# Patient Record
Sex: Male | Born: 1955 | Race: White | Hispanic: No | Marital: Married | Smoking: Never smoker
Health system: Southern US, Community
[De-identification: ages and names within clinical notes are randomized; demographics above are authoritative.]

## PROBLEM LIST (undated history)

## (undated) DIAGNOSIS — F419 Anxiety disorder, unspecified: Secondary | ICD-10-CM

## (undated) DIAGNOSIS — Z9289 Personal history of other medical treatment: Secondary | ICD-10-CM

## (undated) DIAGNOSIS — Q211 Atrial septal defect: Secondary | ICD-10-CM

## (undated) DIAGNOSIS — M1990 Unspecified osteoarthritis, unspecified site: Secondary | ICD-10-CM

## (undated) DIAGNOSIS — E785 Hyperlipidemia, unspecified: Secondary | ICD-10-CM

## (undated) DIAGNOSIS — I2510 Atherosclerotic heart disease of native coronary artery without angina pectoris: Secondary | ICD-10-CM

## (undated) DIAGNOSIS — E039 Hypothyroidism, unspecified: Secondary | ICD-10-CM

## (undated) DIAGNOSIS — I82409 Acute embolism and thrombosis of unspecified deep veins of unspecified lower extremity: Secondary | ICD-10-CM

## (undated) DIAGNOSIS — I209 Angina pectoris, unspecified: Secondary | ICD-10-CM

## (undated) DIAGNOSIS — F909 Attention-deficit hyperactivity disorder, unspecified type: Secondary | ICD-10-CM

## (undated) DIAGNOSIS — I10 Essential (primary) hypertension: Secondary | ICD-10-CM

## (undated) DIAGNOSIS — N183 Chronic kidney disease, stage 3 unspecified: Secondary | ICD-10-CM

## (undated) DIAGNOSIS — K219 Gastro-esophageal reflux disease without esophagitis: Secondary | ICD-10-CM

## (undated) DIAGNOSIS — R569 Unspecified convulsions: Secondary | ICD-10-CM

## (undated) DIAGNOSIS — G629 Polyneuropathy, unspecified: Secondary | ICD-10-CM

## (undated) DIAGNOSIS — Z86711 Personal history of pulmonary embolism: Secondary | ICD-10-CM

## (undated) DIAGNOSIS — G060 Intracranial abscess and granuloma: Secondary | ICD-10-CM

## (undated) DIAGNOSIS — Z8739 Personal history of other diseases of the musculoskeletal system and connective tissue: Secondary | ICD-10-CM

## (undated) DIAGNOSIS — G4730 Sleep apnea, unspecified: Secondary | ICD-10-CM

## (undated) DIAGNOSIS — Q2112 Patent foramen ovale: Secondary | ICD-10-CM

## (undated) HISTORY — DX: Anxiety disorder, unspecified: F41.9

## (undated) HISTORY — PX: CRANIOTOMY: SHX93

## (undated) HISTORY — DX: Attention-deficit hyperactivity disorder, unspecified type: F90.9

## (undated) HISTORY — PX: PATENT FORAMEN OVALE CLOSURE: SHX2181

## (undated) HISTORY — PX: ULNAR TUNNEL RELEASE: SHX820

## (undated) HISTORY — DX: Acute embolism and thrombosis of unspecified deep veins of unspecified lower extremity: I82.409

## (undated) HISTORY — PX: ANTERIOR CRUCIATE LIGAMENT REPAIR: SHX115

## (undated) HISTORY — DX: Essential (primary) hypertension: I10

## (undated) HISTORY — PX: BRAIN SURGERY: SHX531

## (undated) HISTORY — PX: OTHER SURGICAL HISTORY: SHX169

## (undated) HISTORY — PX: CARPAL TUNNEL RELEASE: SHX101

## (undated) HISTORY — DX: Hyperlipidemia, unspecified: E78.5

## (undated) HISTORY — DX: Gastro-esophageal reflux disease without esophagitis: K21.9

## (undated) HISTORY — PX: FRACTURE SURGERY: SHX138

## (undated) HISTORY — DX: Personal history of pulmonary embolism: Z86.711

---

## 1998-09-06 ENCOUNTER — Emergency Department (HOSPITAL_COMMUNITY): Admission: EM | Admit: 1998-09-06 | Discharge: 1998-09-06 | Payer: Self-pay | Admitting: Emergency Medicine

## 1998-09-06 ENCOUNTER — Encounter: Payer: Self-pay | Admitting: Emergency Medicine

## 1998-10-17 ENCOUNTER — Encounter: Payer: Self-pay | Admitting: Internal Medicine

## 1998-10-17 ENCOUNTER — Emergency Department (HOSPITAL_COMMUNITY): Admission: EM | Admit: 1998-10-17 | Discharge: 1998-10-17 | Payer: Self-pay | Admitting: Internal Medicine

## 1999-12-12 ENCOUNTER — Encounter: Payer: Self-pay | Admitting: Family Medicine

## 1999-12-12 ENCOUNTER — Encounter: Admission: RE | Admit: 1999-12-12 | Discharge: 1999-12-12 | Payer: Self-pay | Admitting: Family Medicine

## 2003-12-02 ENCOUNTER — Emergency Department (HOSPITAL_COMMUNITY): Admission: EM | Admit: 2003-12-02 | Discharge: 2003-12-03 | Payer: Self-pay | Admitting: Emergency Medicine

## 2003-12-26 ENCOUNTER — Observation Stay (HOSPITAL_COMMUNITY): Admission: EM | Admit: 2003-12-26 | Discharge: 2003-12-27 | Payer: Self-pay | Admitting: Emergency Medicine

## 2004-06-16 ENCOUNTER — Ambulatory Visit (HOSPITAL_COMMUNITY): Admission: RE | Admit: 2004-06-16 | Discharge: 2004-06-16 | Payer: Self-pay | Admitting: Allergy

## 2005-02-27 ENCOUNTER — Ambulatory Visit: Payer: Self-pay | Admitting: Internal Medicine

## 2005-03-09 ENCOUNTER — Ambulatory Visit (HOSPITAL_COMMUNITY): Admission: RE | Admit: 2005-03-09 | Discharge: 2005-03-09 | Payer: Self-pay | Admitting: Internal Medicine

## 2005-03-19 ENCOUNTER — Ambulatory Visit (HOSPITAL_BASED_OUTPATIENT_CLINIC_OR_DEPARTMENT_OTHER): Admission: RE | Admit: 2005-03-19 | Discharge: 2005-03-19 | Payer: Self-pay | Admitting: Internal Medicine

## 2005-03-22 ENCOUNTER — Ambulatory Visit: Payer: Self-pay | Admitting: Internal Medicine

## 2005-04-02 ENCOUNTER — Ambulatory Visit: Payer: Self-pay | Admitting: Internal Medicine

## 2005-06-22 ENCOUNTER — Ambulatory Visit: Admission: RE | Admit: 2005-06-22 | Discharge: 2005-06-22 | Payer: Self-pay | Admitting: Orthopedic Surgery

## 2005-08-20 ENCOUNTER — Ambulatory Visit: Payer: Self-pay | Admitting: Internal Medicine

## 2005-09-03 ENCOUNTER — Ambulatory Visit (HOSPITAL_COMMUNITY): Admission: RE | Admit: 2005-09-03 | Discharge: 2005-09-04 | Payer: Self-pay | Admitting: Orthopedic Surgery

## 2005-12-19 ENCOUNTER — Encounter: Payer: Self-pay | Admitting: Emergency Medicine

## 2005-12-20 ENCOUNTER — Inpatient Hospital Stay (HOSPITAL_COMMUNITY): Admission: EM | Admit: 2005-12-20 | Discharge: 2005-12-30 | Payer: Self-pay | Admitting: Emergency Medicine

## 2005-12-22 ENCOUNTER — Ambulatory Visit: Payer: Self-pay | Admitting: Infectious Diseases

## 2005-12-22 ENCOUNTER — Encounter (INDEPENDENT_AMBULATORY_CARE_PROVIDER_SITE_OTHER): Payer: Self-pay | Admitting: Cardiology

## 2005-12-31 ENCOUNTER — Ambulatory Visit: Payer: Self-pay | Admitting: Pulmonary Disease

## 2005-12-31 ENCOUNTER — Inpatient Hospital Stay (HOSPITAL_COMMUNITY): Admission: EM | Admit: 2005-12-31 | Discharge: 2006-01-16 | Payer: Self-pay | Admitting: Emergency Medicine

## 2006-01-01 ENCOUNTER — Encounter (INDEPENDENT_AMBULATORY_CARE_PROVIDER_SITE_OTHER): Payer: Self-pay | Admitting: Cardiology

## 2006-01-07 ENCOUNTER — Encounter: Payer: Self-pay | Admitting: Vascular Surgery

## 2006-01-11 ENCOUNTER — Ambulatory Visit: Payer: Self-pay | Admitting: Hematology & Oncology

## 2006-01-12 ENCOUNTER — Encounter (INDEPENDENT_AMBULATORY_CARE_PROVIDER_SITE_OTHER): Payer: Self-pay | Admitting: Cardiology

## 2006-01-20 ENCOUNTER — Emergency Department (HOSPITAL_COMMUNITY): Admission: EM | Admit: 2006-01-20 | Discharge: 2006-01-20 | Payer: Self-pay | Admitting: Emergency Medicine

## 2006-01-29 ENCOUNTER — Ambulatory Visit (HOSPITAL_COMMUNITY): Admission: RE | Admit: 2006-01-29 | Discharge: 2006-01-29 | Payer: Self-pay | Admitting: Neurosurgery

## 2006-02-11 ENCOUNTER — Encounter: Admission: RE | Admit: 2006-02-11 | Discharge: 2006-05-12 | Payer: Self-pay | Admitting: Family Medicine

## 2006-04-22 ENCOUNTER — Ambulatory Visit (HOSPITAL_COMMUNITY): Admission: RE | Admit: 2006-04-22 | Discharge: 2006-04-22 | Payer: Self-pay | Admitting: Neurosurgery

## 2006-06-16 ENCOUNTER — Ambulatory Visit (HOSPITAL_COMMUNITY): Admission: RE | Admit: 2006-06-16 | Discharge: 2006-06-16 | Payer: Self-pay | Admitting: Family Medicine

## 2006-06-16 ENCOUNTER — Encounter: Payer: Self-pay | Admitting: Vascular Surgery

## 2006-07-19 ENCOUNTER — Ambulatory Visit (HOSPITAL_COMMUNITY): Admission: RE | Admit: 2006-07-19 | Discharge: 2006-07-19 | Payer: Self-pay | Admitting: Neurosurgery

## 2006-07-28 ENCOUNTER — Ambulatory Visit (HOSPITAL_COMMUNITY): Admission: RE | Admit: 2006-07-28 | Discharge: 2006-07-28 | Payer: Self-pay | Admitting: Neurosurgery

## 2006-10-20 ENCOUNTER — Ambulatory Visit (HOSPITAL_COMMUNITY): Admission: RE | Admit: 2006-10-20 | Discharge: 2006-10-20 | Payer: Self-pay | Admitting: Neurosurgery

## 2007-01-27 ENCOUNTER — Ambulatory Visit: Payer: Self-pay | Admitting: Family Medicine

## 2007-01-28 DIAGNOSIS — N183 Chronic kidney disease, stage 3 unspecified: Secondary | ICD-10-CM | POA: Insufficient documentation

## 2007-01-28 DIAGNOSIS — I10 Essential (primary) hypertension: Secondary | ICD-10-CM | POA: Insufficient documentation

## 2007-01-28 DIAGNOSIS — G4733 Obstructive sleep apnea (adult) (pediatric): Secondary | ICD-10-CM | POA: Insufficient documentation

## 2007-02-02 ENCOUNTER — Encounter (INDEPENDENT_AMBULATORY_CARE_PROVIDER_SITE_OTHER): Payer: Self-pay | Admitting: *Deleted

## 2007-03-28 ENCOUNTER — Ambulatory Visit (HOSPITAL_COMMUNITY): Admission: RE | Admit: 2007-03-28 | Discharge: 2007-03-28 | Payer: Self-pay | Admitting: Neurosurgery

## 2007-05-15 IMAGING — CT CT HEAD W/O CM
3 of 5 series · 16 of 47 positions shown, 19 images · IV contrast (APPLIED)
Comparison: 12/24/05.

CLINICAL DATA: Shortness of breath.  Weakness. 
 HEAD CT WITHOUT CONTRAST:
TECHNIQUE: Contiguous axial images were obtained from the base of the skull through the vertex according to standard protocol without contrast.

[Series 9: pulm embolism 2.0 st · axial · 0.62mm/px · z∈[-652,-366]mm · 10 of 170 slices shown, 13 images (1 of 3)]
[im 14/170  brain]
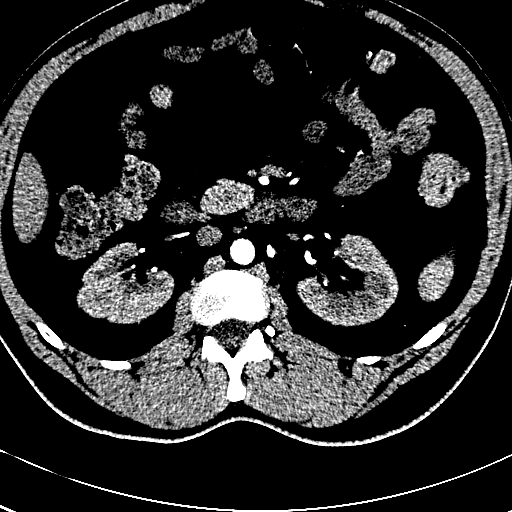
[im 14/170  bone]
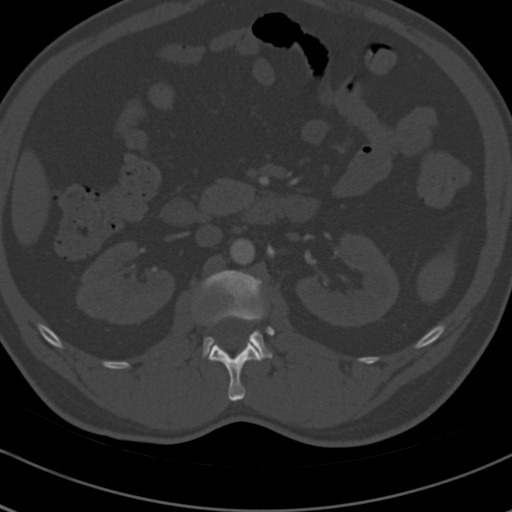
[im 27/170  brain]
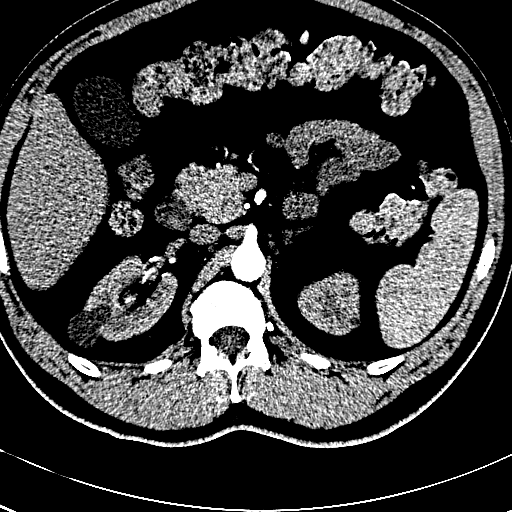
[im 53/170  brain]
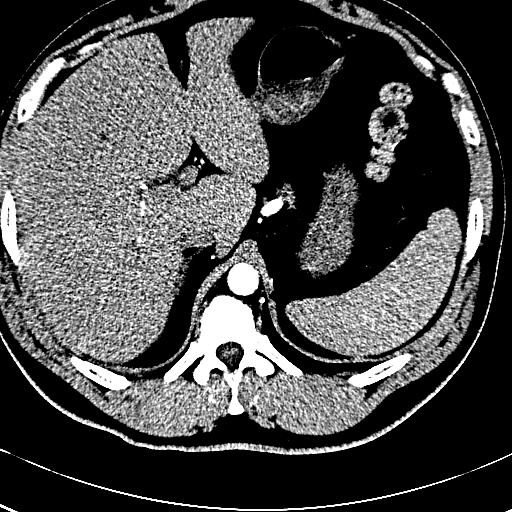
[im 66/170  brain]
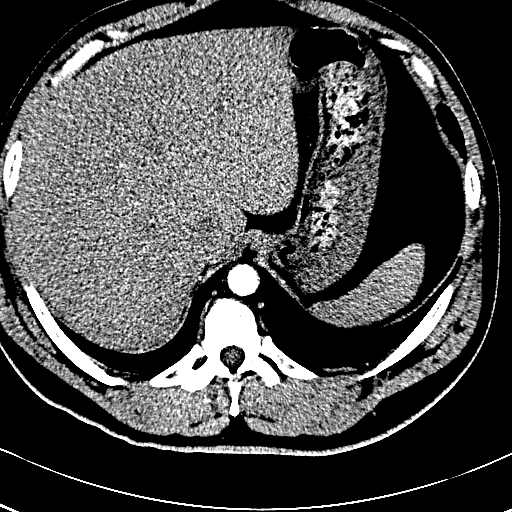
[im 79/170  brain]
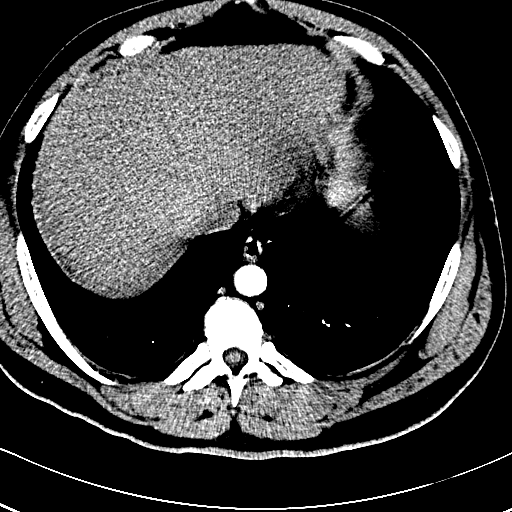
[im 79/170  bone]
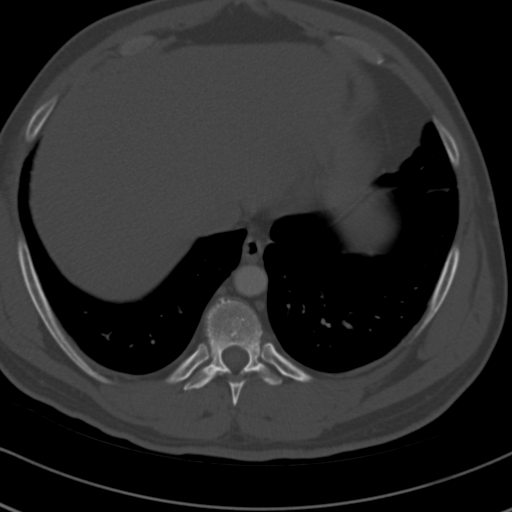
[im 92/170  brain]
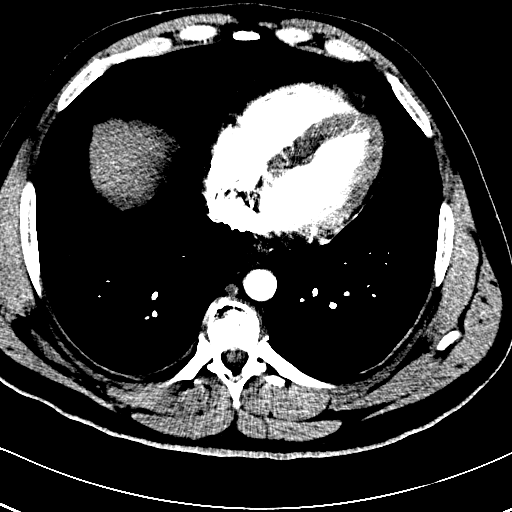
[im 105/170  brain]
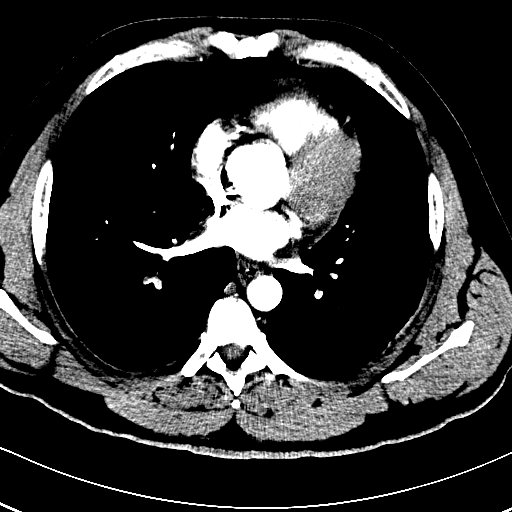
[im 131/170  brain]
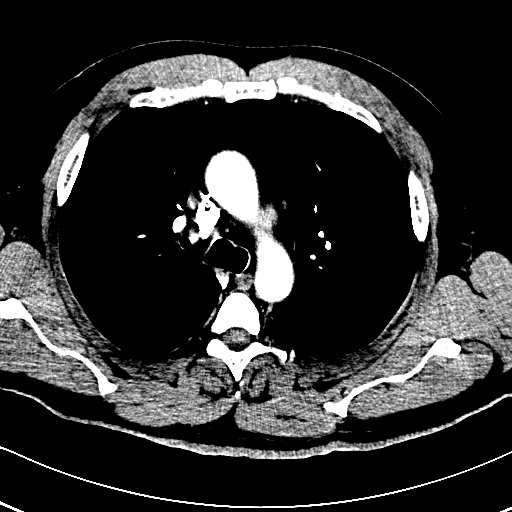
[im 144/170  brain]
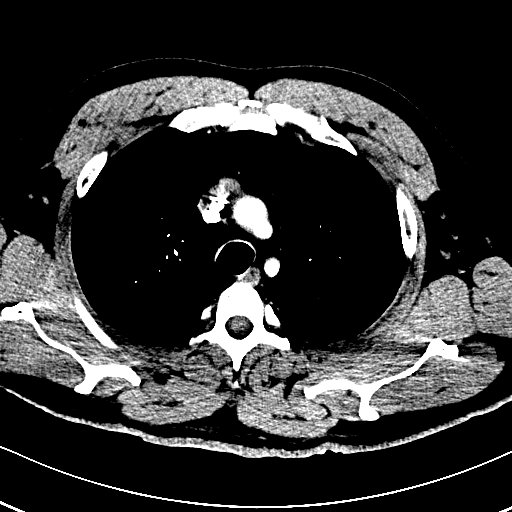
[im 144/170  bone]
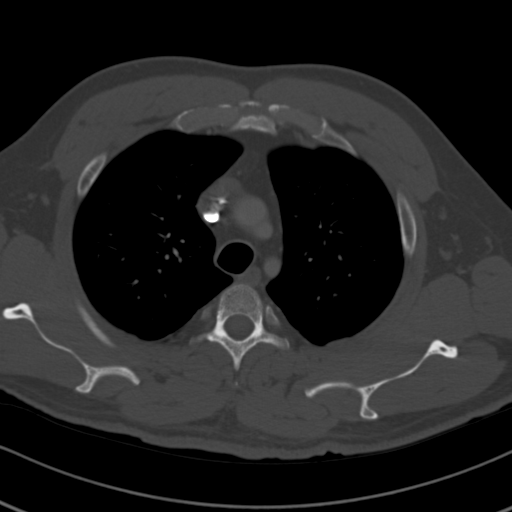
[im 157/170  brain]
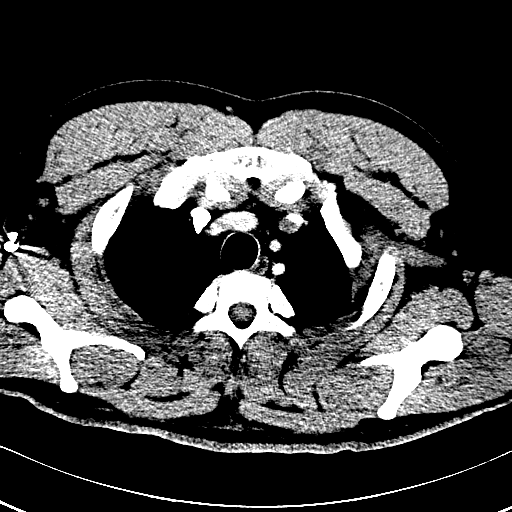

[Series 15: pulm embolism 2.0 st · coronal · 0.66mm/px · 3 of 129 slices shown (2 of 3)]
[im 43/129  brain]
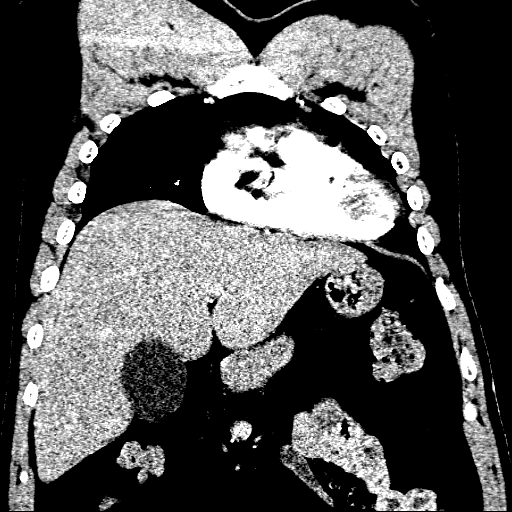
[im 57/129  brain]
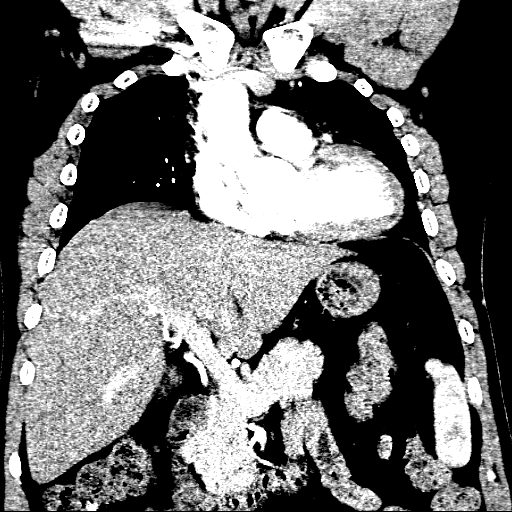
[im 72/129  brain]
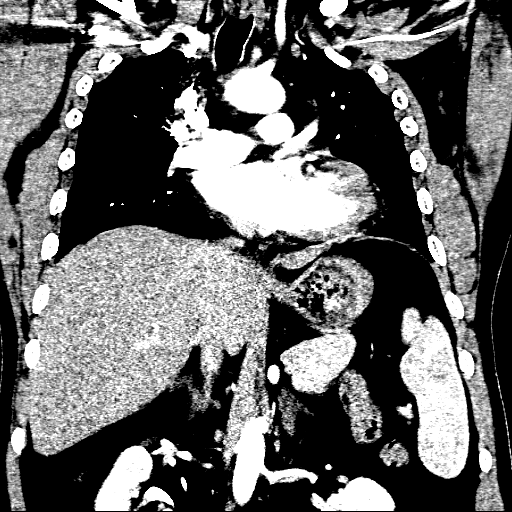

[Series 16: pulm embolism 2.0 st · sagittal · 0.66mm/px · 3 of 142 slices shown (3 of 3)]
[im 48/142  brain]
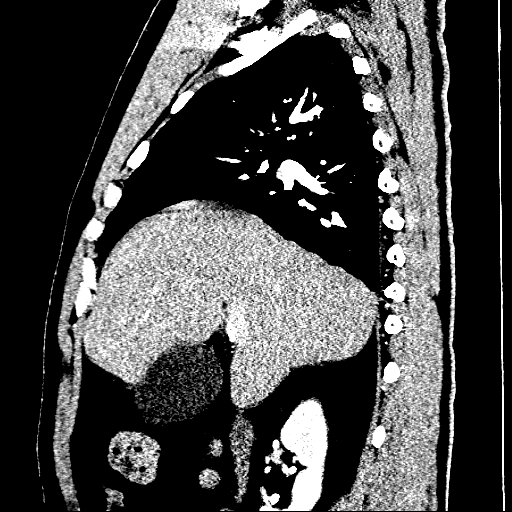
[im 71/142  brain]
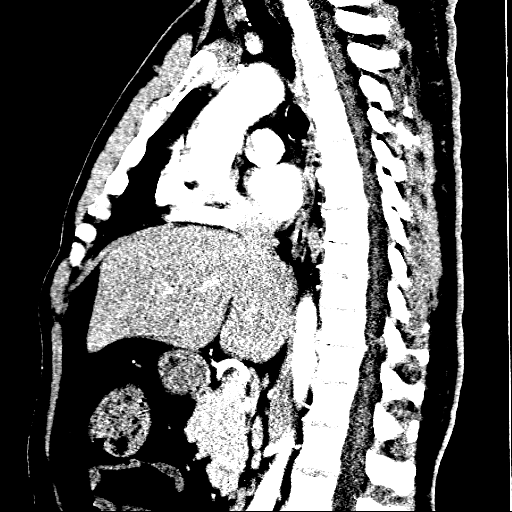
[im 95/142  brain]
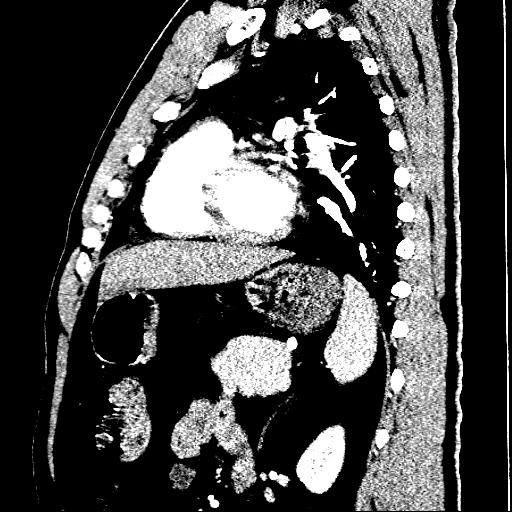

[16 of 47 positions shown; findings below may reference images not displayed]

FINDINGS: There is a mass within the right parietal lobe with surrounding low-density edema similar to the prior exam. There is mass effect upon the adjacent frontal and parietal lobe.  There is mild midline shift, also similar to prior exam.  The ventricular volumes are normal.  There is no evidence for intracranial hemorrhage.  No abnormal extraaxial fluid collections are noted.  The patient is status post right frontal craniotomy.  There is a small amount of pneumocephalus. 
 The mastoid air cells and the paranasal sinuses are normally aerated.  No new findings are identified.
IMPRESSION: 1.  Mass within the right frontal lobe with surrounding low attenuation consistent with edema is unchanged. 
 2.  No new findings.

## 2007-11-11 ENCOUNTER — Encounter: Admission: RE | Admit: 2007-11-11 | Discharge: 2007-11-11 | Payer: Self-pay | Admitting: Neurosurgery

## 2008-05-02 ENCOUNTER — Encounter: Admission: RE | Admit: 2008-05-02 | Discharge: 2008-05-02 | Payer: Self-pay | Admitting: Neurosurgery

## 2008-11-13 ENCOUNTER — Encounter: Admission: RE | Admit: 2008-11-13 | Discharge: 2008-11-13 | Payer: Self-pay | Admitting: Neurosurgery

## 2009-07-23 ENCOUNTER — Ambulatory Visit (HOSPITAL_COMMUNITY): Admission: RE | Admit: 2009-07-23 | Discharge: 2009-07-23 | Payer: Self-pay | Admitting: Neurosurgery

## 2009-09-20 ENCOUNTER — Ambulatory Visit: Payer: Self-pay | Admitting: Psychology

## 2009-10-02 ENCOUNTER — Ambulatory Visit: Payer: Self-pay | Admitting: Psychology

## 2009-10-04 ENCOUNTER — Ambulatory Visit: Payer: Self-pay | Admitting: Psychology

## 2009-10-18 ENCOUNTER — Ambulatory Visit: Payer: Self-pay | Admitting: Psychology

## 2009-12-24 ENCOUNTER — Ambulatory Visit: Payer: Self-pay | Admitting: Psychology

## 2010-02-05 ENCOUNTER — Ambulatory Visit (HOSPITAL_BASED_OUTPATIENT_CLINIC_OR_DEPARTMENT_OTHER): Admission: RE | Admit: 2010-02-05 | Discharge: 2010-02-05 | Payer: Self-pay | Admitting: Orthopedic Surgery

## 2010-03-10 ENCOUNTER — Ambulatory Visit: Payer: Self-pay | Admitting: Psychology

## 2010-03-24 ENCOUNTER — Ambulatory Visit: Payer: Self-pay | Admitting: Psychology

## 2010-03-28 ENCOUNTER — Ambulatory Visit (HOSPITAL_COMMUNITY): Admission: RE | Admit: 2010-03-28 | Discharge: 2010-03-28 | Payer: Self-pay | Admitting: Neurosurgery

## 2010-03-31 ENCOUNTER — Ambulatory Visit: Payer: Self-pay | Admitting: Psychology

## 2010-04-18 ENCOUNTER — Ambulatory Visit: Payer: Self-pay | Admitting: Psychology

## 2010-06-10 ENCOUNTER — Ambulatory Visit: Payer: Self-pay | Admitting: Psychology

## 2010-07-08 ENCOUNTER — Ambulatory Visit: Payer: Self-pay | Admitting: Psychology

## 2010-08-03 ENCOUNTER — Encounter: Payer: Self-pay | Admitting: Neurosurgery

## 2010-09-27 LAB — BASIC METABOLIC PANEL
Creatinine, Ser: 1.33 mg/dL (ref 0.4–1.5)
GFR calc Af Amer: >60 mL/min (ref >60)
GFR calc non Af Amer: 56 mL/min — ABNORMAL LOW (ref >60)
Potassium: 4.6 mEq/L (ref 3.5–5.1)
Sodium: 142 mEq/L (ref 135–145)

## 2010-09-28 LAB — CREATININE, SERUM
Creatinine, Ser: 1.2 mg/dL (ref 0.4–1.5)
GFR calc Af Amer: >60 mL/min (ref >60)
GFR calc non Af Amer: >60 mL/min (ref >60)

## 2010-10-10 ENCOUNTER — Ambulatory Visit (INDEPENDENT_AMBULATORY_CARE_PROVIDER_SITE_OTHER): Payer: BC Managed Care – PPO | Admitting: Psychology

## 2010-10-10 DIAGNOSIS — F411 Generalized anxiety disorder: Secondary | ICD-10-CM

## 2010-10-31 ENCOUNTER — Ambulatory Visit (INDEPENDENT_AMBULATORY_CARE_PROVIDER_SITE_OTHER): Payer: BC Managed Care – PPO | Admitting: Psychology

## 2010-10-31 DIAGNOSIS — F411 Generalized anxiety disorder: Secondary | ICD-10-CM

## 2010-11-14 ENCOUNTER — Ambulatory Visit (INDEPENDENT_AMBULATORY_CARE_PROVIDER_SITE_OTHER): Payer: BC Managed Care – PPO | Admitting: Psychology

## 2010-11-14 DIAGNOSIS — F411 Generalized anxiety disorder: Secondary | ICD-10-CM

## 2010-11-28 NOTE — H&P (Signed)
[name], [name]              ACCOUNT NO.:  [id number]   MEDICAL RECORD NO.:  [id number]          PATIENT TYPE:  INP   LOCATION:  1843                         FACILITY:  [facility]   PHYSICIAN:  Danae Orleans. Venetia Maxon, M.D.  DATE OF BIRTH:  02-Mar-1956   DATE OF ADMISSION:  12/31/2005  DATE OF DISCHARGE:                                HISTORY & PHYSICAL   REASON FOR ADMISSION:  Shortness of breath, tachycardia and home room air  saturation of 79%.   HISTORY OF ILLNESS:  [name] is a 55 year old man who was discharged  from the hospital yesterday after a craniotomy for brain abscess with  initial onset of seizure and left hand weakness.  The patient was sent home  today on Rocephin and Flagyl for brain abscess.  I was called tonight by his  home visiting nurse; the patient had a heart rate to 120 and a room air  saturation of 79% and that he looked quite ill.  I asked the patient to come  to the emergency room, as I felt that these vital signs were quite ominous  and concerning.  Workup in the emergency room included a chest x-ray, which  was read as negative, and a chest CT, which was negative for a pulmonary  embolus.  He currently has a saturation of 97% on non-rebreather.  The  patient feels like he has the flu.  He has a history of asthma and sleep  apnea.  HIV workup was negative.  He has improving neurological status with  improving head CT and left hand function, as he was plegic in his hand  initially after onset of seizure and subsequently has been recovering  function, but still has weakness in his hand.   PHYSICAL EXAMINATION:  GENERAL:  On examination, the patient is awake, alert  and conversant.  He feels somewhat better on oxygen.  VITAL SIGNS:  His temperature is 98.5, blood pressure of 113/89, pulse of  113, respiratory rate of 18.  LUNGS:  Clear to auscultation, no wheezing and no stridor.  HEART:  Tachycardic without murmur or other abnormality.  EXTREMITIES:  He  does not appear to have peripheral edema in his lower  extremities and no edema, clubbing or cyanosis in the lower extremities and  no significant pallor in his lower extremities.   IMPRESSION:  [name] is a 55 year old man with hypoxic at home at  rest without evidence of pulmonary embolus or pneumonia.  He is status post  craniotomy for brain abscess.  He is at home on intravenous antibiotics.  I  spoke with Dr. Sung Amabile, who recommended admission to the neurological  stepdown unit and pulmonary critical care consult to assess the patient's  respiratory status.  He also felt that he should receive some diuresis and I  ordered Lasix per his recommendation along with potassium replacement.      Danae Orleans. Venetia Maxon, M.D.  Electronically Signed     JDS/MEDQ  D:  12/31/2005  T:  12/31/2005  Job:  [id number]

## 2010-11-28 NOTE — H&P (Signed)
NAME:  [name], [name]                        ACCOUNT NO.:  [id number]   MEDICAL RECORD NO.:  [id number]                   PATIENT TYPE:  EMS   LOCATION:  ED                                   FACILITY:  [facility]   PHYSICIAN:  Hollice Espy, M.D.            DATE OF BIRTH:  June 09, 1956   DATE OF ADMISSION:  12/26/2003  DATE OF DISCHARGE:                                HISTORY & PHYSICAL   PRIMARY CARE PHYSICIAN:  Dr. Uvaldo Rising.   CHIEF COMPLAINT:  Shortness of breath.   HISTORY OF PRESENT ILLNESS:  This is a 55 year old white male with past  medical history of asthma and hypertension, who has been medically  noncompliant for the past few weeks and months with his asthma inhalers as  well as his Lisinopril and presents with decreased O2 saturations.  The  patient states over the last few weeks he has been noticing increased  dyspnea on exertion.  He became finally concerned and came to his primary  care physician's office, Dr. Uvaldo Rising, today.  At that time, he was noted to  have O2 saturation in the low to mid 80s.  He reported told Dr. Uvaldo Rising that  he had not been taking his medications for the last few months because he  did not think that he needed it.  The patient denied any wheezing, chest  pain, or palpitations.  He denied any cough or any other symptoms other than  just dyspnea on exertion.  The patient also stated he had not taken his  blood pressure medication Lisinopril for the last 10 days.  His blood  pressure today was noted to be 197/125.  The patient was transferred over  from Dr. Mickie Kay office to [facility] ER.   In the emergency room, he was noted initially to have an O2 saturation at  84% on room air with respirations of 36.  The patient was started on oxygen,  IV fluids, and labs were sent.  It was noted that he had no evidence of an  elevated white count or shift.  His ABG did show a PO2 of 64, and this was  on 2 liters of oxygen.  The rest of his lab work was  essentially  unremarkable.   The patient currently states he is feeling a little bit better.  He denies  any current wheezing, chest pain, palpitations.  He does feel slightly short  of breath with exertion.  He denies any dysphagia, denies any visual  changes, hematuria, dysuria, constipation, diarrhea, focal extremity pain,  or abdominal pain.   PAST MEDICAL HISTORY:  1. Asthma.  2. Hypertension.  3. History of patent foramen ovale.  4. Of note, the patient had a dental cleaning done four weeks ago and at     that time did not take the prophylactic antibiotics as he was supposed     to.   MEDICATIONS:  The patient is on  the following medications which he does not  take:  1. Lisinopril believed to be 5 mg daily.  2. Advair inhaler.  3. Albuterol MDI p.r.n.  4. Singulair 10 mg daily.  5. Adderall for ADHD.   ALLERGIES:  No known drug allergies   SOCIAL HISTORY:  He denies tobacco.  Alcohol, which he previously had a  problem with, but has been sober for many years.  He denies any drug use.   FAMILY HISTORY:  Hypertension.   PHYSICAL EXAMINATION:  VITAL SIGNS:  On admission, Temperature 98.6, blood  pressure 101, respirations 36, O2 saturation 84% on room, now up to 88% on  room air and 100% on 2 liters nasal cannula.  GENERAL: Alert and oriented x 3.  No apparent distress.  HEENT:  Normocephalic and atraumatic.  NECK:  No carotid bruits.  HEART:  Regular rate and rhythm, S1, S2.  LUNGS:  Clear to auscultation bilaterally.  ABDOMEN:  Soft, nontender, nondistended.  Positive bowel sounds.  EXTREMITIES:  No clubbing, cyanosis, or edema.   LABORATORY AND X-RAY DATA:  Chest x-ray unremarkable and shows no evidence  of any infiltrate.   White count 8.6, hemoglobin 17.2, hematocrit 48.6, MCV 88.1, platelet count  302.  He has 56% shift.  ABG 7.46, PCO2 36. PO2 64, bicarb 25.  O2  saturation 92%.  This is on 2 liters nasal cannula.  Sodium 135, potassium  3.3, chloride 106,  bicarb 26, BUN 13, creatinine 1.2, glucose 96, calcium  9.7.  AST 48, ALT 70.  The rest of LFTs were within normal limits.  His UA  is completely negative.   IMPRESSION AND PLAN:  This is a 55 year old white male with history of  asthma, hypertension, and medical noncompliance who presents with:  1. Asthma exacerbation.  His ABG is noted to be a PO2 in the 60s with O2     saturation of 88% now on room air.  Will given him nebulizers, IV Solu-     Medrol, and oxygen.  I hope he can be discharged in the morning, and     likely this is a mild asthma exacerbation.  He needs to continue,     however, his home medications of Singulair, albuterol, and Advair at     home.  2. Slightly elevated hemoglobin and hematocrit.  I do not think he has any     evidence of polycythemia vara nor does he smoke, so likely this is     secondary to concentration secondary to dehydration.  Will give him IV     fluids.  3. Hypertension.  Will put him on Lisinopril which is likely due to medical     noncompliance.  4. History of patent foramen ovale.  The patient had a dental cleaning four     weeks ago and did not take prophylactic antibiotics.  He has not fever or     elevated white count, so I see no reason to check an echocardiogram.  He     has no evidence of any murmur either way.  5. The patient has a history of alcohol abuse many years ago, and now he is     sober.  6. History of attention deficit hyperactivity disorder.  He is on a medicine     called Adderall.  At this time, I will not continue it, and the patient     does see psychiatry, Dr. Senaida Ores, who may continue with.  Hollice Espy, M.D.    SKK/MEDQ  D:  12/26/2003  T:  12/26/2003  Job:  [id number]   cc:   D.r.  Uvaldo Rising   Dr. Senaida Ores, psychiatry

## 2010-11-28 NOTE — Consult Note (Signed)
[name], STEEVES              ACCOUNT NO.:  [id number]   MEDICAL RECORD NO.:  [id number]          PATIENT TYPE:  INP   LOCATION:  3012                         FACILITY:  [facility]   PHYSICIAN:  Cristy Hilts. Jacinto Halim, MD       DATE OF BIRTH:  02/11/1956   DATE OF CONSULTATION:  01/05/2006  DATE OF DISCHARGE:                                   CONSULTATION   REASON FOR CONSULTATION:  PFO.  Please evaluate for closure.   HISTORY:  Mr. Neema Fluegge is a 55 year old gentleman with a history of  bronchial asthma, obstructive sleep apnea, and hypertension who was  admitted to the hospital on December 20, 2005 with weakness in his left hand.  He was found to have a right frontal abscess for which he underwent  craniotomy and abscess drainage.  He was discharged home on December 30, 2005  but was readmitted to the hospital with not feeling well and also oxygen  saturations around 79% at room air.   He has had a prior history of PFO.  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   Because of his persistent desaturations in even minimal ambulation, but his  oxygenation is very well preserved during rest and lying down position, he  was referred to me for evaluation.   The patient at this time denies any chest pain, denies shortness of breath,  denies any paroxysmal nocturnal dyspnea or orthopnea.  He states that he is  unable to walk, as he gets extremely tired and short of breath even with  minimal exertional activities.   REVIEW OF SYSTEMS:  He has felt generally weak since his surgery.  He has  not had any bowel or bladder deficits.  He has not had any new neurological  deficits.  There is no recent weight gain, weight loss, other systen was  negative.   PRESENT MEDICATIONS:  1.  Protonix 40 mg once a day.  2.  Advair b.i.d.  3.  Oxycodone  b.i.d.  4.  Keppra 500 mg p.o. b.i.d.  5.  Rocephin 2 g q.12 h.  6.  Flagyl 500 mg IV q.6 h.  7.  He was on Dilantin, but it was stopped recently because of      thrombocytopenia.   ALLERGIES:  No known drug allergies.   PAST MEDICAL HISTORY:  1.  Bronchial asthma.  2.  Obstructive sleep apnea.  3.  Hypertension.   PAST SURGICAL HISTORY:  1.  He has had ACL left knee surgery about four months ago.  2.  He has had craniotomy on December 21, 2005.   SOCIAL HISTORY:  He is married.  Lives with his wife.  He does not smoke.  He does not drink alcohol.  He used to drink excessively previously.   FAMILY HISTORY:  There is no history of premature coronary artery disease in  the family or diabetes in the family.  PHYSICAL EXAMINATION:  GENERAL:  He is moderately built and nourished.  He  appears to be in no acute distress.  VITAL SIGNS:  Heart rate 104 beats per minute, regular; respirations 14;  blood pressure 122/81 mmHg; temperature 99 degrees Fahrenheit.  HEART:  S1 and S2 were normal.  There was no gallop or murmur.  CHEST:  Bilaterally full breath sounds.  No crackles.  ABDOMEN:  Benign.  EXTREMITIES:  No edema.  PERIPHERAL VASCULAR:  Normal.  There is no JVD.   EKG demonstrated sinus tachycardia.   PERTINENT FINDINGS:  His BUN was 12, creatinine 1, potassium 4.1.  His  hemoglobin was 14.1, hematocrit 40.9, white count 12.2, with a platelet  count of 88,000.  EKG demonstrates sinus tachycardia.   IMPRESSION:  1.  Platypnea orthodeoxia syndrome (PODS).  2.  Patent foramen ovale, with large atrial septal aneurysm and overriding      of the aorta, but there is also spontaneous right-to-left shunting.      Strongly positive transcranial bubble study for right-to-left shunting.  3.  Thrombocytopenia probably related to Dilantin +/- Lovenox.  4.  Low-grade temperature.  5.  Resting tachycardia.   RECOMMENDATIONS:  The patient has classic PODS syndrome.  He will benefit  from  PFO closure.  However, I am concerned about the low platelets, low-  grade temperature, elevated white count.  Also the fact that he has received  antibiotics for less than 2 weeks since his brain abscess.  Hence, I am  skeptical of introducing foreign body into his heart.   Looking back, he probably had symptoms of PODS for years.  He was also  admitted in 2006 with shortness of breath.  At that time, his saturations  were in the 80s.  Hence, with this being a chronic issue, I would not  recommend closure on an urgent basis until his temperature and white counts  are more stable and his platelet counts are also stable.  I would also  expect him to receive at least 2-3 weeks of antibiotic therapy before I  proceed with the introduction of a foreign body into his heart.   I could time this electively next week earliest, or in two weeks latest.  This can also be done in an elective fashion.   The only indication for urgent closure would be acute respiratory distress  with inability to oxygenate.  However, these episodes are extremely rare in  PODS.   I had a lengthy discussion with the patient and his wife over the telephone  and at the beside.  I explained regarding PFO closure.  I explained  regarding less than 1% risk of death, stroke, heart attack, need for urgent  open heart surgery with PFO closure but not limited to these.  The patient  and wife understand, and they are willing to proceed.   The patient also has sinus tachycardia which is probably nonspecific.  However, because of patient and family concern, it is okay to transfer to  telemetry for closer monitoring.  I will also recommend checking a TSH.   I have given my contact information to the wife.  I will be available to  talk about any questions or concerns.  I left my contact numbers on the  chart.   Again, please note the procedure can be done in an elective fashion, and I do not see an urgency in doing the  procedure at this time given above  findings.   Thank you for  having involved me in this very interesting presentation.  If  you have any questions, please do not hesitate to contact me.      Cristy Hilts. Jacinto Halim, MD  Electronically Signed     JRG/MEDQ  D:  01/05/2006  T:  01/05/2006  Job:  [id number]   cc:   Danae Orleans. Venetia Maxon, M.D.  Fax: [phone]   Danice Goltz, M.D. [facility]  [address] [place], [place] [address]

## 2010-11-28 NOTE — H&P (Signed)
[name], [name]              ACCOUNT NO.:  [id number]   MEDICAL RECORD NO.:  [id number]          PATIENT TYPE:  EMS   LOCATION:  MAJO                         FACILITY:  [facility]   PHYSICIAN:  Coletta Memos, M.D.     DATE OF BIRTH:  1955/07/22   DATE OF ADMISSION:  12/20/2005  DATE OF DISCHARGE:                                HISTORY & PHYSICAL   ADMISSION DIAGNOSES:  Right frontal ring enhancing lesion with surrounding  edema and mass effect.   INDICATIONS FOR ADMISSION:  [name] is a gentleman who says for the last  few weeks, he has had increasing weakness in his left hand. 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 Anne Hahn. He says he improved and did much better and  that was about 6 months ago. Then, he started noticing some weakness again  in his left upper extremity. It is painless. It is frankly only in his hand.  Everything else appears quite good. He was brought into the hospital today  because 30 minutes prior to arrival, his wife noted that his left arm was  numb. He had left facial numbness and twitching. He was unable to remember  recent events. Had some slurred speech. It was felt that he had a seizure  and as a result of that, he was taken to the CT scanner, which the CT  scanner showed was a ring enhancing mass in the right frontal region with  significant surrounding edema. He continued to have the twitching in his  face that he states he was unable to control he says, but he never lost  consciousness. He has had the weakness in his hand over the last 3 weeks and  is has progressively gotten worse.   PAST MEDICAL HISTORY:  [name] otherwise has a history of attention  deficit disorder, hypertension, and some gastroesophageal reflux. He also  has a history of asthma.   MEDICATIONS:  He takes Nexium, Adderall, and Atacand/hydrochlorothiazide  pill.   SOCIAL HISTORY:  He lives with his wife.  He does not use illicit drugs. He  does not smoke. He does not use alcohol. 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 He is engaged in martial arts and is active physically.   FAMILY HISTORY:  Mother and father are both still alive and they are in  relatively good health.   PHYSICAL EXAMINATION:  GENERAL:  Currently, he is alert and oriented x4 and  answering all questions appropriately. Memory length, attention span, and  fund of knowledge are normal.  VITAL SIGNS:  He has some spikes in his blood pressure up to 200, which was  earlier but he is essentially in the 150's and high 90's. Pulse in the 70's.  Respiratory rate 20. Oxygen saturation is 98%.  NEUROLOGIC:  Speech is clear and fluent. Memory length, attention span, and  fund of knowledge are normal. He is well kempt and mildly anxious but in no  real distress.  HEENT:  Pupils are equal, round,  and reactive to light. Funduscopic  examination was normal. Full extraocular movements. Symmetric facial  sensation and movements. Hearing intact to voice bilaterally. Uvula midline.  Shoulder shrug is normal.  LUNGS:  Fields are clear.  HEART:  Regular rate and rhythm. No murmurs or rubs were appreciated.  ABDOMEN:  Soft, nontender. Bowel sounds present.  EXTREMITIES:  5 over 5 strength in the deltoids, biceps, triceps, and in his  right hand, both lower extremities normal strength. His left hand, he has  zero over 5 strength in the intrinsic's and grip. He has no proprioception  in his left hand, in the left fingers. He does have intact proprioception at  the wrist and elbow. On the right side, it is normal. Lower extremities are  normal. Reflexes 2+ at the biceps, triceps, knees, and ankles. No clonus, no  Hoffman's sign. Also noted he did have a symmetric smile. No cervical masses  or bruits. Pulses are very good at the wrists and feet bilaterally.   LABORATORY DATA:  CT was reviewed. It shows large edematous field around a   ring enhancing mass in the right frontal region. Ventricle is mildly effaced  on the right side. Basal cisterns are widely patent. Only one mass is  identified.   DIAGNOSIS:  Possible cerebral abscess.   PLAN:  What I will do is start [name] on Decadron. He is already loaded  with Dilantin at [facility]. He will clearly be monitored  for seizures but I would also like to obtain the MRI for better anatomic  characterization of the mass. He will be admitted to the Neurosurgical  Intensive Care Unit but as of now, no beds are available and he will  therefore remain here in the emergency room.           ______________________________  Coletta Memos, M.D.     KC/MEDQ  D:  12/20/2005  T:  12/20/2005  Job:  [id number]

## 2010-11-28 NOTE — Cardiovascular Report (Signed)
[name], [name]              ACCOUNT NO.:  [id number]   MEDICAL RECORD NO.:  [id number]          PATIENT TYPE:  INP   LOCATION:  3708                         FACILITY:  [facility]   PHYSICIAN:  Vonna Kotyk R. Jacinto Halim, MD       DATE OF BIRTH:  November 08, 1955   DATE OF PROCEDURE:  01/11/2006  DATE OF DISCHARGE:                              CARDIAC CATHETERIZATION   REFERRING PHYSICIAN:  1.  Coletta Memos, M.D.  2.  Pam Drown, M.D.  3.  Danice Goltz, M.D. [facility]   PROCEDURE PERFORMED:  1.  Intracardiac echocardiogram.  2.  Closure of the PFO with 28 mm CardioSeal septal occluder.   INDICATION:  [name] is a 55 year old Caucasian male who was  admitted to the hospital with significant orthostatic desaturations.  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  He also had  a large PFO with right-to-left shunting.  He has also developed acute DVT in  his right lower extremity.  He is at risk for recurrent cardioembolic  phenomenon.  Also, because of his significant desaturation he was felt to be  a candidate for PFO closure.   Having stabilized him without any clinical source of infection, patient was  brought to the catheterization suite on an elective basis.   INTRACARDIAC ECHOCARDIOGRAM DATA:  Left atrium.  The left atrium is normal.   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  There was a large PFO with  spontaneous right-to-left shunting.  The right-to-left shunting was both by  color Doppler and double contrast injection.   Right atrium.  The right atrium was small.   Left ventricle.  The left ventricle was normal.   Right ventricle.  The right ventricle was normal.   Mitral valve.  Mitral valve is normal.  There is minimal mitral  regurgitation.   Tricuspid valve.  Tricuspid valve is normal.  There is no significant  tricuspid regurgitation.   Aortic valve.  Aortic valve was normal.  The aortic root and the ascending  aorta was very  prominent.   INTERVENTIONAL DATA:  Successful closure of the PFO which measured out to be  1.2 to 1.3 cm.  This was closed with a 28 mm septal occluder, CardioSeal  septal occluder.  Post procedure complete closure was obtained with no color  flow or double contrast evidence of right-to-left shunting.   RECOMMENDATIONS:  Patient will be started on aspirin and Plavix and  continued on IV heparin.  He will be started on Coumadin and he will be  eventually discharged home on Plavix plus Coumadin.  He needs Plavix at  least for a period of 6 months.  He does need endocarditis prophylaxis for a  period of 6 months.   I discussed these findings with Dr. Coletta Memos.  He feels there is no  contraindication for anticoagulation.   TECHNIQUE OF THE PROCEDURE:  Under usual sterile precaution using an 8-  [place] right femoral venous access and a 9-French left femoral venous  access, an intracardiac echo probe was advanced to the venous left femoral  vein into the right  atrium.  Intracardiac echocardiogram was carefully  performed.  Then a 6-French multipurpose A2 catheter was utilized to perform  two cross wires from the right atrium into the left atrium through the PFO.  This was a difficult procedure as the patient had significant aneurysmal  formation of the interatrial septum and also because of changed anatomy  because of the unfolding of the aorta.  To find the right channel for the  PFO, to confirm it through intracardiac echo, was difficult; however, it was  successfully done without any complications.  Using heparin for  anticoagulation and maintaining ACT greater than 250, a 290 cmx0.035 Rosen  wire with a soft tipp was advanced into the left upper pulmonary vein and  positioned in the left upper pulmonary vein.  The multipurpose A2 catheter  was withdrawn out of the body.  Using a 25 mm PFO measuring balloon, the PFO  was measured and measured out to be about 1.2 to 1.3 cm both by  intracardiac  echo and by balloon measurement.  Then I decided to proceed with the  implantation of a 28 mm septal occluder.   The septal occluder was prepped and mounted in the usual fashion.  The  septal occluder was then advanced through the [facility] sheath, which is an 41-  J[place] sheath, that was exchanged from the right femoral venous 8-French  sheath.  The left atrial side of the device was released and under  ultrasound guidance and fluoroscopy guidance the device was gently pulled  back and abutting the intraatrial septum the right atrial side was then  released.  Excellent wall position was noted.  The intracardiac  echocardiogram with color flow was performed along with double contrast  bubble injection.  Then the septal occluder device was released.  Patient  tolerated the procedure well.  There was no immediate complication.  The  Mullins sheath was withdrawn and a short 11-French sheath was introduced  into the right femoral venous access and sutured in place and patient was  transferred to recovery in a stable condition.  Patient tolerated the  procedure well.  No immediate complications noted.      Cristy Hilts. Jacinto Halim, MD  Electronically Signed     JRG/MEDQ  D:  01/11/2006  T:  01/11/2006  Job:  [id number]   cc:   Coletta Memos, M.D.  Fax: [phone]   Pam Drown, M.D.  Fax: [phone]   Danice Goltz, M.D. [facility]  [address] [place], [place] [address]

## 2010-11-28 NOTE — Consult Note (Signed)
NAMELEWIS, KEATS              ACCOUNT NO.:  [id number]   MEDICAL RECORD NO.:  [id number]          PATIENT TYPE:  INP   LOCATION:  3012                         FACILITY:  [facility]   PHYSICIAN:  Rose Phi. Myna Hidalgo, M.D. DATE OF BIRTH:  Nov 20, 1955   DATE OF CONSULTATION:  01/04/2006  DATE OF DISCHARGE:                                   CONSULTATION   REFERRING PHYSICIAN:  Coletta Memos, M.D.   REASON FOR CONSULTATION:  1.  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  2.  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   HISTORY OF PRESENT ILLNESS:  [name] is a 55 year old white gentleman who  has been in good health.  He apparently was found to have a cerebral abscess  after undergoing a craniotomy a couple of weeks ago.  When he was admitted  at the time of initial surgery, he was found to have a platelet count of  302,000.  He initially was placed on Dilantin.   He underwent craniotomy.  Biopsies were taken.  The biopsies grew out  anaerobic bacteria.  He was subsequently discharged on IV Rocephin and  Flagyl.  He also was discharged on Dilantin.  He came back to the hospital  on the 21st.  He came in with shortness of breath and tachycardia.  He was  felt to have POD.   Of note, he was found to have a patent foramen ovale that apparently was  enlarging.   He was admitted with a platelet count of 120,000.  His platelet count  subsequently decreased down to 54,000.  He has normal hemoglobin.  White  cell count is okay.  We were asked to see him because of the progressive  thrombocytopenia.  He has had DIC panel which was unremarkable.  His sed  rate is normal.  He does have an elevated d-dimer which is of questionable  significance.  His cardiac enzymes have been minimally elevated.   He has had temperatures and cultures have been taken.  He does have a rash  on his back according to his wife.   We were again asked to see him because of the thrombocytopenia.   PAST MEDICAL HISTORY:  1.  Remarkable for obstructive sleep apnea.  2.  Hypertension.  3.  Gastroesophageal reflux disease.   ALLERGIES:  None.   ADMISSION MEDICATIONS:  1.  Hydrochlorothiazide 25 mg p.o. daily.  2.  Nexium 20 mg p.o. daily.  3.  Flagyl.  4.  Rocephin.  5.  Dilantin.   SOCIAL HISTORY:  Negative for tobacco use.  No alcohol.   REVIEW OF SYSTEMS:  Noncontributory.  There has been no bleeding or  bruising.  He has not noted any rash on his legs.   PHYSICAL EXAMINATION:  GENERAL:  This is a well-developed, well-nourished  white gentleman in no obvious distress.  VITAL SIGNS:  His vital signs show a temperature of 100, pulse is 113,  respiratory rate 20, blood pressure 123/85.  HEENT:  Normocephalic and atraumatic skull.  His craniotomy site is intact.  No bruising is noted around the craniotomy site.  There  is no adenopathy of  the neck.  He has no petechiae in his pharynx.  LUNGS:  Clear bilaterally.  CARDIOVASCULAR:  Regular rate and rhythm with normal S1 and S2.  There are  no murmurs, rubs or bruits.  ABDOMEN:  Soft with good bowel sounds.  There are no palpable abdominal  mass.  No palpable hepatosplenomegaly.  BACK:  No tenderness.  It was fine.  EXTREMITIES:  No clubbing, cyanosis, or edema.  He has decent strength  bilaterally.  SKIN:  Does not reveal an ecchymosis or petechiae.  Does have a faint  macular-type rash on his back.   LABORATORY DATA:  His peripheral smear shows normochromic, normocytic  population of red blood cells.  There is no nucleated red blood cells.  I  see no schistocytes.  There is no rouleaux formation.  His white cells  appear normal in morphology and maturation.  I do not see any immature  myeloid or lymphoid forms.  Platelets are decreased in number.  Platelets  appear to be adequate in size.   IMPRESSION:  [name] is a 55 year old gentleman with cerebral abscess.  He has a patent foramen ovale.  This needs to be fixed.   This apparently is  going to be fixed by catheter by Dr. Jacinto Halim.  Apparently, thrombocytopenia  clearly is time related to his medications.  I do not think that this was  related to any underlying hematologic malignancy or hematologic disorder.  It does not appear to be disseminated intravascular coagulation or  thrombotic thrombocytopenic purpura.  This certainly is not secondary to  sepsis.   Dilantin clearly is the primary culprit in my opinion.  I guess Rocephin and  Flagyl both could cause thrombocytopenia but the whole clinical scenario was  more consistent with Dilantin.  Apparently Dilantin has been stopped today.  It may take several days before we see his platelet count go back up.   If he needs to have his foramen ovale fixed, I would just transfuse him  platelets prior to the procedure or during the procedure.  He apparently is  getting platelets today for a platelet count of 54,000.  We will see what  kind of a rise he gets with his transfusion.   I certainly would avoid aspirin, nonsteroidals for right now.   I just think that patience is a virtue and that we need to be patient and  allow his Dilantin to get out of his system.   He obviously will need to be placed on another anticonvulsant.  Certainly,  there is no one better choice over another.  We will leave this to  neurosurgery to decide.   We will certainly follow along.  I will check his platelet count daily.      Rose Phi. Myna Hidalgo, M.D.  Electronically Signed     PRE/MEDQ  D:  01/04/2006  T:  01/05/2006  Job:  [id number]   cc:   Coletta Memos, M.D.  Fax: [phone]   Cristy Hilts. Jacinto Halim, MD  Fax: [phone]   Marcos Eke, M.D.

## 2010-11-28 NOTE — Discharge Summary (Signed)
[name], [name]              ACCOUNT NO.:  [id number]   MEDICAL RECORD NO.:  [id number]          PATIENT TYPE:  INP   LOCATION:  3020                         FACILITY:  [facility]   PHYSICIAN:  Coletta Memos, M.D.     DATE OF BIRTH:  07-Oct-1955   DATE OF ADMISSION:  12/20/2005  DATE OF DISCHARGE:  12/30/2005                                 DISCHARGE SUMMARY   ADMITTING DIAGNOSES:  1.  Seizures.  2.  Right frontal brain mass.   DISCHARGE DIAGNOSES:  1.  Right frontal cerebral abscess.  2.  Organisms are eubacterium limosum and Peptostreptococcus micros.   DISCHARGE STATUS:  Alive and well.   PROCEDURES:  Right frontal craniotomy for partial resection of abscess on  December 21, 2005.   COMPLICATIONS:  None.   INDICATIONS:  [name] presented after having motor seizures on the left  side.  He was admitted to hospital. Head CT showed a ring-enhancing mass in  the right frontal area.  He was taken to the operating room on hospital day  #1 where I encountered purulent material at the site of the mass.  It was  sent for Gram stain and culture, and on anaerobic cultures, it grew out  Eubacterium limosum  and Peptostreptococcus. The patient symptomatically had  a very weak left hand on presentation.  That has improved significantly  during his hospitalization.  He will continue to receive physical therapy  and occupational therapy.  His wound is clean and dry without signs of  infection.  The mental status is normal and has been normal throughout his  entire hospitalization.  His wound is clean and dry.  There are no signs of  infection.  He will be discharged home on Rocephin 2 grams IV q. 12 h and  Flagyl 500 mg IV q. 6 h for the next 6 weeks for a 6-week total therapy, and  it was started on December 23, 2005.  I will see him back in the office in  approximately 1 month.  He will also be sent home with Dilantin 100 mg p.o.  3 times a day, 100 tablets.     ______________________________  Coletta Memos, M.D.     KC/MEDQ  D:  12/30/2005  T:  12/30/2005  Job:  [id number]

## 2010-11-28 NOTE — Procedures (Signed)
NAME:  [name], [name]              ACCOUNT NO.:  [id number]   MEDICAL RECORD NO.:  [id number]          PATIENT TYPE:  OUT   LOCATION:  SLEEP CENTER                 FACILITY:  [facility]   PHYSICIAN:  Clinton D. Maple Hudson, M.D. DATE OF BIRTH:  [birth year]   DATE OF STUDY:  03/19/2005                              NOCTURNAL POLYSOMNOGRAM   REFERRING PHYSICIAN:  Dr. Jetty Duhamel.   DATE OF STUDY:  March 19, 2005.   INDICATION FOR STUDY:  Hypersomnia with sleep apnea. Epworth sleepiness  score 11/24, BMI 29. Weight 185 pounds.   SLEEP ARCHITECTURE:  Short total sleep time 257 minutes with sleep  efficiency 70%. Stage I was 20%, stage II 32%, stages III and IV 38%, REM  10% of total sleep time. Sleep latency 10 minutes, REM latency 212 minutes,  awake after sleep onset 97 minutes, arousal index 37. No bedtime medication  taken. He did not take his usual Adderall on the day of the study. He had  difficulty maintaining sleep.   RESPIRATORY DATA:  Apnea/hypopnea index (AHI, RDI) 40.6 obstructive events  per hour indicating moderately severe obstructive sleep apnea/hypopnea  syndrome. This included 31 central apneas and 78 obstructive apneas 27  hypopneas. Events were not positional. REM AHI 11.8. He was unable to  maintain sleep sufficiently to permit use C-PAP titration by split protocol  on the study night.   OXYGEN DATA:  Moderate snoring with oxygen desaturation to a nadir of 73%.  Mean oxygen saturation through the study was 91% on room air.   CARDIAC DATA:  Normal sinus rhythm with PVCs.   MOVEMENT/PARASOMNIA:  Insignificant leg jerks or sleep disturbance with no  unusual behavior.   IMPRESSION/RECOMMENDATIONS:  1.  Moderately severe obstructive sleep apnea/hypopnea syndrome,      apnea/hypopnea index  40.6 per hour with moderate snoring and oxygen desaturation to 73%.  1.  Consider return for C-PAP titration bringing a sleep medication.      Otherwise consider alternative  therapies.     Clinton D. Maple Hudson, M.D.  Diplomate, Biomedical engineer of Sleep Medicine  Electronically Signed    CDY/MEDQ  D:  03/22/2005 13:41:59  T:  03/23/2005 01:06:35  Job:  [id number]

## 2010-11-28 NOTE — Op Note (Signed)
NAMEWARD, BOISSONNEAULT              ACCOUNT NO.:  [id number]   MEDICAL RECORD NO.:  [id number]          PATIENT TYPE:  INP   LOCATION:  3108                         FACILITY:  [facility]   PHYSICIAN:  Coletta Memos, M.D.     DATE OF BIRTH:  07/08/1956   DATE OF PROCEDURE:  12/21/2005  DATE OF DISCHARGE:                                 OPERATIVE REPORT   PREOPERATIVE DIAGNOSIS:  Right frontal brain mass, ring enhancing   POSTOPERATIVE DIAGNOSIS:  Right frontal cerebral abscess.   SPECIMENS:  Purulent material sent for Gram's stain and culture.   COMPLICATIONS:  None.   SURGEON:  Coletta Memos, M.D.   ANESTHESIA:  General endotracheal.   PROCEDURES:  1.  Right frontal craniotomy for abscess evacuation.  2.  Frameless stereotactic-guided craniotomy for abscess drainage.   INDICATIONS:  Mr. Apollos Tenbrink is a 55 year old gentleman who presented to  the hospital with seizure activity on the left side of his body.  CTs and  subsequent MRI revealed a ring-enhancing lesion in the right frontal area.  Neurologically, he had profound weakness in his right hand and profound  sensory deficits.  I recommended and he agreed to undergo operative  resection of the mass.   PROCEDURE:  Mr. Shaff was brought to the operating room, intubated, and  placed under general anesthetic without difficulty.  He was rolled. He had a  Foley catheter placed without difficulty.  Three-pin Mayfield head holder  was applied, and he was secured to the bed with his head turned towards the  left.  I then prepped his head with a 6-minute shampoo using Hibiclens.  He  was then draped in sterile fashion.  I infiltrated 15 cc 0.5% lidocaine  1:200,000 strength epinephrine into the proposed incision site.  I made an  incision which was coronal starting above the ear, not extending to the  midline of the scalp.  The patient had had his preoperative findings placed  into the computer, and we localize to the frame prior to  making the incision  and draping the patient.  Once that was done, I again was able to show that  I had excellent visualization with the frameless stereotactic system.  I  then created a bone flap, using two bur holes and then turning a round flap.  Raney clips were used to secure the skin edges.   After turning the bone flap, I again used the Stealth and showed that again  I was where I wanted to be.  I then opened the dura using a cruciate  incision and exposing the brain at that area.  Four flaps were based  laterally from the middle of the incision.  I then again with the Stealth  made a small corticotomy and proceeded to dissect using bipolar dissectors  to where I thought the lesion was.  When I got down to that level, I  experience a quick egress of white creamy milky fluid.  I was not able to  discern at the base of this a hard mass.  I therefore assumed at that point  that I did  have an aneurysm.  I took multiple cultures.  I confirm my  location with the Stealth, and I was indeed within the center of the mass.  I then irrigated copiously.  I then closed the wound after achieving  hemostasis by using a piece of Duragen, as the brain was protruding outward  and I could not reapproximate the dural flaps.  I then  reapproximated the skull flap using plates and screws.  I then  reapproximated the scalp flap using 2-0 Vicryl sutures for the subgaleal  layer and staples for the skin edges.  Sterile dressing was applied.  Mr.  Stopa tolerated the procedure well after I removed the Mayfield head  holder.           ______________________________  Coletta Memos, M.D.     KC/MEDQ  D:  12/21/2005  T:  12/21/2005  Job:  [id number]

## 2010-11-28 NOTE — Discharge Summary (Signed)
[name], [name]              ACCOUNT NO.:  [id number]   MEDICAL RECORD NO.:  [id number]          PATIENT TYPE:  INP   LOCATION:  3708                         FACILITY:  [facility]   PHYSICIAN:  Danae Orleans. Venetia Maxon, M.D.  DATE OF BIRTH:  06-19-1956   DATE OF ADMISSION:  12/30/2005  DATE OF DISCHARGE:  01/16/2006                               DISCHARGE SUMMARY   REASON FOR ADMISSION:  1. Secundum atrial septal defect.  2. Respiratory failure.  3. 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  4. Venous embolism.  5. 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  6. Other convulsions.  7. Obstructive sleep apnea.  8. Adult attention deficit non-hyperactivity disorder.  9. Esophageal reflux.  10.Asthma without status asthmaticus.  11.Chronic pulmonary heart disease.  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  13.Adverse effect of anticoagulants.  14.Cardiac dysrhythmias.  15.Hypothyroidism.   FINAL DIAGNOSES:  1. Secundum atrial septal defect.  2. Respiratory failure.  3. 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  4. Venous embolism.  5. 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  6. Other convulsions.  7. Obstructive sleep apnea.  8. Adult attention deficit non-hyperactivity disorder.  9. Esophageal reflux.  10.Asthma without status asthmaticus.  11.Chronic pulmonary heart disease.  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  13.Adverse effect of anticoagulants.  14.Cardiac dysrhythmias.  15.Hypothyroidism.   HISTORY OF ILLNESS AND HOSPITAL COURSE:  [name] is a 55 year old  man who was recently discharged from hospital for craniotomy for what  turned out to be a brain abscess.  He came to the hospital on December 31, 2005 through the emergency room with a saturation of 79% and short of  breath.  He was admitted, seen in consultation by the critical care  medicine service, did not have evidence of pneumonia or evidence of  pulmonary embolism, and was  found to have an atrial septal defect which  was felt to be the basis for his hypoxemia and shortness of breath.  Desaturation was managed by the pulmonary service.  He was seen in  cardiology consultation and had repair of the atrial septal defect.  The  patient gradually improved.  His defect was planned to be closed on January 11, 2006.  The patient tolerated that procedure well.  He was continued  on the neurosurgery service.  He was heparinized, and he was also  evaluated by the infectious disease service.  The patient had developed  a pruritic rash which was felt to be secondary to Dilantin.  The patient  had repair of atrioseptal defect which was done endovascularly.  The  patient was started on Coumadin.  The patient's heparin was discontinued  on January 16, 2006, with instructions to follow up with Dr. Franky Macho in the  office.      Danae Orleans. Venetia Maxon, M.D.  Electronically Signed     JDS/MEDQ  D:  07/01/2006  T:  07/02/2006  Job:  [id number]

## 2010-11-28 NOTE — Consult Note (Signed)
[name], [name]              ACCOUNT NO.:  [id number]   MEDICAL RECORD NO.:  [id number]          PATIENT TYPE:  INP   LOCATION:  2906                         FACILITY:  [facility]   PHYSICIAN:  Pramod P. Pearlean Brownie, MD    DATE OF BIRTH:  11/26/1955   DATE OF CONSULTATION:  12/31/2005  DATE OF DISCHARGE:                                   CONSULTATION   REASON FOR REFERRAL:  Evaluate for right and left intracardiac pulmonary  shunt, in a patient with refractory hypoxemia, shortness of breath and  recent brain abscess and seizures.   HISTORY:  [name] is a 55 year old pleasant Caucasian male who was  recently diagnosed with a brain abscess following 5 seizures 2 weeks ago.  He underwent craniotomy for the same.  He had some severe left hand and  facial weakness, which seems to be improving.  The seizures are well  controlled on Dilantin.  He has been on Rocephin and Flagyl.  The patient  was admitted early this morning with shortness of breath, tachycardia and  saturations of 79% at home.  He was found to be tachycardic.  His lungs were  clear on auscultation, and primary critical care saw him.  The patient was  found to have hypoxemia (which was unexplained), and the question of a large  right-to-left shunt has been raised.  Hence a DC bubble study has been  requested.   PAST MEDICAL HISTORY:  1.  __________ , not on any medication for this.  2.  Hypertension.  3.  Obstructive sleep apnea.  4.  Gastroesophageal reflux disease.  5.  Recently diagnosed brain abscess, status post craniotomy.  6.  Seizures.   CURRENT MEDICATIONS:  1.  Rocephine.  2.  Flagyl.  3.  Dilantin.  4.  Inderal.  5.  Atacand.  6.  Hydrochlorothiazide.   PHYSICAL EXAMINATION:  GENERAL:  Reveals a pleasant, elderly looking  Caucasian male who is not in distress.  VITAL SIGNS:  Afebrile.  Pulse 87.  Respirations regular.  .  Distal pulses  are well felt.  HEENT:  Head is atraumatic. ENT examination  unremarkable.  NECK:  Supple without bruit.  CARDIAC:  Regular heart sounds.  NEUROLOGIC:  Patient is awake, alert, oriented.  Normal speech and language  function.  There is no aphasia or __________.  Pupils are equal and react to  movement at full range without nystagmus.  There is middle and left lower  facial asymmetry.  There is no apraxia or __________.  He has minimum  weakness of left grip and into the  muscles.  __________ right lower and  left upper extremity.  He has good symmetric strength in the lower  extremities.  Distal pulses are 2+; plantar is downgoing.  He has mild  subjective sensory loss in the left hand on the lower aspect.   DATA:  Reviewed today on admission labs revealed normal electrolytes.  The  bilirubin count is slightly elevated at 14.2.   CHEST X-RAY:  No active infiltrate.   CT SCAN:  CT scan of the chest no evidence of pulmonary  embolism.   IMPRESSION:  A 55 year old gentleman with refractory hypoxemia, possible  pulmonary shunt and with know history of possible PA fluid; who had  __________  transient upper and lower strength.  To look for __________ .   PROCEDURE:  After obtaining informed consent from the patient, who was  explained the risk and benefits -- a transesophageal echocardiogram bubble  study was performed at the bedside.  Due to technical difficulty, the  __________  artery was __________ in the right forearm.  I did a saline  injection and __________  immediately within a few seconds resulted in  multiple high intensity transient signals, which were too numerous to be  counted. __________  a Curtin sign was noted during the __________  phase.   IMPRESSION:  Strongly positive __________  bubble study, indicative of right-  to-left intracardiac or pulmonary shunt.  The patient does give me a known  history of PFO, which he has known for a long time.  At the present time I  am unsure how to clinically correlate the 55 patient's symptoms at  his known  shunt.  If the history of patent foramen ovale is not confirmed, then  transesophageal echocardiogram may be useful to further localize the shunt.  However, I would recommend checking lower extremity venous Doppler for deep  vein thrombosis; and if this is found, the patient may need to be on  anticoagulation.  Continue the Dilantin for his seizures at 15-20 mg  presently.           ______________________________  Sunny Schlein. Pearlean Brownie, MD     PPS/MEDQ  D:  12/31/2005  T:  12/31/2005  Job:  [id number]

## 2010-11-28 NOTE — Op Note (Signed)
NAMEJACOBY, [name]              ACCOUNT NO.:  [id number]   MEDICAL RECORD NO.:  [id number]          PATIENT TYPE:  OIB   LOCATION:  5035                         FACILITY:  [facility]   PHYSICIAN:  [name], M.D.  DATE OF BIRTH:  09/15/1955   DATE OF PROCEDURE:  09/03/2005  DATE OF DISCHARGE:  09/04/2005                                 OPERATIVE REPORT   PREOP DIAGNOSIS:  1.  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   POSTOPERATIVE DIAGNOSES:  1.  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  2.  Left knee medial meniscus tear.  3.  Chondromalacia medial femoral condyle.  4.  Chondromalacia of the patellofemoral joint.   PROCEDURE:  1.  Left knee examination under anesthesia.  2.  Left knee diagnostic arthroscopy.  3.  Allograft ACL reconstruction utilizing a bone-patella tendon-bone      allograft.  4.  Partial medial meniscectomy.  5.  Chondroplasty of the medial femoral condyle.  6.  Chondroplasty of the patella and trochlear groove.   SURGEON OF RECORD:  [name].   ASSISTANT:  Leroy Bathe PA-C.   ANESTHESIA:  LMA general as well as a femoral nerve block.   TOURNIQUET TIME:  Approximately 1 hour and 30 minutes.   ESTIMATED BLOOD LOSS:  Minimal.   DRAINS:  None.   HISTORY:  [name] is a 55 year old gentleman who injured his left knee  participating in martial arts and subsequently has developed pain and  sensations of instability with ongoing functional limitations. An  examination shows a positive Lachman with an MRI scan confirming a complete  rupture of the ACL. Due to his ongoing pain and functional limitations and  symptomatic instability. He is brought to the operative room at this time  for planned left knee reconstruction as described below.   Preoperatively counseled [name] on treatment options as well as risks  versus benefits thereof. Possible surgical complications of bleeding,  infection, neurovascular  injury, DVT, PE, arthrofibrosis, persistent pain,  loss of motion, recurrent instability and possible need for additional  surgery were reviewed. He understands and accepts and agrees for planned  procedure.   PROCEDURE IN DETAIL:  After undergoing routine preop evaluation, the patient  received prophylactic antibiotics. The patient brought the operating room  and placed supine operating table.  Underwent smooth induction of an LMA  general anesthesia. Femoral nerve block was then established left lower  extremity by the anesthesia department. Had received prophylactic  antibiotics. Tourniquet applied left thigh, left leg was sterilely prepped  and draped in standard fashion.  On the back table a patellar tendon  allograft was thawed and trimmed to fit through 10 mm tunnels. This point  the extremity was then exsanguinated with tourniquet inflated to 350 mmHg.  Standard arthroscopy portal established and diagnostic arthroscopy was  performed. The suprapatellar pouch and gutters were clear. The  patellofemoral joint showed broad grade II and III chondromalacia of the  superior half of the patella as well as the distal trochlear groove. These  areas were debrided with a shaver to stable cartilaginous base. There  was no  obvious full-thickness cartilage loss but there were significant chondral  flaps and chondral damage.  Intercondylar notch showed complete rupture of  the ACL and remnant was removed with a shaver. PCL was intact. Medially  there was broad grade 3 chondromalacia with large chondral flaps in the  medial femoral condyle. This was all debrided down to stable cartilage,  debrided down to stable cartilaginous base with shaver. There was a  degenerative tear of the mid third of the medial meniscus was also turned  back to stable margin with shaver. Laterally the articular surfaces were  good condition. The meniscus was probed and found be stable. This point a  notchplasty was  performed with an osteotome then completed with a bur back  to the over top position. All residual bony debris was meticulously removed.  A 3 cm incision was made over the proximal medial tibia down to the cortex  of the tibia and then a tibial guide was introduced and brought a guide pin  up to the footprint of the ACL at 60 degrees angle. This then overdrilled 10  mm reamer. A femoral guide was then passed and guide pin directed up into  the distal femur at the 1 o'clock position and then drilled with a reamer  confirming the proper thickness of the posterior tunnel wall. All residual  bony 3 debris was removed. The 2-pin passer was then directed to the tibial  and femoral tunnels and brought out through a stab wound on the  anterolateral thigh. Flexible guidewire was then passed and the 2-pin passer  was then used pass the graft into position. Graft was pulled in position  obtaining excellent interference fit both proximally, distally.  An 8 x 25  mm Bio interference screw was then passed over the guidewire up in the  femoral tunnel obtaining excellent interference fit. The knee was then taken  through a range of motion showed __________ positioning of the graft and no  evidence for impingement in either full flexion or full extension. The graft  then placed into 30 degrees of flexion. We initially passed a single 8 x 25  mm Bio interference screw but on intraoperative Lachman and pivot shift the  graft became lax. On inspection the fixation in the tibia had failed as we  removed this interference screw and then tried to perform a double stack  with two interference screws but unfortunately this construct also showed  failure on Lachman testing. This point we then progressed to the use of a  4.5 cortical screw post and this was then placed with the suture limbs from  the tibial bone plug wrapped around it multiple times and the screw was then terminally tensioned and this then allowed  excellent fixation and tension of  the graft. We then went ahead and replaced an additional Bio interference  screw into the tibial tunnel for supplemental fixation. This point  intraoperative Lachman was let negative. The graft was probed and shown to  have good tension. Final irrigation was completed.  Fluid and instruments  removed. Anterior incision was closed 2-0 Vicryl and 3-0 Monocryl as were  the portals. A combination of 0.25% Marcaine with epinephrine and 4  milligrams morphine instilled into the joint. Knee with wrap was wrapped in  Ace bandage with bulky dry dressing, Ace bandage, thigh high support  stocking and knee immobilizer. The tourniquet was then let down. The patient  was extubated and taken to the recovery room in stable  condition.      [name], M.D.  Electronically Signed     KMS/MEDQ  D:  09/03/2005  T:  09/04/2005  Job:  [id number]

## 2010-11-28 NOTE — Procedures (Signed)
EEG NUMBER:  [id number].   HISTORY:  This is a 55 year old patient who is being evaluated for possible  focal seizure, status post surgery in the right frontal area.  This is a  routine EEG.  A right frontotemporal skull defect was noted.   MEDICATIONS:  Nexium, Adderall, Atacand.   EEG CLASSIFICATION:  Normal weight.   DESCRIPTION:  This recording consists of a fairly well modulated, medium  amplitude Alpha rhythm of 9 Hz that reacts to eye opening and closure.  As  the record progresses, patient appears to remain in the waking state  throughout the recording.  Photic stimulation is performed resulting in a  minimal but bilateral photic driving response.  Hyperventilation is also  performed resulting in the minimal buildup of the background rhythm  activities without significant slowing seen.  At no time during the  recording does there appear to be evidence of spikes or spike-wave  discharges or evidence of focal slowing.  EKG monitor shows no evidence of  cardiac rhythm abnormalities with a heart rate of 66.   IMPRESSION:  This is a normal EEG recording in the waking state.  No  evidence of ictal or interictal discharges were seen.      Marlan Palau, M.D.  Electronically Signed     [id number]  D:  12/28/2005 19:22:35  T:  12/29/2005 13:04:25  Job #:  [id number]

## 2010-12-18 ENCOUNTER — Ambulatory Visit: Payer: BC Managed Care – PPO | Admitting: Psychology

## 2011-04-02 ENCOUNTER — Other Ambulatory Visit (HOSPITAL_COMMUNITY): Payer: Self-pay | Admitting: Neurosurgery

## 2011-04-02 DIAGNOSIS — D332 Benign neoplasm of brain, unspecified: Secondary | ICD-10-CM

## 2011-04-16 ENCOUNTER — Ambulatory Visit (HOSPITAL_COMMUNITY)
Admission: RE | Admit: 2011-04-16 | Discharge: 2011-04-16 | Disposition: A | Discharge status: Home / Self Care | Payer: BC Managed Care – PPO | Source: Ambulatory Visit | Attending: Neurosurgery | Admitting: Neurosurgery

## 2011-04-16 DIAGNOSIS — D332 Benign neoplasm of brain, unspecified: Secondary | ICD-10-CM | POA: Insufficient documentation

## 2011-04-16 DIAGNOSIS — G9389 Other specified disorders of brain: Secondary | ICD-10-CM | POA: Insufficient documentation

## 2011-04-16 LAB — CREATININE, SERUM
Creatinine, Ser: 1.26 mg/dL (ref 0.50–1.35)
GFR calc non Af Amer: 63 mL/min — ABNORMAL LOW (ref >90)

## 2011-04-16 MED ORDER — GADOBENATE DIMEGLUMINE 529 MG/ML IV SOLN
20.0000 mL | Freq: Once | INTRAVENOUS | Status: AC | PRN
  Administered 2011-04-16: 18 mL via INTRAVENOUS

## 2011-11-19 ENCOUNTER — Emergency Department (HOSPITAL_COMMUNITY): Payer: 59

## 2011-11-19 ENCOUNTER — Observation Stay (HOSPITAL_COMMUNITY)
Admission: EM | Admit: 2011-11-19 | Discharge: 2011-11-21 | Disposition: A | Discharge status: Home / Self Care | Payer: 59 | Attending: General Surgery | Admitting: General Surgery

## 2011-11-19 ENCOUNTER — Encounter (HOSPITAL_COMMUNITY): Payer: Self-pay | Admitting: Family Medicine

## 2011-11-19 DIAGNOSIS — Z86718 Personal history of other venous thrombosis and embolism: Secondary | ICD-10-CM | POA: Insufficient documentation

## 2011-11-19 DIAGNOSIS — Q619 Cystic kidney disease, unspecified: Secondary | ICD-10-CM | POA: Insufficient documentation

## 2011-11-19 DIAGNOSIS — K3580 Unspecified acute appendicitis: Secondary | ICD-10-CM

## 2011-11-19 DIAGNOSIS — K37 Unspecified appendicitis: Secondary | ICD-10-CM

## 2011-11-19 DIAGNOSIS — D72829 Elevated white blood cell count, unspecified: Secondary | ICD-10-CM | POA: Insufficient documentation

## 2011-11-19 HISTORY — DX: Unspecified osteoarthritis, unspecified site: M19.90

## 2011-11-19 LAB — DIFFERENTIAL
Monocytes Relative: 6 % (ref 3–12)
Neutro Abs: 9.9 10*3/uL — ABNORMAL HIGH (ref 1.7–7.7)

## 2011-11-19 LAB — COMPREHENSIVE METABOLIC PANEL
ALT: 15 U/L (ref 0–53)
Chloride: 96 mEq/L (ref 96–112)
Creatinine, Ser: 1.41 mg/dL — ABNORMAL HIGH (ref 0.50–1.35)
GFR calc Af Amer: 63 mL/min — ABNORMAL LOW (ref >90)
Glucose, Bld: 117 mg/dL — ABNORMAL HIGH (ref 70–99)
Potassium: 3.2 mEq/L — ABNORMAL LOW (ref 3.5–5.1)
Total Protein: 7.2 g/dL (ref 6.0–8.3)

## 2011-11-19 LAB — URINALYSIS, ROUTINE W REFLEX MICROSCOPIC
Bilirubin Urine: NEGATIVE
Glucose, UA: NEGATIVE mg/dL
Hgb urine dipstick: NEGATIVE
Ketones, ur: 40 mg/dL — AB
Protein, ur: NEGATIVE mg/dL
Urobilinogen, UA: 0.2 mg/dL (ref 0.0–1.0)

## 2011-11-19 LAB — CBC
HCT: 41.1 % (ref 39.0–52.0)
Hemoglobin: 14.8 g/dL (ref 13.0–17.0)
MCV: 86.5 fL (ref 78.0–100.0)
Platelets: 228 10*3/uL (ref 150–400)
RBC: 4.75 MIL/uL (ref 4.22–5.81)
RDW: 12.9 % (ref 11.5–15.5)
WBC: 11.7 10*3/uL — ABNORMAL HIGH (ref 4.0–10.5)

## 2011-11-19 MED ORDER — MORPHINE SULFATE 4 MG/ML IJ SOLN
4.0000 mg | Freq: Once | INTRAMUSCULAR | Status: AC
  Administered 2011-11-19: 4 mg via INTRAVENOUS
  Filled 2011-11-19: qty 1

## 2011-11-19 MED ORDER — SODIUM CHLORIDE 0.9 % IV SOLN
INTRAVENOUS | Status: DC
  Administered 2011-11-19: 22:00:00 via INTRAVENOUS

## 2011-11-19 MED ORDER — ONDANSETRON HCL 4 MG/2ML IJ SOLN
4.0000 mg | Freq: Once | INTRAMUSCULAR | Status: AC
  Administered 2011-11-19: 4 mg via INTRAVENOUS
  Filled 2011-11-19: qty 2

## 2011-11-19 NOTE — ED Notes (Signed)
Patient from [facility] for evaluation of possible appendicitis. Patient c/o RLQ abdominal pain. Started as cramping Monday and has gotten worse.

## 2011-11-19 NOTE — ED Notes (Signed)
MD at bedside. Dr. Caporossi at bedside.  

## 2011-11-19 NOTE — ED Notes (Signed)
Last PO intake 1900.

## 2011-11-19 NOTE — ED Notes (Signed)
Patient transported to [facility] 

## 2011-11-19 NOTE — ED Provider Notes (Signed)
History     CSN: [id number]  Arrival date & time 11/19/11  2043   First MD Initiated Contact with Patient 11/19/11 2123      Chief Complaint  Patient presents with  . Abdominal Pain    (Consider location/radiation/quality/duration/timing/severity/associated sxs/prior treatment) Patient is a 56 y.o. male presenting with abdominal pain. The history is provided by the patient and the spouse.  Abdominal Pain The primary symptoms of the illness include abdominal pain and nausea. The primary symptoms of the illness do not include fever, shortness of breath, vomiting, diarrhea or dysuria.  Symptoms associated with the illness do not include chills or hematuria.  The pt is a 3 y male who c/ progressive abd pain for 4 d.   He has had n but no vomiting. No diarrhea. No resp or uti sxs.  Pain more severe today. Went to [facility] walk in. Sent here for eval. No hx of abd surgery. No etoh.  No hx of pud.    Past Medical History  Diagnosis Date  . Arthritis     Past Surgical History  Procedure Date  . Patent foramen ovale closure   . Craniotomy   . Anterior cruciate ligament repair   . Deep vein   . Pulmonary embolus     No family history on file.  History  Substance Use Topics  . Smoking status: Never Smoker   . Smokeless tobacco: Not on file  . Alcohol Use: No      Review of Systems  Constitutional: Negative for fever and chills.  Respiratory: Negative for cough and shortness of breath.   Cardiovascular: Negative for chest pain.  Gastrointestinal: Positive for nausea and abdominal pain. Negative for vomiting and diarrhea.  Genitourinary: Negative for dysuria and hematuria.  Neurological: Negative for headaches.  Psychiatric/Behavioral: Negative for confusion.  All other systems reviewed and are negative.    Allergies  Dilantin; Heparin; and Iohexol  Home Medications   Current Outpatient Rx  Name Route Sig Dispense Refill  . ASPIRIN 81 MG PO TABS Oral Take 81 mg by  mouth daily.    Marland Kitchen FLUTICASONE-SALMETEROL 100-50 MCG/DOSE IN AEPB Inhalation Inhale 1 puff into the lungs every 12 (twelve) hours.    Marland Kitchen LEVETIRACETAM 500 MG PO TABS Oral Take 500 mg by mouth 2 (two) times daily.    Marland Kitchen PANTOPRAZOLE SODIUM 40 MG PO TBEC Oral Take 40 mg by mouth daily.      BP 139/82  Pulse 71  Temp(Src) 99 F (37.2 C) (Oral)  Resp 15  Ht 5\' 7"  (1.702 m)  Wt 177 lb (80.287 kg)  BMI 27.72 kg/m2  SpO2 97%  Physical Exam  Vitals reviewed. Constitutional: He is oriented to person, place, and time. He appears well-developed and well-nourished. No distress.  HENT:  Head: Normocephalic and atraumatic.  Eyes: Conjunctivae are normal.  Neck: Normal range of motion. Neck supple.  Cardiovascular: Normal rate and regular rhythm.   Pulmonary/Chest: Effort normal and breath sounds normal. No respiratory distress.  Abdominal: Soft. He exhibits no distension. There is tenderness. There is no rebound and no guarding.       ruq pain  No pain or guarding in rlq Left side soft and nontender  Musculoskeletal: Normal range of motion. He exhibits no edema.  Neurological: He is alert and oriented to person, place, and time.  Skin: Skin is warm and dry.  Psychiatric: He has a normal mood and affect. Thought content normal.    ED Course  Procedures (including  critical care time) ruq pain for 4 d.  Mild nausea. No other sxs. Mild temp in crease.  No dm.  No immunocompromise.   Will check labs and [place] for possible gall stones/infx.    Labs Reviewed  CBC - Abnormal; Notable for the following:    WBC 11.7 (*)    All other components within normal limits  DIFFERENTIAL - Abnormal; Notable for the following:    Neutrophils Relative 85 (*)    Neutro Abs 9.9 (*)    Lymphocytes Relative 9 (*)    All other components within normal limits  COMPREHENSIVE METABOLIC PANEL - Abnormal; Notable for the following:    Potassium 3.2 (*)    Glucose, Bld 117 (*)    Creatinine, Ser 1.41 (*)    GFR calc  non Af Amer 55 (*)    GFR calc Af Amer 63 (*)    All other components within normal limits  URINALYSIS, ROUTINE W REFLEX MICROSCOPIC - Abnormal; Notable for the following:    Ketones, ur 40 (*)    All other components within normal limits  LIPASE, BLOOD   No results found.   No diagnosis found.    MDM  ruq abd pain         Cheri Guppy, MD 11/22/11 [phone]

## 2011-11-20 ENCOUNTER — Encounter (HOSPITAL_COMMUNITY): Payer: Self-pay | Admitting: Anesthesiology

## 2011-11-20 ENCOUNTER — Encounter (HOSPITAL_COMMUNITY)
Admission: EM | Disposition: A | Discharge status: Home / Self Care | Payer: Self-pay | Source: Home / Self Care | Attending: Emergency Medicine

## 2011-11-20 ENCOUNTER — Emergency Department (HOSPITAL_COMMUNITY): Payer: 59

## 2011-11-20 ENCOUNTER — Encounter (HOSPITAL_COMMUNITY): Payer: Self-pay | Admitting: General Surgery

## 2011-11-20 ENCOUNTER — Observation Stay (HOSPITAL_COMMUNITY): Payer: 59 | Admitting: Anesthesiology

## 2011-11-20 ENCOUNTER — Encounter (HOSPITAL_COMMUNITY): Payer: Self-pay

## 2011-11-20 HISTORY — PX: LAPAROSCOPIC APPENDECTOMY: SHX408

## 2011-11-20 HISTORY — PX: LAPAROSCOPIC APPENDECTOMY: SUR753

## 2011-11-20 SURGERY — APPENDECTOMY, LAPAROSCOPIC
Anesthesia: General | Site: Abdomen | Wound class: Contaminated

## 2011-11-20 MED ORDER — LACTATED RINGERS IV SOLN: INTRAVENOUS | Status: DC

## 2011-11-20 MED ORDER — LACTATED RINGERS IV SOLN
INTRAVENOUS | Status: DC | PRN
  Administered 2011-11-20 (×2): via INTRAVENOUS

## 2011-11-20 MED ORDER — HYDROMORPHONE HCL PF 1 MG/ML IJ SOLN
INTRAMUSCULAR | Status: AC
  Filled 2011-11-20: qty 1

## 2011-11-20 MED ORDER — MIDAZOLAM HCL 5 MG/5ML IJ SOLN
INTRAMUSCULAR | Status: DC | PRN
  Administered 2011-11-20: 2 mg via INTRAVENOUS

## 2011-11-20 MED ORDER — DEXAMETHASONE SODIUM PHOSPHATE 4 MG/ML IJ SOLN
INTRAMUSCULAR | Status: DC | PRN
  Administered 2011-11-20: 10 mg via INTRAVENOUS

## 2011-11-20 MED ORDER — PIPERACILLIN-TAZOBACTAM 3.375 G IVPB
3.3750 g | Freq: Three times a day (TID) | INTRAVENOUS | Status: DC
  Administered 2011-11-20 – 2011-11-21 (×4): 3.375 g via INTRAVENOUS
  Filled 2011-11-20 (×6): qty 50

## 2011-11-20 MED ORDER — ONDANSETRON HCL 4 MG PO TABS
4.0000 mg | ORAL_TABLET | Freq: Four times a day (QID) | ORAL | Status: DC | PRN
  Filled 2011-11-20: qty 1

## 2011-11-20 MED ORDER — KCL IN DEXTROSE-NACL 20-5-0.9 MEQ/L-%-% IV SOLN
INTRAVENOUS | Status: DC
  Filled 2011-11-20 (×4): qty 1000

## 2011-11-20 MED ORDER — FLUTICASONE-SALMETEROL 100-50 MCG/DOSE IN AEPB
1.0000 | INHALATION_SPRAY | Freq: Two times a day (BID) | RESPIRATORY_TRACT | Status: DC
  Filled 2011-11-20: qty 14

## 2011-11-20 MED ORDER — PROPOFOL 10 MG/ML IV EMUL
INTRAVENOUS | Status: DC | PRN
  Administered 2011-11-20: 170 mg via INTRAVENOUS

## 2011-11-20 MED ORDER — MORPHINE SULFATE 10 MG/ML IJ SOLN: 4.0000 mg | INTRAMUSCULAR | Status: DC | PRN

## 2011-11-20 MED ORDER — 0.9 % SODIUM CHLORIDE (POUR BTL) OPTIME
TOPICAL | Status: DC | PRN
  Administered 2011-11-20: 1000 mL

## 2011-11-20 MED ORDER — BUPIVACAINE-EPINEPHRINE PF 0.25-1:200000 % IJ SOLN
INTRAMUSCULAR | Status: DC | PRN
  Administered 2011-11-20: 15 mL

## 2011-11-20 MED ORDER — LIDOCAINE HCL (CARDIAC) 20 MG/ML IV SOLN
INTRAVENOUS | Status: DC | PRN
  Administered 2011-11-20: 100 mg via INTRAVENOUS

## 2011-11-20 MED ORDER — ONDANSETRON HCL 4 MG/2ML IJ SOLN: 4.0000 mg | Freq: Four times a day (QID) | INTRAMUSCULAR | Status: DC | PRN

## 2011-11-20 MED ORDER — TRAMADOL HCL 50 MG PO TABS
50.0000 mg | ORAL_TABLET | ORAL | Status: DC | PRN
  Filled 2011-11-20: qty 1

## 2011-11-20 MED ORDER — MORPHINE SULFATE 4 MG/ML IJ SOLN
4.0000 mg | INTRAMUSCULAR | Status: DC | PRN
  Administered 2011-11-20 – 2011-11-21 (×6): 4 mg via INTRAVENOUS
  Filled 2011-11-20 (×6): qty 1

## 2011-11-20 MED ORDER — SUFENTANIL CITRATE 50 MCG/ML IV SOLN
INTRAVENOUS | Status: DC | PRN
  Administered 2011-11-20: 20 ug via INTRAVENOUS
  Administered 2011-11-20: 10 ug via INTRAVENOUS

## 2011-11-20 MED ORDER — MIDAZOLAM HCL 2 MG/2ML IJ SOLN
INTRAMUSCULAR | Status: AC
  Filled 2011-11-20: qty 2

## 2011-11-20 MED ORDER — MORPHINE SULFATE 4 MG/ML IJ SOLN
4.0000 mg | Freq: Once | INTRAMUSCULAR | Status: AC
  Administered 2011-11-20: 4 mg via INTRAVENOUS
  Filled 2011-11-20: qty 1

## 2011-11-20 MED ORDER — LACTATED RINGERS IR SOLN
Status: DC | PRN
  Administered 2011-11-20: 1000 mL

## 2011-11-20 MED ORDER — POTASSIUM CHLORIDE IN NACL 20-0.9 MEQ/L-% IV SOLN
INTRAVENOUS | Status: DC
  Administered 2011-11-20 – 2011-11-21 (×2): via INTRAVENOUS
  Filled 2011-11-20 (×5): qty 1000

## 2011-11-20 MED ORDER — PROMETHAZINE HCL 25 MG/ML IJ SOLN: 6.2500 mg | INTRAMUSCULAR | Status: DC | PRN

## 2011-11-20 MED ORDER — GLYCOPYRROLATE 0.2 MG/ML IJ SOLN
INTRAMUSCULAR | Status: DC | PRN
  Administered 2011-11-20: .6 mg via INTRAVENOUS

## 2011-11-20 MED ORDER — NEOSTIGMINE METHYLSULFATE 1 MG/ML IJ SOLN
INTRAMUSCULAR | Status: DC | PRN
  Administered 2011-11-20: 4 mg via INTRAVENOUS

## 2011-11-20 MED ORDER — LEVETIRACETAM 500 MG PO TABS
500.0000 mg | ORAL_TABLET | Freq: Two times a day (BID) | ORAL | Status: DC
  Administered 2011-11-20 – 2011-11-21 (×3): 500 mg via ORAL
  Filled 2011-11-20 (×5): qty 1

## 2011-11-20 MED ORDER — PANTOPRAZOLE SODIUM 40 MG IV SOLR: 40.0000 mg | Freq: Every day | INTRAVENOUS | Status: DC

## 2011-11-20 MED ORDER — BUPIVACAINE-EPINEPHRINE PF 0.25-1:200000 % IJ SOLN
INTRAMUSCULAR | Status: AC
  Filled 2011-11-20: qty 30

## 2011-11-20 MED ORDER — SODIUM CHLORIDE 0.9 % IV SOLN
1.0000 g | Freq: Once | INTRAVENOUS | Status: AC
  Administered 2011-11-20: 1 g via INTRAVENOUS
  Filled 2011-11-20: qty 1

## 2011-11-20 MED ORDER — HYDROMORPHONE HCL PF 1 MG/ML IJ SOLN
0.2500 mg | INTRAMUSCULAR | Status: DC | PRN
  Administered 2011-11-20 (×2): 0.25 mg via INTRAVENOUS

## 2011-11-20 MED ORDER — PANTOPRAZOLE SODIUM 40 MG PO TBEC
40.0000 mg | DELAYED_RELEASE_TABLET | Freq: Every day | ORAL | Status: DC
  Administered 2011-11-20 – 2011-11-21 (×2): 40 mg via ORAL
  Filled 2011-11-20 (×2): qty 1

## 2011-11-20 MED ORDER — SODIUM CHLORIDE 0.9 % IV SOLN: 1.0000 g | INTRAVENOUS | Status: DC

## 2011-11-20 MED ORDER — TRAMADOL HCL 50 MG PO TABS: 50.0000 mg | ORAL_TABLET | ORAL | Status: AC | PRN

## 2011-11-20 MED ORDER — CISATRACURIUM BESYLATE (PF) 10 MG/5ML IV SOLN
INTRAVENOUS | Status: DC | PRN
  Administered 2011-11-20: 5 mg via INTRAVENOUS

## 2011-11-20 MED ORDER — ONDANSETRON HCL 4 MG/2ML IJ SOLN
INTRAMUSCULAR | Status: DC | PRN
  Administered 2011-11-20: 4 mg via INTRAVENOUS

## 2011-11-20 MED ORDER — SUCCINYLCHOLINE CHLORIDE 20 MG/ML IJ SOLN
INTRAMUSCULAR | Status: DC | PRN
  Administered 2011-11-20: 100 mg via INTRAVENOUS

## 2011-11-20 MED ORDER — ACETAMINOPHEN 325 MG PO TABS: 650.0000 mg | ORAL_TABLET | Freq: Four times a day (QID) | ORAL | Status: DC | PRN

## 2011-11-20 MED ORDER — NAPROXEN 500 MG PO TABS
500.0000 mg | ORAL_TABLET | Freq: Three times a day (TID) | ORAL | Status: DC | PRN
  Filled 2011-11-20: qty 1

## 2011-11-20 SURGICAL SUPPLY — 31 items
APPLIER CLIP 5 13 M/L LIGAMAX5 (MISCELLANEOUS)
BANDAGE ADHESIVE 1X3 (GAUZE/BANDAGES/DRESSINGS) IMPLANT
BENZOIN TINCTURE PRP APPL 2/3 (GAUZE/BANDAGES/DRESSINGS) IMPLANT
CANISTER SUCTION 2500CC (MISCELLANEOUS) ×2 IMPLANT
CHLORAPREP W/TINT 26ML (MISCELLANEOUS) ×2 IMPLANT
CLIP APPLIE 5 13 M/L LIGAMAX5 (MISCELLANEOUS) IMPLANT
CLOTH BEACON ORANGE TIMEOUT ST (SAFETY) ×2 IMPLANT
COVER SURGICAL LIGHT HANDLE (MISCELLANEOUS) ×2 IMPLANT
CUTTER FLEX LINEAR 45M (STAPLE) ×2 IMPLANT
DECANTER SPIKE VIAL GLASS SM (MISCELLANEOUS) IMPLANT
DRAPE LAPAROSCOPIC ABDOMINAL (DRAPES) ×2 IMPLANT
ELECT REM PT RETURN 9FT ADLT (ELECTROSURGICAL) ×2
ELECTRODE REM PT RTRN 9FT ADLT (ELECTROSURGICAL) ×1 IMPLANT
GLOVE SURG SIGNA 7.5 PF LTX (GLOVE) ×4 IMPLANT
GOWN STRL NON-REIN LRG LVL3 (GOWN DISPOSABLE) ×2 IMPLANT
GOWN STRL REIN XL XLG (GOWN DISPOSABLE) ×4 IMPLANT
KIT BASIN OR (CUSTOM PROCEDURE TRAY) ×2 IMPLANT
POUCH SPECIMEN RETRIEVAL 10MM (ENDOMECHANICALS) ×2 IMPLANT
RELOAD 45 VASCULAR/THIN (ENDOMECHANICALS) IMPLANT
RELOAD STAPLE TA45 3.5 REG BLU (ENDOMECHANICALS) ×2 IMPLANT
SCALPEL HARMONIC ACE (MISCELLANEOUS) ×2 IMPLANT
SET IRRIG TUBING LAPAROSCOPIC (IRRIGATION / IRRIGATOR) ×2 IMPLANT
SOLUTION ANTI FOG 6CC (MISCELLANEOUS) ×2 IMPLANT
STRIP CLOSURE SKIN 1/2X4 (GAUZE/BANDAGES/DRESSINGS) IMPLANT
SUT MNCRL AB 4-0 PS2 18 (SUTURE) ×2 IMPLANT
TOWEL OR 17X26 10 PK STRL BLUE (TOWEL DISPOSABLE) ×2 IMPLANT
TRAY FOLEY CATH 14FRSI W/METER (CATHETERS) ×2 IMPLANT
TRAY LAP CHOLE (CUSTOM PROCEDURE TRAY) ×2 IMPLANT
TROCAR BLADELESS OPT 5 75 (ENDOMECHANICALS) ×4 IMPLANT
TROCAR XCEL BLUNT TIP 100MML (ENDOMECHANICALS) ×2 IMPLANT
TUBING INSUFFLATION 10FT LAP (TUBING) ×2 IMPLANT

## 2011-11-20 NOTE — Op Note (Signed)
APPENDECTOMY LAPAROSCOPIC  Procedure Note  Tamaj Jurgens Strain 11/19/2011 - 11/20/2011   Pre-op Diagnosis: appendicitis     Post-op Diagnosis: same  Procedure(s): APPENDECTOMY LAPAROSCOPIC  Surgeon(s): Shelly Rubenstein, MD  Anesthesia: General  Staff:  Vevelyn Pat, RN - Circulator Therese Sarah, RN - Scrub Person Michaela A Hubbard, CST - Scrub Person  Estimated Blood Loss: Minimal               Specimens: appendix         Findings: The patient was found to have a retrocecal, nonperforated, acutely inflamed appendix  Procedure: The patient was brought to the operating room and identified as the correct patient. He was placed supine on the operating table and general anesthesia was induced. His abdomen was then prepped and draped in the usual sterile fashion. Using a #15 blade a small transverse incision was made at the lower edge of the umbilicus. Incision was carried down to the fascia which was then opened with a scalpel. A hemostat was then used to pass into the peritoneal cavity under direct vision. A 0 Vicryl inverting suture was placed around the fascial opening. The [facility] port was placed in the opening and insufflation of the abdomen was begun. A 5 mm port was in place the patient's right upper quadrant and another in the lower midline under direct vision. The cecum was identified. Identified the base of the appendix. The appendix was quite enlarged and inflamed and coursed retrocecal. I dissected out the base and then transected it with the laparoscopic GIA stapler. I then took down the mesoappendix with the harmonic scalpel. Once the appendix was completely removed it was placed in an Endosac and removed through the incision at the umbilicus. I then thoroughly evaluated the right lower quadrant. The stump appeared intact. There appeared no evidence of injury. I irrigated the abdomen with several liters of normal saline. Hemostasis appeared to be achieved. All ports were  removed under direct vision and the abdomen was deflated. A 0 Vicryl the light was then tied in place.  I placed another 0 Vicryl figure-of-eight suture at the umbilicus to help close a small umbilical hernia defect. All incisions were then anesthetized with Marcaine and closed with 4-0 Monocryl subcuticular sutures. Steri-Strips and Band-Aids were then applied. The patient tolerated the procedure well. All the counts were correct at the end of the procedure. The patient was then extubated in the operating room and taken in a stable condition to the recovery room. Gill Delrossi A   Date: 11/20/2011  Time: 8:31 AM

## 2011-11-20 NOTE — Anesthesia Procedure Notes (Signed)
Procedure Name: Intubation Date/Time: 11/20/2011 7:44 AM Performed by: Leroy Libman L Patient Re-evaluated:Patient Re-evaluated prior to inductionOxygen Delivery Method: Circle system utilized Preoxygenation: Pre-oxygenation with 100% oxygen Intubation Type: IV induction, Cricoid Pressure applied and Rapid sequence Laryngoscope Size: Miller and 3 Grade View: Grade I Tube type: Oral Tube size: 8.0 mm Number of attempts: 1 Airway Equipment and Method: Stylet Placement Confirmation: ETT inserted through vocal cords under direct vision,  breath sounds checked- equal and bilateral and positive ETCO2 Secured at: 22 cm Tube secured with: Tape Dental Injury: Teeth and Oropharynx as per pre-operative assessment

## 2011-11-20 NOTE — ED Provider Notes (Signed)
  Physical Exam  BP 105/72  Pulse 74  Temp(Src) 98.8 F (37.1 C) (Oral)  Resp 16  Ht 5\' 7"  (1.702 m)  Wt 177 lb (80.287 kg)  BMI 27.72 kg/m2  SpO2 100%  Physical Exam  ED Course  Procedures  MDM Received patient in signout to check ultrasound. Ultrasound did not show gallstones. He did however show some free fluid in front of the liver. Decision was done to do a CT. CT showed an appendicitis. Patient was admitted to medicine. He is tender in the right upper to right flank. Dr Carolynne Edouard will admit the patient.      Juliet Rude. Rubin Payor, MD 11/20/11 [id number]

## 2011-11-20 NOTE — Transfer of Care (Signed)
Immediate Anesthesia Transfer of Care Note  Patient: [name]  Procedure(s) Performed: Procedure(s) (LRB): APPENDECTOMY LAPAROSCOPIC (N/A)  Patient Location: PACU  Anesthesia Type: General  Level of Consciousness: awake, alert  and oriented  Airway & Oxygen Therapy: Patient Spontanous Breathing and Patient connected to face mask oxygen  Post-op Assessment: Report given to PACU RN and Post -op Vital signs reviewed and stable  Post vital signs: Reviewed and stable  Complications: No apparent anesthesia complications

## 2011-11-20 NOTE — Progress Notes (Signed)
UR complete 

## 2011-11-20 NOTE — Preoperative (Signed)
Beta Blockers   Reason not to administer Beta Blockers:Not Applicable 

## 2011-11-20 NOTE — ED Notes (Signed)
Pt wants to talk to Dr. Magnus Ivan before signing consent. Consent form at bedside.

## 2011-11-20 NOTE — H&P (Signed)
[name] is an 56 y.o. male.   Chief Complaint: abd pain HPI: 56 yo wm presents with right sided abd pain that started on Monday. Pain has increased during the week. His doc initially thought he had gallbladder pain because his pain was kind of high but last night he was sent to ED and CT showed he had an enlarged inflamed appendix. No evidence of perforation but is appears retrocecal. He has had fever and chills  Past Medical History  Diagnosis Date  . Arthritis     Past Surgical History  Procedure Date  . Patent foramen ovale closure   . Craniotomy   . Anterior cruciate ligament repair   . Deep vein   . Pulmonary embolus     No family history on file. Social History:  reports that he has never smoked. He does not have any smokeless tobacco history on file. He reports that he does not drink alcohol or use illicit drugs.  Allergies:  Allergies  Allergen Reactions  . Dilantin (Phenytoin) Hives  . Heparin Hives  . Iohexol      Code: HIVES, Desc: hives and tachycardia last time pt received IV CM kdean 07/19/06, Onset Date: [id number]      (Not in a hospital admission)  Results for orders placed during the hospital encounter of 11/19/11 (from the past 48 hour(s))  CBC     Status: Abnormal   Collection Time   11/19/11  9:28 PM      Component Value Range Comment   WBC 11.7 (*) 4.0 - 10.5 (K/uL)    RBC 4.75  4.22 - 5.81 (MIL/uL)    Hemoglobin 14.8  13.0 - 17.0 (g/dL)    HCT 65.7  84.6 - 96.2 (%)    MCV 86.5  78.0 - 100.0 (fL)    MCH 31.2  26.0 - 34.0 (pg)    MCHC 36.0  30.0 - 36.0 (g/dL)    RDW 95.2  84.1 - 32.4 (%)    Platelets 228  150 - 400 (K/uL)   DIFFERENTIAL     Status: Abnormal   Collection Time   11/19/11  9:28 PM      Component Value Range Comment   Neutrophils Relative 85 (*) 43 - 77 (%)    Neutro Abs 9.9 (*) 1.7 - 7.7 (K/uL)    Lymphocytes Relative 9 (*) 12 - 46 (%)    Lymphs Abs 1.0  0.7 - 4.0 (K/uL)    Monocytes Relative 6  3 - 12 (%)    Monocytes  Absolute 0.7  0.1 - 1.0 (K/uL)    Eosinophils Relative 0  0 - 5 (%)    Eosinophils Absolute 0.0  0.0 - 0.7 (K/uL)    Basophils Relative 0  0 - 1 (%)    Basophils Absolute 0.0  0.0 - 0.1 (K/uL)   COMPREHENSIVE METABOLIC PANEL     Status: Abnormal   Collection Time   11/19/11  9:28 PM      Component Value Range Comment   Sodium 135  135 - 145 (mEq/L)    Potassium 3.2 (*) 3.5 - 5.1 (mEq/L)    Chloride 96  96 - 112 (mEq/L)    CO2 27  19 - 32 (mEq/L)    Glucose, Bld 117 (*) 70 - 99 (mg/dL)    BUN 16  6 - 23 (mg/dL)    Creatinine, Ser 4.01 (*) 0.50 - 1.35 (mg/dL)    Calcium 9.4  8.4 - 10.5 (mg/dL)  Total Protein 7.2  6.0 - 8.3 (g/dL)    Albumin 4.1  3.5 - 5.2 (g/dL)    AST 20  0 - 37 (U/L)    ALT 15  0 - 53 (U/L)    Alkaline Phosphatase 58  39 - 117 (U/L)    Total Bilirubin 0.7  0.3 - 1.2 (mg/dL)    GFR calc non Af Amer 55 (*) >90 (mL/min)    GFR calc Af Amer 63 (*) >90 (mL/min)   LIPASE, BLOOD     Status: Normal   Collection Time   11/19/11  9:28 PM      Component Value Range Comment   Lipase 42  11 - 59 (U/L)   URINALYSIS, ROUTINE W REFLEX MICROSCOPIC     Status: Abnormal   Collection Time   11/19/11 10:01 PM      Component Value Range Comment   Color, Urine YELLOW  YELLOW     APPearance CLEAR  CLEAR     Specific Gravity, Urine 1.018  1.005 - 1.030     pH 6.0  5.0 - 8.0     Glucose, UA NEGATIVE  NEGATIVE (mg/dL)    Hgb urine dipstick NEGATIVE  NEGATIVE     Bilirubin Urine NEGATIVE  NEGATIVE     Ketones, ur 40 (*) NEGATIVE (mg/dL)    Protein, ur NEGATIVE  NEGATIVE (mg/dL)    Urobilinogen, UA 0.2  0.0 - 1.0 (mg/dL)    Nitrite NEGATIVE  NEGATIVE     Leukocytes, UA NEGATIVE  NEGATIVE  MICROSCOPIC NOT DONE ON URINES WITH NEGATIVE PROTEIN, BLOOD, LEUKOCYTES, NITRITE, OR GLUCOSE <1000 mg/dL.   Ct Abdomen Pelvis Wo Contrast  11/20/2011  *RADIOLOGY REPORT*  Clinical Data: Right lower quadrant abdominal pain and cramping; nausea.  Leukocytosis.  CT ABDOMEN AND PELVIS WITHOUT CONTRAST   Technique:  Multidetector CT imaging of the abdomen and pelvis was performed following the standard protocol without intravenous contrast.  Comparison: Abdominal ultrasound performed 03/21/2012, and CT of the abdomen and pelvis performed 12/24/2005  Findings: The visualized lung bases are clear.  Postoperative change is noted along the right atrium.  The liver and spleen are unremarkable in appearance.  The gallbladder is within normal limits.  The pancreas and adrenal glands are unremarkable.  Scattered bilateral renal cysts are noted, measuring up to 3.0 cm in size.  Nonspecific perinephric stranding is noted bilaterally. The kidneys are otherwise unremarkable in appearance.  There is no evidence of hydronephrosis.  No renal or ureteral stones are seen.  The small bowel is unremarkable in appearance.  The stomach is within normal limits.  No acute vascular abnormalities are seen.  The appendix is markedly dilated, measuring up to 2.3 cm in diameter.  Surrounding soft tissue inflammation and a small amount of free fluid are seen tracking along the right paracolic gutter and along Gerota's fascia.  Findings are compatible with acute appendicitis.  The appendix is retrocecal in nature.  There is no evidence for perforation or abscess formation at this time.  A small amount of associated free fluid is seen within the pelvis. The colon is unremarkable in appearance.  The bladder is mildly distended and grossly unremarkable in appearance.  The prostate remains normal in size.  No inguinal lymphadenopathy is seen.  No acute osseous abnormalities are identified.  A chronic right- sided pars defect is noted at L5.  Intervertebral disc space narrowing is noted at L5-S1, with associated vacuum phenomenon. There is mild grade 1 anterolisthesis of L5 on  S1, reflecting facet disease and the pars defect.  IMPRESSION:  1.  Acute appendicitis noted, with marked dilatation of the appendix to 2.3 cm in diameter, surrounding soft  tissue inflammation, and free fluid tracking along the right paracolic gutter, Gerota's fascia and the pelvis.  No evidence of perforation or abscess formation at this time.  The appendix is retrocecal in nature. 2.  Scattered bilateral renal cysts seen. 3.  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  These results were called by telephone on 11/20/2011  at  04:38 a.m. to  Dr. Benjiman Core, who verbally acknowledged these results.  Original Report Authenticated By: Tonia Ghent, M.D.   US Abdomen Complete  11/20/2011  *RADIOLOGY REPORT*  Clinical Data: Right flank pain and vomiting.  ABDOMEN ULTRASOUND  Technique:  Complete abdominal ultrasound examination was performed including evaluation of the liver, gallbladder, bile ducts, pancreas, kidneys, spleen, IVC, and abdominal aorta.  Comparison: CT scan from 12/24/2005  Findings:  Gallbladder:  There is no evidence for gallstones.  No gallbladder wall thickening or pericholecystic fluid.  The sonographer reports no sonographic Murphy's sign.  Common Bile Duct:  Nondilated at 3 mm diameter.  Liver:  13 mm echogenic focus is identified in the central right liver.  Ultrasound imaging features suggest hemangioma.  Review of the old CT scan shows a 10 mm low density lesion in the central liver which may represent the same finding.  IVC:  Normal.  Pancreas:  Obscured by overlying bowel gas.  Spleen:  Unremarkable  Right kidney:  12.3  cm in long axis.  2.9 cm simple appearing cyst is seen in the upper pole, unchanged in the interval.  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  Left kidney:  10.8 cm in long axis.  15 mm cystic lesion is seen in the upper pole.  Upper pole cyst was also seen on the previous CT.  Abdominal Aorta:  Obscured by overlying bowel gas.  Note:  Trace amount of free fluid is seen inferior to the anterior liver.  IMPRESSION: Trace intraperitoneal free fluid of indeterminate etiology.  13  mm and echogenic focus in the central liver likely represents a hemangioma and probably corresponds to the small low density lesion seen on the previous CT scan.  Bilateral renal cyst.  Original Report Authenticated By: ERIC A. MANSELL, M.D.    Review of Systems  Constitutional: Positive for fever and chills.  HENT: Negative.   Eyes: Negative.   Respiratory: Negative.   Cardiovascular: Negative.   Gastrointestinal: Positive for abdominal pain.  Genitourinary: Negative.   Musculoskeletal: Negative.   Skin: Negative.   Neurological: Negative.   Endo/Heme/Allergies: Negative.   Psychiatric/Behavioral: Negative.     Blood pressure 105/72, pulse 74, temperature 98.8 F (37.1 C), temperature source Oral, resp. rate 16, height 5\' 7"  (1.702 m), weight 177 lb (80.287 kg), SpO2 100.00%. Physical Exam  Constitutional: He is oriented to person, place, and time. He appears well-developed and well-nourished.  HENT:  Head: Normocephalic and atraumatic.  Eyes: Conjunctivae and EOM are normal. Pupils are equal, round, and reactive to light.  Neck: Normal range of motion. Neck supple.  Cardiovascular: Normal rate, regular rhythm and normal heart sounds.   Respiratory: Effort normal and breath sounds normal.  GI: Soft. Bowel sounds are normal.       Tender over right flank/mid abd laterally  Musculoskeletal: Normal range of motion.  Neurological: He is alert and oriented to person, place, and  time.  Skin: Skin is warm and dry.  Psychiatric: He has a normal mood and affect. His behavior is normal.     Assessment/Plan Appendicitis. Because of the risk of perforation and sepsis he would benefit from having appendix removed. I have discussed with him the risks and benefits of surgery including some of the technical aspects including the risk of leak and he understands and wishes to proceed.  TOTH III,Eathen Budreau S 11/20/2011, 5:10 AM

## 2011-11-20 NOTE — ED Notes (Signed)
MD at bedside. Dr. Carolynne Edouard, surgeon at bedside.

## 2011-11-20 NOTE — Discharge Instructions (Signed)
Laparoscopic Appendectomy Appendectomy is surgery to remove the appendix. Laparoscopic surgery uses several small cuts (incisions) instead of one large incision. Laparoscopic surgery offers a shorter recovery time and less discomfort. LET YOUR CAREGIVER KNOW ABOUT:  Allergies to food or medicine.   Medicines taken, including vitamins, dietary supplements, herbs, eyedrops, over-the-counter medicines, and creams.   Use of steroids (by mouth or creams).   Previous problems with anesthetics or numbing medicines.   History of bleeding problems or blood clots.   Previous surgery.   Other health problems, including diabetes, heart problems, lung problems, and kidney problems.   Possibility of pregnancy, if this applies.  RISKS AND COMPLICATIONS  Infection. A germ starts growing in the wound. This can usually be treated with antibiotics. In some cases, the wound will need to be opened and cleaned.   Bleeding.   Damage to other organs.   Sores (abscesses).   Chronic pain at the incision sites. This is defined as pain that lasts for more than 3 months.   Blood clots in the legs that may rarely travel to the lungs.   Infection in the lungs (pneumonia).  BEFORE THE PROCEDURE Appendectomy is usually performed immediately after an inflamed appendix (appendicitis) is diagnosed. No preparation is necessary ahead of this procedure. PROCEDURE  You will be given medicine that makes you sleep (general anesthetic). After you are asleep, a flexible tube (catheter) may be inserted into your bladder to drain your urine during surgery. The tube is removed before you wake up after surgery. When you are asleep, carbondioxide gas will be used to inflate your abdomen. This will allow your surgeon to see inside your abdomen and perform your surgery. Three small incisions will be made in your abdomen. Your surgeon will insert a thin, lighted tube (laparoscope) through one of the incisions. Your surgeon will  look through the laparoscope while performing the surgery. Other tools will be inserted through the other incisions. Laparoscopic procedures may not be appropriate when:  There is major scarring from a previous surgery.   The patient has bleeding disorders.   A pregnancy is near term.   There are other conditions which make the laparoscopic procedure impossible, such as an advanced infection or a ruptured appendix.  If your surgeon feels it is not safe to continue with the laparoscopic procedure, he or she will perform an open surgery instead. This gives the surgeon a larger view and more space to work. Open surgery requires a longer recovery time. After your appendix is removed, your incisions will be closed with stitches (sutures) or skin adhesive. AFTER THE PROCEDURE You will be taken to a recovery room. When the anesthesia has worn off, you will be returned to your hospital room. You will be given pain medicines to keep you comfortable. Ask your caregiver how long your hospital stay will be. Document Released: 02/11/2004 Document Revised: 06/18/2011 Document Reviewed: 01/06/2011 Exit[employer] Patient Information 2012 [employer].CCS ______[facility] LAPAROSCOPIC SURGERY: POST OP INSTRUCTIONS Always review your discharge instruction sheet given to you by the facility where your surgery was performed. IF YOU HAVE DISABILITY OR FAMILY LEAVE FORMS, YOU MUST BRING THEM TO THE OFFICE FOR PROCESSING.   DO NOT GIVE THEM TO YOUR DOCTOR.  1. A prescription for pain medication may be given to you upon discharge.  Take your pain medication as prescribed, if needed.  If narcotic pain medicine is not needed, then you may take acetaminophen (Tylenol) or ibuprofen (Advil) as needed. 2. Take your   usually prescribed medications unless otherwise directed. 3. If you need a refill on your pain medication, please contact your pharmacy.  They will contact our office to request authorization.  Prescriptions will not be filled after 5pm or on week-ends. 4. You should follow a light diet the first few days after arrival home, such as soup and crackers, etc.  Be sure to include lots of fluids daily. 5. Most patients will experience some swelling and bruising in the area of the incisions.  Ice packs will help.  Swelling and bruising can take several days to resolve.  6. It is common to experience some constipation if taking pain medication after surgery.  Increasing fluid intake and taking a stool softener (such as Colace) will usually help or prevent this problem from occurring.  A mild laxative (Milk of Magnesia or Miralax) should be taken according to package instructions if there are no bowel movements after 48 hours. 7. Unless discharge instructions indicate otherwise, you may remove your bandages 24-48 hours after surgery, and you may shower at that time.  You may have steri-strips (small skin tapes) in place directly over the incision.  These strips should be left on the skin for 7-10 days.  If your surgeon used skin glue on the incision, you may shower in 24 hours.  The glue will flake off over the next 2-3 weeks.  Any sutures or staples will be removed at the office during your follow-up visit. 8. ACTIVITIES:  You may resume regular (light) daily activities beginning the next day--such as daily self-care, walking, climbing stairs--gradually increasing activities as tolerated.  You may have sexual intercourse when it is comfortable.  Refrain from any heavy lifting or straining until approved by your doctor. a. You may drive when you are no longer taking prescription pain medication, you can comfortably wear a seatbelt, and you can safely maneuver your car and apply brakes. b. RETURN TO WORK:  __________________________________________________________ 9. You should see your doctor in the office for a follow-up appointment approximately 2-3 weeks after your surgery.  Make sure that you call for  this appointment within a day or two after you arrive home to insure a convenient appointment time. 10. OTHER INSTRUCTIONS: __________________________________________________________________________________________________________________________ __________________________________________________________________________________________________________________________ WHEN TO CALL YOUR DOCTOR: 1. Fever over 101.0 2. Inability to urinate 3. Continued bleeding from incision. 4. Increased pain, redness, or drainage from the incision. 5. Increasing abdominal pain  The clinic staff is available to answer your questions during regular business hours.  Please don't hesitate to call and ask to speak to one of the nurses for clinical concerns.  If you have a medical emergency, go to the nearest emergency room or call 911.  A surgeon from [facility] is always on call at the hospital. [address], [address], [place], [place]  [address] ? [address] [address], , [place]   [address] [phone] ? [phone] ? FAX [phone] Web site: [email or web address]  

## 2011-11-20 NOTE — Anesthesia Preprocedure Evaluation (Addendum)
Anesthesia Evaluation  Patient identified by MRN, date of birth, ID band Patient awake    Reviewed: Allergy & Precautions, H&P , NPO status , Patient's Chart, lab work & pertinent test results  Airway Mallampati: II TM Distance: >3 FB Neck ROM: Full    Dental No notable dental hx.    Pulmonary neg pulmonary ROS,  breath sounds clear to auscultation  Pulmonary exam normal       Cardiovascular Exercise Tolerance: Good hypertension, Pt. on medications Rhythm:Regular Rate:Normal  S/P heart surgery for PFO two weeks after brain surgery. ECHO and ECG one month ago were good by report and Dr. Jacinto Halim released him for 3 years.   Neuro/Psych S/P brain surgery for abscess 6 years ago. 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 negative psych ROS   GI/Hepatic negative GI ROS, Neg liver ROS,   Endo/Other  negative endocrine ROS  Renal/GU negative Renal ROS  negative genitourinary   Musculoskeletal negative musculoskeletal ROS (+)   Abdominal   Peds negative pediatric ROS (+)  Hematology negative hematology ROS (+)   Anesthesia Other Findings   Reproductive/Obstetrics negative OB ROS                          Anesthesia Physical Anesthesia Plan  ASA: II  Anesthesia Plan: General   Post-op Pain Management:    Induction: Intravenous  Airway Management Planned: Oral ETT  Additional Equipment:   Intra-op Plan:   Post-operative Plan: Extubation in OR  Informed Consent: I have reviewed the patients History and Physical, chart, labs and discussed the procedure including the risks, benefits and alternatives for the proposed anesthesia with the patient or authorized representative who has indicated his/her understanding and acceptance.   Dental advisory given  Plan Discussed with: CRNA  Anesthesia Plan Comments:         Anesthesia Quick  Evaluation

## 2011-11-20 NOTE — Progress Notes (Signed)
Glasses and  2 bags of clothes taken to 1539 upon transfer.

## 2011-11-20 NOTE — Anesthesia Postprocedure Evaluation (Signed)
  Anesthesia Post-op Note  Patient: [name]  Procedure(s) Performed: Procedure(s) (LRB): APPENDECTOMY LAPAROSCOPIC (N/A)  Patient Location: PACU  Anesthesia Type: General  Level of Consciousness: awake and alert   Airway and Oxygen Therapy: Patient Spontanous Breathing  Post-op Pain: mild  Post-op Assessment: Post-op Vital signs reviewed, Patient's Cardiovascular Status Stable, Respiratory Function Stable, Patent Airway and No signs of Nausea or vomiting  Post-op Vital Signs: stable  Complications: No apparent anesthesia complications

## 2011-11-20 NOTE — ED Notes (Signed)
Patient returned from CT. Pt states his pain is worsening.

## 2011-11-20 NOTE — Interval H&P Note (Signed)
History and Physical Interval Note:  Agree with above  11/20/2011 7:20 AM  [name]  has presented today for surgery, with the diagnosis of appendicitis  The various methods of treatment have been discussed with the patient and family. After consideration of risks, benefits and other options for treatment, the patient has consented to  Procedure(s) (LRB): APPENDECTOMY LAPAROSCOPIC (N/A) as a surgical intervention .  The patients' history has been reviewed, patient examined, no change in status, stable for surgery.  I have reviewed the patients' chart and labs.  Questions were answered to the patient's satisfaction.   Risks discussed in detail including bleeding, infection, injury to surrounding structures, stump leak, need to convert to an open procedure, etc.  Webber Michiels A

## 2011-11-20 NOTE — ED Notes (Signed)
OR tech here to take pt to OR 

## 2011-11-20 NOTE — ED Notes (Signed)
Patient transported to CT 

## 2011-11-21 MED ORDER — HYDROCODONE-ACETAMINOPHEN 5-325 MG PO TABS: 1.0000 | ORAL_TABLET | ORAL | Status: AC | PRN

## 2011-11-21 NOTE — Progress Notes (Signed)
Patient ID: [name], male   DOB: 09/17/1955, 56 y.o.   MRN: [id number] 1 Day Post-Op  Subjective: No complaints this morning. Has been tolerating solid food without nausea.  Objective: Vital signs in last 24 hours: Temp:  [97.2 F (36.2 C)-98.4 F (36.9 C)] 98.4 F (36.9 C) (05/11 0543) Pulse Rate:  [56-75] 57  (05/11 0543) Resp:  [10-18] 18  (05/11 0543) BP: (90-145)/(60-88) 117/81 mmHg (05/11 0543) SpO2:  [93 %-100 %] 94 % (05/11 0543) Last BM Date: 11/21/11  Intake/Output from previous day: 05/10 0701 - 05/11 0700 In: 3860 [P.O.:100; I.V.:3610; IV Piggyback:150] Out: 2175 [Urine:2125; Blood:50] Intake/Output this shift:    General appearance: alert and no distress GI: normal findings: soft, non-tender Incision/Wound: clean and dry  Lab Results:   Basename 11/19/11 2128  WBC 11.7*  HGB 14.8  HCT 41.1  PLT 228   BMET  Basename 11/19/11 2128  NA 135  K 3.2*  CL 96  CO2 27  GLUCOSE 117*  BUN 16  CREATININE 1.41*  CALCIUM 9.4     Studies/Results: Ct Abdomen Pelvis Wo Contrast  11/20/2011  *RADIOLOGY REPORT*  Clinical Data: Right lower quadrant abdominal pain and cramping; nausea.  Leukocytosis.  CT ABDOMEN AND PELVIS WITHOUT CONTRAST  Technique:  Multidetector CT imaging of the abdomen and pelvis was performed following the standard protocol without intravenous contrast.  Comparison: Abdominal ultrasound performed 03/21/2012, and CT of the abdomen and pelvis performed 12/24/2005  Findings: The visualized lung bases are clear.  Postoperative change is noted along the right atrium.  The liver and spleen are unremarkable in appearance.  The gallbladder is within normal limits.  The pancreas and adrenal glands are unremarkable.  Scattered bilateral renal cysts are noted, measuring up to 3.0 cm in size.  Nonspecific perinephric stranding is noted bilaterally. The kidneys are otherwise unremarkable in appearance.  There is no evidence of hydronephrosis.  No renal  or ureteral stones are seen.  The small bowel is unremarkable in appearance.  The stomach is within normal limits.  No acute vascular abnormalities are seen.  The appendix is markedly dilated, measuring up to 2.3 cm in diameter.  Surrounding soft tissue inflammation and a small amount of free fluid are seen tracking along the right paracolic gutter and along Gerota's fascia.  Findings are compatible with acute appendicitis.  The appendix is retrocecal in nature.  There is no evidence for perforation or abscess formation at this time.  A small amount of associated free fluid is seen within the pelvis. The colon is unremarkable in appearance.  The bladder is mildly distended and grossly unremarkable in appearance.  The prostate remains normal in size.  No inguinal lymphadenopathy is seen.  No acute osseous abnormalities are identified.  A chronic right- sided pars defect is noted at L5.  Intervertebral disc space narrowing is noted at L5-S1, with associated vacuum phenomenon. There is mild grade 1 anterolisthesis of L5 on S1, reflecting facet disease and the pars defect.  IMPRESSION:  1.  Acute appendicitis noted, with marked dilatation of the appendix to 2.3 cm in diameter, surrounding soft tissue inflammation, and free fluid tracking along the right paracolic gutter, Gerota's fascia and the pelvis.  No evidence of perforation or abscess formation at this time.  The appendix is retrocecal in nature. 2.  Scattered bilateral renal cysts seen. 3.  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  These results were called by telephone on 11/20/2011  at  04:38 a.m. to  Dr. Benjiman Core, who verbally acknowledged these results.  Original Report Authenticated By: Tonia Ghent, M.D.   US Abdomen Complete  11/20/2011  *RADIOLOGY REPORT*  Clinical Data: Right flank pain and vomiting.  ABDOMEN ULTRASOUND  Technique:  Complete abdominal ultrasound examination was performed including  evaluation of the liver, gallbladder, bile ducts, pancreas, kidneys, spleen, IVC, and abdominal aorta.  Comparison: CT scan from 12/24/2005  Findings:  Gallbladder:  There is no evidence for gallstones.  No gallbladder wall thickening or pericholecystic fluid.  The sonographer reports no sonographic Murphy's sign.  Common Bile Duct:  Nondilated at 3 mm diameter.  Liver:  13 mm echogenic focus is identified in the central right liver.  Ultrasound imaging features suggest hemangioma.  Review of the old CT scan shows a 10 mm low density lesion in the central liver which may represent the same finding.  IVC:  Normal.  Pancreas:  Obscured by overlying bowel gas.  Spleen:  Unremarkable  Right kidney:  12.3  cm in long axis.  2.9 cm simple appearing cyst is seen in the upper pole, unchanged in the interval.  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  Left kidney:  10.8 cm in long axis.  15 mm cystic lesion is seen in the upper pole.  Upper pole cyst was also seen on the previous CT.  Abdominal Aorta:  Obscured by overlying bowel gas.  Note:  Trace amount of free fluid is seen inferior to the anterior liver.  IMPRESSION: Trace intraperitoneal free fluid of indeterminate etiology.  13 mm and echogenic focus in the central liver likely represents a hemangioma and probably corresponds to the small low density lesion seen on the previous CT scan.  Bilateral renal cyst.  Original Report Authenticated By: ERIC A. MANSELL, M.D.    Anti-infectives: Anti-infectives     Start     Dose/Rate Route Frequency Ordered Stop   11/21/11 0600   ertapenem (INVANZ) 1 g in sodium chloride 0.9 % 50 mL IVPB  Status:  Discontinued        1 g 100 mL/hr over 30 Minutes Intravenous Every 24 hours 11/20/11 0559 11/20/11 1036   11/20/11 1400  piperacillin-tazobactam (ZOSYN) IVPB 3.375 g       3.375 g 12.5 mL/hr over 240 Minutes Intravenous 3 times per day 11/20/11 1040     11/20/11 0500   ertapenem (INVANZ) 1 g in sodium  chloride 0.9 % 50 mL IVPB        1 g 100 mL/hr over 30 Minutes Intravenous  Once 11/20/11 0447 11/20/11 0547          Assessment/Plan: s/p Procedure(s): APPENDECTOMY LAPAROSCOPIC Doing well. Plan discharge today.   LOS: 2 days    Flara Storti T 11/21/2011

## 2011-11-21 NOTE — Progress Notes (Signed)
Pt discharged via wheelchair to home with wife with out incident.  Pt instructions and prescription in patient possession. Pt instructed to call Dr Magnus Ivan with any issues or concerns

## 2011-11-21 NOTE — Progress Notes (Signed)
Pt states right outer thigh numb to touch without any change in leg strength. Reported to Dr Johna Sheriff. He states may have been from strap during surgery and should resolve on its own, but may take se3veral weeks. Told pt if it does not resolve to mention it to md on f/u visit.

## 2011-11-23 ENCOUNTER — Encounter (HOSPITAL_COMMUNITY): Payer: Self-pay | Admitting: Surgery

## 2011-12-04 NOTE — Discharge Summary (Signed)
Physician Discharge Summary  Patient ID: HENCE DERRICK MRN: [id number] DOB/AGE: [birth year] 56 y.o.  Admit date: 11/19/2011 Discharge date: 12/04/2011  Admission Diagnoses: appendicitis   Discharge Diagnoses: same Active Problems:  * No active hospital problems. *    PROCEDURES: APPENDECTOMY LAPAROSCOPIC  11/20/2011 Meyer Russel Course:  56 yo wm presents with right sided abd pain that started on Monday. Pain has increased during the week. His doc initially thought he had gallbladder pain because his pain was kind of high but last night he was sent to ED and CT showed he had an enlarged inflamed appendix. No evidence of perforation but is appears retrocecal. He has had fever and chills.   He was taken to the OR, 5/10, did well and discharged the next day, by Dr. Johna Sheriff Condition on d/c:  Improved    Disposition: 01-Home or Self Care  Discharge Orders    Future Appointments: Provider: Department: Dept Phone: Center:   12/08/2011 9:40 AM Shelly Rubenstein, MD [facility] [phone] None     Future Orders Please Complete By Expires   Discharge patient      Comments:   At sundown today     Medication List  As of 12/04/2011  1:16 PM   TAKE these medications         acetaminophen 325 MG tablet   Commonly known as: TYLENOL   Take 2 tablets (650 mg total) by mouth every 6 (six) hours as needed for pain.      aspirin 81 MG tablet   Take 81 mg by mouth daily.      Fluticasone-Salmeterol 100-50 MCG/DOSE Aepb   Commonly known as: ADVAIR   Inhale 1 puff into the lungs every 12 (twelve) hours.      irbesartan 150 MG tablet   Commonly known as: AVAPRO   Take 150 mg by mouth at bedtime.      ketoconazole 2 % shampoo   Commonly known as: NIZORAL   Apply 1 application topically 2 (two) times a week.      levETIRAcetam 500 MG tablet   Commonly known as: KEPPRA   Take 500 mg by mouth 2 (two) times daily.      pantoprazole 40 MG tablet   Commonly  known as: PROTONIX   Take 40 mg by mouth daily.           Follow-up Information    Follow up with [facility] Novamed A, MD. Schedule an appointment as soon as possible for a visit in 2 weeks.   Contact information:   [employer] [address] [place] [address] [phone]       Follow up with No lifting over 20 pounds for 4 weeks..         SignedSherrie George 12/04/2011, 1:16 PM

## 2011-12-08 ENCOUNTER — Ambulatory Visit (INDEPENDENT_AMBULATORY_CARE_PROVIDER_SITE_OTHER): Payer: 59 | Admitting: Surgery

## 2011-12-08 ENCOUNTER — Encounter (INDEPENDENT_AMBULATORY_CARE_PROVIDER_SITE_OTHER): Payer: Self-pay | Admitting: Surgery

## 2011-12-08 VITALS — BP 123/84 | HR 74 | Temp 98.6°F | Resp 16 | Ht 67.0 in | Wt 175.8 lb

## 2011-12-08 DIAGNOSIS — Z9049 Acquired absence of other specified parts of digestive tract: Secondary | ICD-10-CM | POA: Insufficient documentation

## 2011-12-08 DIAGNOSIS — Z09 Encounter for follow-up examination after completed treatment for conditions other than malignant neoplasm: Secondary | ICD-10-CM

## 2011-12-08 DIAGNOSIS — Z9889 Other specified postprocedural states: Secondary | ICD-10-CM

## 2011-12-08 NOTE — Progress Notes (Signed)
Subjective:     Patient ID: [name], male   DOB: [birth year], 56 y.o.   MRN: [id number]  HPI He is here for his first postop visit status post laparoscopic appendectomy. He is doing well and has no complaints. He denies fevers.  Review of Systems     Objective:   Physical Exam His incisions are healing well. There is no evidence of infection. The final pathology showed acute appendicitis with no other abnormalities    Assessment:     Patient did well status post lap appendectomy    Plan:     He may return normal activity. I will see him back as needed.

## 2012-01-25 ENCOUNTER — Other Ambulatory Visit (HOSPITAL_COMMUNITY): Payer: Self-pay | Admitting: Neurosurgery

## 2012-01-25 DIAGNOSIS — D329 Benign neoplasm of meninges, unspecified: Secondary | ICD-10-CM

## 2012-01-29 ENCOUNTER — Ambulatory Visit (HOSPITAL_COMMUNITY)
Admission: RE | Admit: 2012-01-29 | Discharge: 2012-01-29 | Disposition: A | Discharge status: Home / Self Care | Payer: 59 | Source: Ambulatory Visit | Attending: Neurosurgery | Admitting: Neurosurgery

## 2012-01-29 DIAGNOSIS — G936 Cerebral edema: Secondary | ICD-10-CM | POA: Insufficient documentation

## 2012-01-29 DIAGNOSIS — R209 Unspecified disturbances of skin sensation: Secondary | ICD-10-CM | POA: Insufficient documentation

## 2012-01-29 DIAGNOSIS — D329 Benign neoplasm of meninges, unspecified: Secondary | ICD-10-CM

## 2012-01-29 DIAGNOSIS — G9389 Other specified disorders of brain: Secondary | ICD-10-CM | POA: Insufficient documentation

## 2012-01-29 DIAGNOSIS — G939 Disorder of brain, unspecified: Secondary | ICD-10-CM | POA: Insufficient documentation

## 2012-01-29 MED ORDER — GADOBENATE DIMEGLUMINE 529 MG/ML IV SOLN
15.0000 mL | Freq: Once | INTRAVENOUS | Status: AC | PRN
  Administered 2012-01-29: 15 mL via INTRAVENOUS

## 2012-02-01 LAB — POCT I-STAT, CHEM 8
BUN: 28 mg/dL — ABNORMAL HIGH (ref 6–23)
Calcium, Ion: 1.22 mmol/L (ref 1.12–1.23)
Hemoglobin: 14.6 g/dL (ref 13.0–17.0)
TCO2: 26 mmol/L (ref 0–100)

## 2012-03-01 ENCOUNTER — Emergency Department (HOSPITAL_COMMUNITY): Payer: 59

## 2012-03-01 ENCOUNTER — Emergency Department (HOSPITAL_COMMUNITY)
Admission: EM | Admit: 2012-03-01 | Discharge: 2012-03-01 | Disposition: A | Discharge status: Home / Self Care | Payer: 59 | Attending: Emergency Medicine | Admitting: Emergency Medicine

## 2012-03-01 ENCOUNTER — Encounter (HOSPITAL_COMMUNITY): Payer: Self-pay | Admitting: Adult Health

## 2012-03-01 DIAGNOSIS — Z79899 Other long term (current) drug therapy: Secondary | ICD-10-CM | POA: Insufficient documentation

## 2012-03-01 DIAGNOSIS — Z86718 Personal history of other venous thrombosis and embolism: Secondary | ICD-10-CM | POA: Insufficient documentation

## 2012-03-01 DIAGNOSIS — R209 Unspecified disturbances of skin sensation: Secondary | ICD-10-CM | POA: Insufficient documentation

## 2012-03-01 DIAGNOSIS — J45909 Unspecified asthma, uncomplicated: Secondary | ICD-10-CM | POA: Insufficient documentation

## 2012-03-01 DIAGNOSIS — I10 Essential (primary) hypertension: Secondary | ICD-10-CM | POA: Insufficient documentation

## 2012-03-01 DIAGNOSIS — R569 Unspecified convulsions: Secondary | ICD-10-CM | POA: Insufficient documentation

## 2012-03-01 DIAGNOSIS — Z86711 Personal history of pulmonary embolism: Secondary | ICD-10-CM | POA: Insufficient documentation

## 2012-03-01 LAB — CBC WITH DIFFERENTIAL/PLATELET
Basophils Absolute: 0 10*3/uL (ref 0.0–0.1)
Basophils Absolute: 0 10*3/uL (ref 0.0–0.1)
Basophils Relative: 0 % (ref 0–1)
Basophils Relative: 0 % (ref 0–1)
Eosinophils Absolute: 0.1 10*3/uL (ref 0.0–0.7)
Eosinophils Relative: 1 % (ref 0–5)
Lymphocytes Relative: 29 % (ref 12–46)
MCH: 29.9 pg (ref 26.0–34.0)
MCHC: 35.3 g/dL (ref 30.0–36.0)
MCV: 86.7 fL (ref 78.0–100.0)
Monocytes Absolute: 0.5 10*3/uL (ref 0.1–1.0)
Neutro Abs: 3.9 10*3/uL (ref 1.7–7.7)
Neutrophils Relative %: 55 % (ref 43–77)
Platelets: 188 10*3/uL (ref 150–400)
RDW: 13.1 % (ref 11.5–15.5)
RDW: 12.9 % (ref 11.5–15.5)
WBC: 6.9 10*3/uL (ref 4.0–10.5)

## 2012-03-01 LAB — BASIC METABOLIC PANEL
Chloride: 102 mEq/L (ref 96–112)
Creatinine, Ser: 1.46 mg/dL — ABNORMAL HIGH (ref 0.50–1.35)
GFR calc Af Amer: 61 mL/min — ABNORMAL LOW (ref >90)

## 2012-03-01 LAB — COMPREHENSIVE METABOLIC PANEL
ALT: 16 U/L (ref 0–53)
AST: 20 U/L (ref 0–37)
Albumin: 3.9 g/dL (ref 3.5–5.2)
Calcium: 9.9 mg/dL (ref 8.4–10.5)
Sodium: 142 mEq/L (ref 135–145)
Total Protein: 6.7 g/dL (ref 6.0–8.3)

## 2012-03-01 LAB — URINALYSIS, ROUTINE W REFLEX MICROSCOPIC
Bilirubin Urine: NEGATIVE
Hgb urine dipstick: NEGATIVE
Specific Gravity, Urine: 1.009 (ref 1.005–1.030)
pH: 6 (ref 5.0–8.0)

## 2012-03-01 LAB — GLUCOSE, CAPILLARY: Glucose-Capillary: 74 mg/dL (ref 70–99)

## 2012-03-01 MED ORDER — LEVETIRACETAM 500 MG PO TABS
500.0000 mg | ORAL_TABLET | Freq: Once | ORAL | Status: DC
  Filled 2012-03-01: qty 1

## 2012-03-01 MED ORDER — LEVETIRACETAM 500 MG PO TABS: 500.0000 mg | ORAL_TABLET | Freq: Three times a day (TID) | ORAL | Status: DC

## 2012-03-01 MED ORDER — POTASSIUM CHLORIDE CRYS ER 20 MEQ PO TBCR
40.0000 meq | EXTENDED_RELEASE_TABLET | Freq: Once | ORAL | Status: AC
  Administered 2012-03-01: 40 meq via ORAL
  Filled 2012-03-01: qty 2

## 2012-03-01 NOTE — ED Notes (Signed)
Pt resting comfortably

## 2012-03-01 NOTE — ED Notes (Signed)
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 Today began having left sided focal seizure where pt remained alert and had left sided twitching, was able to take a dose keppra during seizure. Pt is alert and oriented, answers all questions, no post-ictal period. Small area of abscess has grown per dr. Eilleen Kempf, neurology.

## 2012-03-01 NOTE — ED Provider Notes (Addendum)
History     CSN: [id number]  Arrival date & time 03/01/12  1846   First MD Initiated Contact with Patient 03/01/12 1911      Chief Complaint  Patient presents with  . Seizures    (Consider location/radiation/quality/duration/timing/severity/associated sxs/prior treatment) HPI Comments: [name] is a 56 y.o. 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 He had the abscess drained a year ago and was placed on Keppra. In the last month, his L arm becomes more numb. He had an MRI brain a month a ago that showed that the mass is increasing in size. Today, he had twitching of his head and lips and L arm that lasted 2 minutes. He took his keppra and called EMS. He is back to baseline and has been taking his keppra. Good PO intake, no vomiting, no fevers.     The history is provided by the patient.    Past Medical History  Diagnosis Date  . Arthritis   . Asthma   . DVT (deep venous thrombosis)   . Hx of pulmonary embolus   . Hypertension     Past Surgical History  Procedure Date  . Patent foramen ovale closure   . Craniotomy   . Anterior cruciate ligament repair   . Deep vein   . Pulmonary embolus   . Brain surgery   . Laparoscopic appendectomy 11/20/2011  . Laparoscopic appendectomy 11/20/2011    Procedure: APPENDECTOMY LAPAROSCOPIC;  Surgeon: Shelly Rubenstein, MD;  Location: [facility];  Service: General;  Laterality: N/A;    History reviewed. No pertinent family history.  History  Substance Use Topics  . Smoking status: Never Smoker   . Smokeless tobacco: Never Used  . Alcohol Use: No      Review of Systems  Neurological: Positive for seizures and numbness.  All other systems reviewed and are negative.    Allergies  Dilantin; Heparin; and Iohexol  Home Medications   Current Outpatient Rx  Name Route Sig Dispense Refill  . ASPIRIN 81 MG PO TABS Oral Take 81 mg by mouth daily.    . IRBESARTAN 150 MG PO TABS Oral Take 150 mg by mouth at  bedtime.    Marland Kitchen KETOCONAZOLE 2 % EX SHAM Topical Apply 1 application topically 2 (two) times a week.    Marland Kitchen LEVETIRACETAM 500 MG PO TABS Oral Take 500 mg by mouth 2 (two) times daily.    Marland Kitchen NAPROXEN SODIUM 220 MG PO TABS Oral Take 220 mg by mouth daily as needed. For pain    . PANTOPRAZOLE SODIUM 40 MG PO TBEC Oral Take 40 mg by mouth daily.      BP 161/107  Pulse 70  Temp 98.2 F (36.8 C) (Oral)  Resp 15  SpO2 98%  Physical Exam  Nursing note and vitals reviewed. Constitutional: He appears well-developed and well-nourished.       Calm, NAD. Not postictal.   HENT:  Head: Normocephalic and atraumatic.  Mouth/Throat: Oropharynx is clear and moist.  Eyes: Conjunctivae are normal. Pupils are equal, round, and reactive to light.  Neck: Normal range of motion. Neck supple.  Cardiovascular: Normal rate, regular rhythm and normal heart sounds.   Pulmonary/Chest: Effort normal and breath sounds normal.  Abdominal: Soft. Bowel sounds are normal.  Musculoskeletal: Normal range of motion.  Neurological:       A&O x 3. Decreased sensation on L arm (per patient, chronic). Nl strength and sensation.   Skin: Skin is warm and  dry.  Psychiatric: He has a normal mood and affect. His behavior is normal. Judgment and thought content normal.    ED Course  Procedures (including critical care time)  Labs Reviewed  BASIC METABOLIC PANEL - Abnormal; Notable for the following:    Potassium 3.1 (*)     Creatinine, Ser 1.46 (*)     GFR calc non Af Amer 52 (*)     GFR calc Af Amer 61 (*)     All other components within normal limits  COMPREHENSIVE METABOLIC PANEL - Abnormal; Notable for the following:    Creatinine, Ser 1.43 (*)     GFR calc non Af Amer 54 (*)     GFR calc Af Amer 62 (*)     All other components within normal limits  CBC WITH DIFFERENTIAL  GLUCOSE, CAPILLARY  CBC WITH DIFFERENTIAL  URINALYSIS, ROUTINE W REFLEX MICROSCOPIC   Ct Head Wo Contrast  03/01/2012  *RADIOLOGY REPORT*   Clinical Data: 56 year old male with history of brain abscess. Seizures.  CT HEAD WITHOUT CONTRAST  Technique:  Contiguous axial images were obtained from the base of the skull through the vertex without contrast.  Comparison: Brain MRI 01/29/2012 and earlier.  Findings: Sequelae of right frontal craniotomy. No acute osseous abnormality identified.  Visualized paranasal sinuses and mastoids are clear.  No acute orbit or scalp soft tissue findings.  Hypodensity in the right superior frontal gyrus in keeping with the vasogenic edema pattern seen on the comparison.  The extent of the abnormality appears stable.  The chronic rounded somewhat cortically based mass in this area is hyperdense on CT.  As before, no significant mass effect.  No ventriculomegaly. No evidence of cortically based acute infarction identified.  No other intracranial mass lesions and no other areas of cerebral edema are identified.  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  No new intracranial abnormality identified.   Original Report Authenticated By: [name], M.D.      No diagnosis found.    MDM  [name] is a 56 y.o. male hx of R brain abscess here with seizure. I discussed with neurosurgeon on call for Dr. Bari Lewis, Dr. Jordan Lewis, who will come and examine the patient. He recommend labs and CT head noncontrast.   11:02 PM CBC, CMP at baseline. K 3.1, give Kdur. UA nl. CT head showed stable R superior frontal gyrus lesion with vasogenic edema. Discussed with Dr. Jordan Lewis, who recommend that patient take keppra 500mg  TID and f/u with his neurosurgeon. Patient understand instructions. Patient given an extra dose tonight.       Leroy Canal, MD 03/01/12 9147  Leroy Canal, MD 03/01/12 [phone]

## 2012-03-03 ENCOUNTER — Other Ambulatory Visit: Payer: Self-pay | Admitting: Neurosurgery

## 2012-03-03 ENCOUNTER — Other Ambulatory Visit (HOSPITAL_COMMUNITY): Payer: Self-pay | Admitting: Neurosurgery

## 2012-03-03 DIAGNOSIS — D496 Neoplasm of unspecified behavior of brain: Secondary | ICD-10-CM

## 2012-03-21 ENCOUNTER — Ambulatory Visit (HOSPITAL_COMMUNITY)
Admission: RE | Admit: 2012-03-21 | Discharge: 2012-03-21 | Disposition: A | Discharge status: Home / Self Care | Payer: 59 | Source: Ambulatory Visit | Attending: Neurosurgery | Admitting: Neurosurgery

## 2012-03-21 ENCOUNTER — Other Ambulatory Visit (HOSPITAL_COMMUNITY): Payer: Self-pay | Admitting: Neurosurgery

## 2012-03-21 DIAGNOSIS — G060 Intracranial abscess and granuloma: Secondary | ICD-10-CM | POA: Insufficient documentation

## 2012-03-21 DIAGNOSIS — D496 Neoplasm of unspecified behavior of brain: Secondary | ICD-10-CM

## 2012-03-21 MED ORDER — IOHEXOL 300 MG/ML  SOLN
100.0000 mL | Freq: Once | INTRAMUSCULAR | Status: AC | PRN
  Administered 2012-03-21: 100 mL via INTRAVENOUS

## 2012-03-22 ENCOUNTER — Encounter (HOSPITAL_COMMUNITY): Payer: Self-pay

## 2012-03-22 ENCOUNTER — Other Ambulatory Visit (HOSPITAL_COMMUNITY): Payer: 59

## 2012-03-22 ENCOUNTER — Encounter (HOSPITAL_COMMUNITY)
Admission: RE | Admit: 2012-03-22 | Discharge: 2012-03-22 | Disposition: A | Discharge status: Home / Self Care | Payer: 59 | Source: Ambulatory Visit | Attending: Neurosurgery | Admitting: Neurosurgery

## 2012-03-22 HISTORY — DX: Personal history of other diseases of the musculoskeletal system and connective tissue: Z87.39

## 2012-03-22 HISTORY — DX: Personal history of other medical treatment: Z92.89

## 2012-03-22 HISTORY — DX: Polyneuropathy, unspecified: G62.9

## 2012-03-22 HISTORY — DX: Atrial septal defect: Q21.1

## 2012-03-22 HISTORY — DX: Chronic kidney disease, stage 3 unspecified: N18.30

## 2012-03-22 HISTORY — DX: Intracranial abscess and granuloma: G06.0

## 2012-03-22 HISTORY — DX: Patent foramen ovale: Q21.12

## 2012-03-22 HISTORY — DX: Unspecified convulsions: R56.9

## 2012-03-22 HISTORY — DX: Sleep apnea, unspecified: G47.30

## 2012-03-22 HISTORY — DX: Chronic kidney disease, stage 3 (moderate): N18.3

## 2012-03-22 LAB — CBC
MCV: 87.6 fL (ref 78.0–100.0)
Platelets: 259 10*3/uL (ref 150–400)
RBC: 4.85 MIL/uL (ref 4.22–5.81)
WBC: 20.3 10*3/uL — ABNORMAL HIGH (ref 4.0–10.5)

## 2012-03-22 LAB — BASIC METABOLIC PANEL
CO2: 29 mEq/L (ref 19–32)
Calcium: 9.9 mg/dL (ref 8.4–10.5)
GFR calc Af Amer: 61 mL/min — ABNORMAL LOW (ref >90)
Sodium: 141 mEq/L (ref 135–145)

## 2012-03-22 LAB — TYPE AND SCREEN
ABO/RH(D): B POS
Antibody Screen: NEGATIVE

## 2012-03-22 LAB — SURGICAL PCR SCREEN: Staphylococcus aureus: NEGATIVE

## 2012-03-22 NOTE — Pre-Procedure Instructions (Signed)
20 KIMSEY DEMAREE  03/22/2012   Your procedure is scheduled on:  September 18  Report to [facility] at 06:30 AM.  Call this number if you have problems the morning of surgery: [phone]   Remember:   Do not eat or drink:After Midnight.  Take these medicines the morning of surgery with A SIP OF WATER: Keppra, Protonix     STOP Naproxen, Aspirin after today  Do not wear jewelry, make-up or nail polish.  Do not wear lotions, powders, or perfumes. You may wear deodorant.  Do not shave 48 hours prior to surgery. Men may shave face and neck.  Do not bring valuables to the hospital.  Contacts, dentures or bridgework may not be worn into surgery.  Leave suitcase in the car. After surgery it may be brought to your room.  For patients admitted to the hospital, checkout time is 11:00 AM the day of discharge.   Special Instructions: CHG Shower Use Special Wash: 1/2 bottle night before surgery and 1/2 bottle morning of surgery.   Please read over the following fact sheets that you were given: Pain Booklet, Coughing and Deep Breathing, Blood Transfusion Information and Surgical Site Infection Prevention

## 2012-03-22 NOTE — Progress Notes (Signed)
Requested EKG, Echo, last OV, and stress (if ava.) from Dr. Jacinto Halim. Request last OV and EKG from [facility]

## 2012-03-23 NOTE — Consult Note (Signed)
Anesthesia Chart Review:  Patient is a 56 year old male scheduled for craniotomy for brain tumor resection on 03/30/2012 by Dr. Franky Macho.  Other history includes nonsmoker, hypertension, CKD stage III, asthma, obstructive sleep apnea, seizures, PFO closure in July 2007 following craniotomy in June of 2007 for paradoxical septic emboli following tooth extraction with prolonged hospitalization with post-operative DVT and pulmonary embolism.  He is s/p laparoscopic appendectomy on 11/19/11.  PCP is Dr. Selena Batten.  His Cardiologist is Dr. 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  No further cardiac evaluation was felt indicated at that time and follow-up in "3 years on a when necessary basis" was recommended.  EKG on 10/28/11 showed NSR, low voltage complexes.  (By notes, he had a normal stress EKG on 10/14/10 with hypertensive BP response at [facility].)  Echo on 10/19/11 showed normal LV cavity size, normal diastolic filling, normal global wall motion, normal systolic global function, calculated EF 73%, mitral valve structurally normal with trace mitral regurgitation, left atrial size was normal. The intra-atrial septum was intact with appearance of a septal occluder which appear to be stable in position without obvious thrombus or residual defect. Findings were not felt significantly changed since 10/09/2010.    CXR on 03/22/12 showed evidence of atrial septal umbrella closure device, but no active disease.  Labs noted. Cr 1.46 (stable).  WBC 20.3.  I called results to Darl Pikes at Dr. Sueanne Margarita office.  Patient was on Decadron taper a few weeks ago.  Will repeat on the day of surgery.  Shonna Chock, PA-C

## 2012-03-29 MED ORDER — CEFAZOLIN SODIUM-DEXTROSE 2-3 GM-% IV SOLR
2.0000 g | INTRAVENOUS | Status: AC
  Administered 2012-03-30: 2 g via INTRAVENOUS
  Filled 2012-03-29 (×2): qty 50

## 2012-03-30 ENCOUNTER — Inpatient Hospital Stay (HOSPITAL_COMMUNITY)
Admission: RE | Admit: 2012-03-30 | Discharge: 2012-04-05 | DRG: 023 | Disposition: A | Discharge status: Home / Self Care | Payer: 59 | Source: Ambulatory Visit | Attending: Neurosurgery | Admitting: Neurosurgery

## 2012-03-30 ENCOUNTER — Ambulatory Visit (HOSPITAL_COMMUNITY): Payer: 59 | Admitting: Vascular Surgery

## 2012-03-30 ENCOUNTER — Encounter (HOSPITAL_COMMUNITY): Payer: Self-pay | Admitting: Vascular Surgery

## 2012-03-30 ENCOUNTER — Encounter (HOSPITAL_COMMUNITY): Payer: Self-pay | Admitting: *Deleted

## 2012-03-30 ENCOUNTER — Encounter (HOSPITAL_COMMUNITY): Payer: Self-pay

## 2012-03-30 ENCOUNTER — Encounter (HOSPITAL_COMMUNITY)
Admission: RE | Disposition: A | Discharge status: Home / Self Care | Payer: Self-pay | Source: Ambulatory Visit | Attending: Neurosurgery

## 2012-03-30 DIAGNOSIS — Y838 Other surgical procedures as the cause of abnormal reaction of the patient, or of later complication, without mention of misadventure at the time of the procedure: Secondary | ICD-10-CM | POA: Diagnosis not present

## 2012-03-30 DIAGNOSIS — I129 Hypertensive chronic kidney disease with stage 1 through stage 4 chronic kidney disease, or unspecified chronic kidney disease: Secondary | ICD-10-CM | POA: Diagnosis present

## 2012-03-30 DIAGNOSIS — G060 Intracranial abscess and granuloma: Principal | ICD-10-CM | POA: Diagnosis present

## 2012-03-30 DIAGNOSIS — G936 Cerebral edema: Secondary | ICD-10-CM | POA: Diagnosis present

## 2012-03-30 DIAGNOSIS — Z86711 Personal history of pulmonary embolism: Secondary | ICD-10-CM

## 2012-03-30 DIAGNOSIS — R279 Unspecified lack of coordination: Secondary | ICD-10-CM | POA: Diagnosis not present

## 2012-03-30 DIAGNOSIS — Z7982 Long term (current) use of aspirin: Secondary | ICD-10-CM

## 2012-03-30 DIAGNOSIS — Z91041 Radiographic dye allergy status: Secondary | ICD-10-CM

## 2012-03-30 DIAGNOSIS — G4730 Sleep apnea, unspecified: Secondary | ICD-10-CM | POA: Diagnosis present

## 2012-03-30 DIAGNOSIS — Y9210 Unspecified residential institution as the place of occurrence of the external cause: Secondary | ICD-10-CM | POA: Diagnosis not present

## 2012-03-30 DIAGNOSIS — Z888 Allergy status to other drugs, medicaments and biological substances status: Secondary | ICD-10-CM

## 2012-03-30 DIAGNOSIS — R29898 Other symptoms and signs involving the musculoskeletal system: Secondary | ICD-10-CM | POA: Diagnosis not present

## 2012-03-30 DIAGNOSIS — N183 Chronic kidney disease, stage 3 unspecified: Secondary | ICD-10-CM | POA: Diagnosis present

## 2012-03-30 DIAGNOSIS — G9389 Other specified disorders of brain: Secondary | ICD-10-CM | POA: Diagnosis present

## 2012-03-30 DIAGNOSIS — Z86718 Personal history of other venous thrombosis and embolism: Secondary | ICD-10-CM

## 2012-03-30 HISTORY — PX: CRANIOTOMY: SHX93

## 2012-03-30 LAB — DIFFERENTIAL
Basophils Absolute: 0 10*3/uL (ref 0.0–0.1)
Basophils Relative: 0 % (ref 0–1)
Lymphocytes Relative: 30 % (ref 12–46)
Neutro Abs: 4.5 10*3/uL (ref 1.7–7.7)
Neutrophils Relative %: 60 % (ref 43–77)

## 2012-03-30 LAB — CBC
Platelets: 215 10*3/uL (ref 150–400)
RDW: 13 % (ref 11.5–15.5)
WBC: 7.5 10*3/uL (ref 4.0–10.5)

## 2012-03-30 SURGERY — CRANIOTOMY TUMOR EXCISION
Anesthesia: General | Site: Head | Laterality: Right | Wound class: Clean

## 2012-03-30 MED ORDER — HYDROCODONE-ACETAMINOPHEN 5-325 MG PO TABS
1.0000 | ORAL_TABLET | ORAL | Status: DC | PRN
  Administered 2012-03-30 – 2012-04-05 (×18): 1 via ORAL
  Filled 2012-03-30 (×19): qty 1

## 2012-03-30 MED ORDER — ALBUTEROL SULFATE HFA 108 (90 BASE) MCG/ACT IN AERS
2.0000 | INHALATION_SPRAY | Freq: Four times a day (QID) | RESPIRATORY_TRACT | Status: DC | PRN
  Filled 2012-03-30: qty 6.7

## 2012-03-30 MED ORDER — DEXAMETHASONE SODIUM PHOSPHATE 4 MG/ML IJ SOLN
4.0000 mg | Freq: Four times a day (QID) | INTRAMUSCULAR | Status: AC
  Administered 2012-03-31 – 2012-04-01 (×3): 4 mg via INTRAVENOUS
  Filled 2012-03-30 (×4): qty 1

## 2012-03-30 MED ORDER — PANTOPRAZOLE SODIUM 40 MG IV SOLR
40.0000 mg | Freq: Every day | INTRAVENOUS | Status: DC
  Administered 2012-03-30: 40 mg via INTRAVENOUS
  Filled 2012-03-30 (×2): qty 40

## 2012-03-30 MED ORDER — ROCURONIUM BROMIDE 100 MG/10ML IV SOLN
INTRAVENOUS | Status: DC | PRN
  Administered 2012-03-30: 10 mg via INTRAVENOUS
  Administered 2012-03-30: 20 mg via INTRAVENOUS
  Administered 2012-03-30 (×3): 10 mg via INTRAVENOUS
  Administered 2012-03-30: 50 mg via INTRAVENOUS

## 2012-03-30 MED ORDER — MORPHINE SULFATE 2 MG/ML IJ SOLN
1.0000 mg | INTRAMUSCULAR | Status: DC | PRN
  Administered 2012-03-30 (×2): 2 mg via INTRAVENOUS
  Administered 2012-03-31: 1 mg via INTRAVENOUS
  Filled 2012-03-30 (×3): qty 1

## 2012-03-30 MED ORDER — THROMBIN 20000 UNITS EX KIT
PACK | CUTANEOUS | Status: DC | PRN
  Administered 2012-03-30: 20000 [IU] via TOPICAL

## 2012-03-30 MED ORDER — MAGNESIUM CITRATE PO SOLN
1.0000 | Freq: Once | ORAL | Status: AC | PRN
  Filled 2012-03-30: qty 296

## 2012-03-30 MED ORDER — NEOSTIGMINE METHYLSULFATE 1 MG/ML IJ SOLN
INTRAMUSCULAR | Status: DC | PRN
  Administered 2012-03-30: 4 mg via INTRAVENOUS

## 2012-03-30 MED ORDER — ONDANSETRON HCL 4 MG/2ML IJ SOLN
INTRAMUSCULAR | Status: DC | PRN
  Administered 2012-03-30: 4 mg via INTRAVENOUS

## 2012-03-30 MED ORDER — PROPOFOL 10 MG/ML IV BOLUS
INTRAVENOUS | Status: DC | PRN
  Administered 2012-03-30: 200 mg via INTRAVENOUS
  Administered 2012-03-30: 50 mg via INTRAVENOUS

## 2012-03-30 MED ORDER — PHENYLEPHRINE HCL 10 MG/ML IJ SOLN
INTRAMUSCULAR | Status: DC | PRN
  Administered 2012-03-30 (×3): 40 ug via INTRAVENOUS

## 2012-03-30 MED ORDER — ASPIRIN 81 MG PO TABS: 81.0000 mg | ORAL_TABLET | Freq: Every day | ORAL | Status: DC

## 2012-03-30 MED ORDER — MANNITOL 25 % IV SOLN
INTRAVENOUS | Status: DC | PRN
  Administered 2012-03-30: 37.5 g via INTRAVENOUS

## 2012-03-30 MED ORDER — PANTOPRAZOLE SODIUM 40 MG PO TBEC: 40.0000 mg | DELAYED_RELEASE_TABLET | Freq: Every day | ORAL | Status: DC

## 2012-03-30 MED ORDER — DEXAMETHASONE SODIUM PHOSPHATE 4 MG/ML IJ SOLN
4.0000 mg | Freq: Three times a day (TID) | INTRAMUSCULAR | Status: DC
  Filled 2012-03-30 (×4): qty 1

## 2012-03-30 MED ORDER — MORPHINE SULFATE 4 MG/ML IJ SOLN
INTRAMUSCULAR | Status: AC
  Filled 2012-03-30: qty 1

## 2012-03-30 MED ORDER — ACETAMINOPHEN 650 MG RE SUPP: 650.0000 mg | RECTAL | Status: DC | PRN

## 2012-03-30 MED ORDER — POTASSIUM CHLORIDE IN NACL 20-0.9 MEQ/L-% IV SOLN
INTRAVENOUS | Status: DC
  Administered 2012-03-30 – 2012-04-01 (×4): via INTRAVENOUS
  Filled 2012-03-30 (×5): qty 1000

## 2012-03-30 MED ORDER — BISACODYL 5 MG PO TBEC
5.0000 mg | DELAYED_RELEASE_TABLET | Freq: Every day | ORAL | Status: DC | PRN
  Filled 2012-03-30: qty 1

## 2012-03-30 MED ORDER — LIDOCAINE HCL (CARDIAC) 20 MG/ML IV SOLN
INTRAVENOUS | Status: DC | PRN
  Administered 2012-03-30: 50 mg via INTRAVENOUS

## 2012-03-30 MED ORDER — ACETAMINOPHEN 325 MG PO TABS: 650.0000 mg | ORAL_TABLET | ORAL | Status: DC | PRN

## 2012-03-30 MED ORDER — SODIUM CHLORIDE 0.9 % IR SOLN
Status: DC | PRN
  Administered 2012-03-30 (×2): 1

## 2012-03-30 MED ORDER — MICROFIBRILLAR COLL HEMOSTAT EX PADS
MEDICATED_PAD | CUTANEOUS | Status: DC | PRN
  Administered 2012-03-30: 1 via TOPICAL

## 2012-03-30 MED ORDER — MIDAZOLAM HCL 5 MG/5ML IJ SOLN
INTRAMUSCULAR | Status: DC | PRN
  Administered 2012-03-30: 1 mg via INTRAVENOUS

## 2012-03-30 MED ORDER — GLYCOPYRROLATE 0.2 MG/ML IJ SOLN
INTRAMUSCULAR | Status: DC | PRN
  Administered 2012-03-30: .6 mg via INTRAVENOUS

## 2012-03-30 MED ORDER — LABETALOL HCL 5 MG/ML IV SOLN: 10.0000 mg | INTRAVENOUS | Status: DC | PRN

## 2012-03-30 MED ORDER — SENNA 8.6 MG PO TABS
1.0000 | ORAL_TABLET | Freq: Two times a day (BID) | ORAL | Status: DC
  Administered 2012-03-30 – 2012-04-05 (×7): 8.6 mg via ORAL
  Filled 2012-03-30 (×15): qty 1

## 2012-03-30 MED ORDER — HEMOSTATIC AGENTS (NO CHARGE) OPTIME
TOPICAL | Status: DC | PRN
  Administered 2012-03-30: 1 via TOPICAL

## 2012-03-30 MED ORDER — EPHEDRINE SULFATE 50 MG/ML IJ SOLN
INTRAMUSCULAR | Status: DC | PRN
  Administered 2012-03-30 (×3): 5 mg via INTRAVENOUS

## 2012-03-30 MED ORDER — FENTANYL CITRATE 0.05 MG/ML IJ SOLN
INTRAMUSCULAR | Status: DC | PRN
  Administered 2012-03-30 (×3): 50 ug via INTRAVENOUS

## 2012-03-30 MED ORDER — DEXAMETHASONE SODIUM PHOSPHATE 10 MG/ML IJ SOLN
6.0000 mg | Freq: Four times a day (QID) | INTRAMUSCULAR | Status: AC
  Administered 2012-03-30 – 2012-03-31 (×4): 6 mg via INTRAVENOUS
  Filled 2012-03-30 (×4): qty 0.6

## 2012-03-30 MED ORDER — DEXAMETHASONE SODIUM PHOSPHATE 10 MG/ML IJ SOLN
INTRAMUSCULAR | Status: AC
  Filled 2012-03-30: qty 1

## 2012-03-30 MED ORDER — LEVETIRACETAM 500 MG PO TABS
500.0000 mg | ORAL_TABLET | Freq: Three times a day (TID) | ORAL | Status: DC
  Administered 2012-03-30 – 2012-04-05 (×18): 500 mg via ORAL
  Filled 2012-03-30 (×26): qty 1

## 2012-03-30 MED ORDER — LIDOCAINE-EPINEPHRINE 0.5 %-1:200000 IJ SOLN
INTRAMUSCULAR | Status: DC | PRN
  Administered 2012-03-30: 3 mL

## 2012-03-30 MED ORDER — SODIUM CHLORIDE 0.9 % IV SOLN
INTRAVENOUS | Status: DC | PRN
  Administered 2012-03-30 (×2): via INTRAVENOUS

## 2012-03-30 MED ORDER — ONDANSETRON HCL 4 MG PO TABS: 4.0000 mg | ORAL_TABLET | ORAL | Status: DC | PRN

## 2012-03-30 MED ORDER — ONDANSETRON HCL 4 MG/2ML IJ SOLN: 4.0000 mg | INTRAMUSCULAR | Status: DC | PRN

## 2012-03-30 MED ORDER — IRBESARTAN 150 MG PO TABS
150.0000 mg | ORAL_TABLET | Freq: Every day | ORAL | Status: DC
  Administered 2012-04-01 – 2012-04-05 (×5): 150 mg via ORAL
  Filled 2012-03-30 (×7): qty 1

## 2012-03-30 MED ORDER — ASPIRIN EC 81 MG PO TBEC
81.0000 mg | DELAYED_RELEASE_TABLET | Freq: Every day | ORAL | Status: DC
  Administered 2012-04-01 – 2012-04-03 (×3): 81 mg via ORAL
  Filled 2012-03-30 (×5): qty 1

## 2012-03-30 MED ORDER — BACITRACIN ZINC 500 UNIT/GM EX OINT
TOPICAL_OINTMENT | CUTANEOUS | Status: DC | PRN
  Administered 2012-03-30: 1 via TOPICAL

## 2012-03-30 MED ORDER — DEXAMETHASONE SODIUM PHOSPHATE 4 MG/ML IJ SOLN
INTRAMUSCULAR | Status: DC | PRN
  Administered 2012-03-30: 10 mg via INTRAVENOUS

## 2012-03-30 MED ORDER — PROMETHAZINE HCL 25 MG PO TABS: 12.5000 mg | ORAL_TABLET | ORAL | Status: DC | PRN

## 2012-03-30 SURGICAL SUPPLY — 112 items
BANDAGE GAUZE ELAST BULKY 4 IN (GAUZE/BANDAGES/DRESSINGS) IMPLANT
BENZOIN TINCTURE PRP APPL 2/3 (GAUZE/BANDAGES/DRESSINGS) IMPLANT
BLADE SAW GIGLI 16 STRL (MISCELLANEOUS) IMPLANT
BLADE SURG 15 STRL LF DISP TIS (BLADE) IMPLANT
BLADE SURG 15 STRL SS (BLADE)
BLADE SURG CLIPPER 3M 9600 (MISCELLANEOUS) ×2 IMPLANT
BLADE SURG ROTATE 9660 (MISCELLANEOUS) ×2 IMPLANT
BLADE ULTRA TIP 2M (BLADE) ×2 IMPLANT
BRUSH SCRUB EZ 1% IODOPHOR (MISCELLANEOUS) IMPLANT
BUR ACORN 6.0 PRECISION (BURR) ×2 IMPLANT
BUR ADDG 1.1 (BURR) IMPLANT
BUR MATCHSTICK NEURO 3.0 LAGG (BURR) ×2 IMPLANT
BUR ROUTER D-58 CRANI (BURR) ×2 IMPLANT
CANISTER SUCTION 2500CC (MISCELLANEOUS) ×2 IMPLANT
CATH VENTRIC 35X38 W/TROCAR LG (CATHETERS) IMPLANT
CLIP TI MEDIUM 6 (CLIP) IMPLANT
CLOTH BEACON ORANGE TIMEOUT ST (SAFETY) ×2 IMPLANT
CONT SPEC 4OZ CLIKSEAL STRL BL (MISCELLANEOUS) ×4 IMPLANT
CORDS BIPOLAR (ELECTRODE) ×2 IMPLANT
COVER MAYO STAND STRL (DRAPES) IMPLANT
DECANTER SPIKE VIAL GLASS SM (MISCELLANEOUS) ×2 IMPLANT
DRAIN SNY WOU 7FLT (WOUND CARE) IMPLANT
DRAIN SUBARACHNOID (WOUND CARE) IMPLANT
DRAPE CAMERA VIDEO/LASER (DRAPES) IMPLANT
DRAPE LONG LASER MIC (DRAPES) IMPLANT
DRAPE MICROSCOPE LEICA (MISCELLANEOUS) ×2 IMPLANT
DRAPE NEUROLOGICAL W/INCISE (DRAPES) ×2 IMPLANT
DRAPE ORTHO SPLIT 77X108 STRL (DRAPES)
DRAPE STERI IOBAN 125X83 (DRAPES) IMPLANT
DRAPE SURG 17X23 STRL (DRAPES) IMPLANT
DRAPE SURG IRRIG POUCH 19X23 (DRAPES) ×2 IMPLANT
DRAPE SURG ORHT 6 SPLT 77X108 (DRAPES) IMPLANT
DRAPE WARM FLUID 44X44 (DRAPE) ×2 IMPLANT
DRESSING TELFA 8X3 (GAUZE/BANDAGES/DRESSINGS) ×2 IMPLANT
DURAFORM SPONGE 2X2 SINGLE (Neuro Prosthesis/Implant) ×2 IMPLANT
DURAPREP 6ML APPLICATOR 50/CS (WOUND CARE) ×2 IMPLANT
ELECT CAUTERY BLADE 6.4 (BLADE) ×2 IMPLANT
ELECT REM PT RETURN 9FT ADLT (ELECTROSURGICAL) ×2
ELECTRODE REM PT RTRN 9FT ADLT (ELECTROSURGICAL) ×1 IMPLANT
EVACUATOR 1/8 PVC DRAIN (DRAIN) IMPLANT
EVACUATOR SILICONE 100CC (DRAIN) IMPLANT
GAUZE SPONGE 4X4 16PLY XRAY LF (GAUZE/BANDAGES/DRESSINGS) IMPLANT
GLOVE BIO SURGEON STRL SZ 6.5 (GLOVE) IMPLANT
GLOVE BIO SURGEON STRL SZ7 (GLOVE) IMPLANT
GLOVE BIO SURGEON STRL SZ7.5 (GLOVE) IMPLANT
GLOVE BIO SURGEON STRL SZ8 (GLOVE) ×2 IMPLANT
GLOVE BIO SURGEON STRL SZ8.5 (GLOVE) IMPLANT
GLOVE BIOGEL M 8.0 STRL (GLOVE) IMPLANT
GLOVE BIOGEL PI IND STRL 7.5 (GLOVE) ×1 IMPLANT
GLOVE BIOGEL PI IND STRL 8 (GLOVE) ×1 IMPLANT
GLOVE BIOGEL PI INDICATOR 7.5 (GLOVE) ×1
GLOVE BIOGEL PI INDICATOR 8 (GLOVE) ×1
GLOVE ECLIPSE 6.5 STRL STRAW (GLOVE) ×2 IMPLANT
GLOVE ECLIPSE 7.0 STRL STRAW (GLOVE) ×2 IMPLANT
GLOVE ECLIPSE 7.5 STRL STRAW (GLOVE) ×6 IMPLANT
GLOVE ECLIPSE 8.0 STRL XLNG CF (GLOVE) IMPLANT
GLOVE ECLIPSE 8.5 STRL (GLOVE) IMPLANT
GLOVE EXAM NITRILE LRG STRL (GLOVE) IMPLANT
GLOVE EXAM NITRILE MD LF STRL (GLOVE) IMPLANT
GLOVE EXAM NITRILE XL STR (GLOVE) IMPLANT
GLOVE EXAM NITRILE XS STR PU (GLOVE) IMPLANT
GLOVE INDICATOR 6.5 STRL GRN (GLOVE) IMPLANT
GLOVE INDICATOR 7.0 STRL GRN (GLOVE) IMPLANT
GLOVE INDICATOR 7.5 STRL GRN (GLOVE) IMPLANT
GLOVE INDICATOR 8.0 STRL GRN (GLOVE) IMPLANT
GLOVE INDICATOR 8.5 STRL (GLOVE) IMPLANT
GLOVE OPTIFIT SS 8.0 STRL (GLOVE) IMPLANT
GLOVE SURG SS PI 6.5 STRL IVOR (GLOVE) IMPLANT
GOWN BRE IMP SLV AUR LG STRL (GOWN DISPOSABLE) ×2 IMPLANT
GOWN BRE IMP SLV AUR XL STRL (GOWN DISPOSABLE) ×4 IMPLANT
GOWN STRL REIN 2XL LVL4 (GOWN DISPOSABLE) ×2 IMPLANT
HEMOSTAT SURGICEL 2X14 (HEMOSTASIS) ×2 IMPLANT
HOOK DURA (MISCELLANEOUS) ×2 IMPLANT
KIT BASIN OR (CUSTOM PROCEDURE TRAY) ×2 IMPLANT
KIT DRAIN CSF ACCUDRAIN (MISCELLANEOUS) IMPLANT
KIT ROOM TURNOVER OR (KITS) ×2 IMPLANT
MARKER SPHERE PSV REFLC NDI (MISCELLANEOUS) ×2 IMPLANT
NEEDLE HYPO 25X1 1.5 SAFETY (NEEDLE) ×2 IMPLANT
NEEDLE SPNL 18GX3.5 QUINCKE PK (NEEDLE) IMPLANT
NS IRRIG 1000ML POUR BTL (IV SOLUTION) ×4 IMPLANT
PACK CRANIOTOMY (CUSTOM PROCEDURE TRAY) ×2 IMPLANT
PAD EYE OVAL STERILE LF (GAUZE/BANDAGES/DRESSINGS) IMPLANT
PATTIES SURGICAL .25X.25 (GAUZE/BANDAGES/DRESSINGS) IMPLANT
PATTIES SURGICAL .5 X.5 (GAUZE/BANDAGES/DRESSINGS) IMPLANT
PATTIES SURGICAL .5 X3 (DISPOSABLE) IMPLANT
PATTIES SURGICAL 1/4 X 3 (GAUZE/BANDAGES/DRESSINGS) IMPLANT
PATTIES SURGICAL 1X1 (DISPOSABLE) IMPLANT
RUBBERBAND STERILE (MISCELLANEOUS) ×4 IMPLANT
SCREW SELF DRILL HT 1.5/4MM (Screw) ×10 IMPLANT
SPECIMEN JAR SMALL (MISCELLANEOUS) IMPLANT
SPONGE GAUZE 4X4 12PLY (GAUZE/BANDAGES/DRESSINGS) ×2 IMPLANT
SPONGE NEURO XRAY DETECT 1X3 (DISPOSABLE) IMPLANT
SPONGE SURGIFOAM ABS GEL 100 (HEMOSTASIS) ×2 IMPLANT
STAPLER VISISTAT 35W (STAPLE) ×2 IMPLANT
SUT ETHILON 3 0 FSL (SUTURE) ×2 IMPLANT
SUT ETHILON 3 0 PS 1 (SUTURE) IMPLANT
SUT NURALON 4 0 TR CR/8 (SUTURE) ×6 IMPLANT
SUT PL GUT 3 0 FS 1 (SUTURE) IMPLANT
SUT SILK 0 TIES 10X30 (SUTURE) IMPLANT
SUT STEEL 0 (SUTURE)
SUT STEEL 0 18XMFL TIE 17 (SUTURE) IMPLANT
SUT VIC AB 2-0 CT2 18 VCP726D (SUTURE) ×4 IMPLANT
SYR 20ML ECCENTRIC (SYRINGE) ×2 IMPLANT
SYR CONTROL 10ML LL (SYRINGE) ×2 IMPLANT
TAPE CLOTH SURG 4X10 WHT LF (GAUZE/BANDAGES/DRESSINGS) ×2 IMPLANT
TIP SONASTAR STD MISONIX 1.9 (TRAY / TRAY PROCEDURE) IMPLANT
TOWEL OR 17X24 6PK STRL BLUE (TOWEL DISPOSABLE) ×2 IMPLANT
TOWEL OR 17X26 10 PK STRL BLUE (TOWEL DISPOSABLE) ×2 IMPLANT
TRAY FOLEY CATH 14FRSI W/METER (CATHETERS) ×2 IMPLANT
TUBE CONNECTING 12X1/4 (SUCTIONS) ×2 IMPLANT
UNDERPAD 30X30 INCONTINENT (UNDERPADS AND DIAPERS) ×2 IMPLANT
WATER STERILE IRR 1000ML POUR (IV SOLUTION) ×2 IMPLANT

## 2012-03-30 NOTE — Anesthesia Preprocedure Evaluation (Addendum)
Anesthesia Evaluation  Patient identified by MRN, date of birth, ID band Patient awake    Reviewed: Allergy & Precautions, H&P , NPO status , Patient's Chart, lab work & pertinent test results  Airway Mallampati: I TM Distance: >3 FB Neck ROM: Full    Dental  (+) Teeth Intact and Edentulous Upper   Pulmonary asthma , sleep apnea ,          Cardiovascular hypertension,     Neuro/Psych Seizures -,     GI/Hepatic negative GI ROS, Neg liver ROS,   Endo/Other    Renal/GU Renal InsufficiencyRenal disease     Musculoskeletal   Abdominal   Peds  Hematology   Anesthesia Other Findings   Reproductive/Obstetrics                          Anesthesia Physical Anesthesia Plan  ASA: III  Anesthesia Plan: General   Post-op Pain Management:    Induction: Intravenous  Airway Management Planned: Oral ETT  Additional Equipment:   Intra-op Plan:   Post-operative Plan: Extubation in OR  Informed Consent: I have reviewed the patients History and Physical, chart, labs and discussed the procedure including the risks, benefits and alternatives for the proposed anesthesia with the patient or authorized representative who has indicated his/her understanding and acceptance.   Dental advisory given  Plan Discussed with: Anesthesiologist and Surgeon  Anesthesia Plan Comments:         Anesthesia Quick Evaluation

## 2012-03-30 NOTE — Op Note (Addendum)
03/30/2012  1:09 PM  PATIENT:  [name]  56 y.o. male  PRE-OPERATIVE DIAGNOSIS:  brain tumor  POST-OPERATIVE DIAGNOSIS:  Right Frontal brain mass  PROCEDURE:  Procedure(s): right frontal stereotactic guided CRANIOTOMY  forTUMOR EXCISION Microdissection SURGEON:  Surgeon(s): Carmela Hurt, MD Tia Alert, MD  ASSISTANTS:David Yetta Barre  ANESTHESIA:   general  EBL:  Total I/O In: 1000 [I.V.:1000] Out: 825 [Urine:825]  BLOOD ADMINISTERED:none  CELL SAVER GIVEN:none   COUNT:per nursing   DRAINS: none   SPECIMEN:  Source of Specimen:  right frontal lobe  DICTATION: [name] was brought to the operating room intubated and placed under a general anesthetic without diffculty. I placed a three pin mayfield head holder to ~60lbs of pressure. His head was then shaved. I using the preoperative stealth imaging then registered his head with the stealth system. After achieving a verified registration his head was prepped and draped. I opened the old incision and exposed the craniotomy flap. redrilled the single burr hole then used the craniotome to redrill a small portion of the flap. With that opening I was able to pry the flap up and remove it. There was a very thin dural layer over a portion of the brain but the rest of the brain was directly under the flap. Using the stealth I opened in a sulcus to gain access to the mass. I did not encounter a hard mass, nor a definable capsule. I checked my location with the stealth numerous times to ensure my location. I did not use mannitol in order to not distort the brain. I used the microscope to aid in microdissection and removed the area identified by the stealth system as being the mass. The pathologist identified edematous brain with a  Leukocytic infiltration. I did not believe this was tumor and have followed this region of abnormality for 6 years. With there being no discernible boundary I used suction and a small dissector to remove  tissue in the localized area. I with Dr. Yetta Barre inspected the area again with the stealth and it indicated that we were at the outer margin on the distal portion of the mass along with being directly inside the mass. I irrigated the wound and then closed. I will check with CT post op to confirm resection. I closed the wound by placing a piece of duragen over the brain where there was no dura. I approximated the subgaleal layer and scalp edges. I applied a sterile dressing. I removed the [facility] and he was extubated.   PLAN OF CARE: Admit to inpatient   PATIENT DISPOSITION:  PACU - hemodynamically stable.   Delay start of Pharmacological VTE agent (>24hrs) due to surgical blood loss or risk of bleeding:  yes

## 2012-03-30 NOTE — H&P (Signed)
BP 134/88  Pulse 73  Temp 97.8 F (36.6 C) (Oral)  Resp 18  SpO2 97% WU:[id number] lesion [name] is a long term patient of mine. He was taken to the or ~6years ago for a craniotomy. At that time I identified a mass, opened it and found what I thought was purulent material. He was treated with antibiotics and did well overall. 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 He returns today for removal of the mass as it has grown and is causing more edema in the surrounding brain.  Allergies  Allergen Reactions  . Iohexol Anaphylaxis and Shortness Of Breath     Code: HIVES, Desc: hives and tachycardia last time pt received IV CM kdean 07/19/06, Onset Date: [id number]   . Dilantin (Phenytoin) Hives  . Heparin Hives  . Other     Follows Kosher Diet; Needs Kosher products   Past Medical History  Diagnosis Date  . Arthritis   . DVT (deep venous thrombosis)   . Hx of pulmonary embolus     x 2  . Hypertension   . Asthma     excerise induced  . Sleep apnea     Sleep study done 5-6  years ago machine set at 10  . Patent foramen ovale   . History of transfusion of platelets   . Chronic renal insufficiency, stage III (moderate)   . Seizures   . Brain abscess   . Neuropathy   . History of osteomyelitis    Past Surgical History  Procedure Date  . Patent foramen ovale closure   . Craniotomy   . Anterior cruciate ligament repair   . Deep vein   . Pulmonary embolus   . Laparoscopic appendectomy 11/20/2011  . Laparoscopic appendectomy 11/20/2011    Procedure: APPENDECTOMY LAPAROSCOPIC;  Surgeon: Shelly Rubenstein, MD;  Location: [facility];  Service: General;  Laterality: N/A;  . Brain surgery     abscess drainage x 2  . Fracture surgery     right arm  . Carpal tunnel release   . Ulnar tunnel release    Prior to Admission medications   Medication Sig Start Date End Date Taking? Authorizing Provider  irbesartan  (AVAPRO) 150 MG tablet Take 150 mg by mouth daily.    Yes Historical Provider, MD  ketoconazole (NIZORAL) 2 % shampoo Apply 1 application topically 2 (two) times a week.   Yes Historical Provider, MD  levETIRAcetam (KEPPRA) 500 MG tablet Take 500 mg by mouth 3 (three) times daily.    Yes Historical Provider, MD  pantoprazole (PROTONIX) 40 MG tablet Take 40 mg by mouth daily.   Yes Historical Provider, MD  albuterol (PROVENTIL HFA;VENTOLIN HFA) 108 (90 BASE) MCG/ACT inhaler Inhale 2 puffs into the lungs every 6 (six) hours as needed. For shortness of breath    Historical Provider, MD  aspirin 81 MG tablet Take 81 mg by mouth daily.    Historical Provider, MD   History   Social History  . Marital Status: Married    Spouse Name: N/A    Number of Children: N/A  . Years of Education: N/A   Occupational History  . Not on file.   Social History Main Topics  . Smoking status: Never Smoker   . Smokeless tobacco: Never Used  . Alcohol Use: No  . Drug Use: No  . Sexually Active: No   Other Topics Concern  .  Not on file   Social History Narrative  . No narrative on file   History reviewed. No pertinent family history.   [name] returns today so that we could actually go over the MRI.  The lesion is slightly bigger.  There is more edema.    We spoke at great length, well over a half hour today face-to-face, he, his wife and I.  We talked about what needs to be done.  He wants to really see if this has anything to do with just swelling as opposed to the mass.  I will give him a Decadron taper and I prescribed that for him today over the next 7 days.    On examination, there is no drift.  5/5 strength. Pupils are equal, round, reactive to light, symmetric facial sensation.  Tongue and uvula are in the midline.  Shoulder shrug is normal.  We spoke a great length about a resection, about if he has to do it when he can do it.  We can wait on this.  The lesion is slightly bigger but it has not  become humongous.  Resecting it would be no different now than it would be in six months in terms of difficulty.  We discussed an open craniotomy and an awake craniotomy but that is not something he could tolerate.  We discussed the actual operation and I think he may have some motor deficits afterwards.  He would need 2-3 weeks off work.  This does not increase his risks of seizures.  He is on Keppra and has been doing just fine.    We will try the taper.  He will get back with me about when he thinks he can take it out.  I think it needs to come out no matter what.  It has grown and there is more edema and that will only continue.  He is 56 years of age and has a long life ahead of him.   He is admitted today for resection of the mass. Risks including bleeding infection, stroke, coma, death, brain damage, left hemiplegia, left sided paralysis, were discussed along with other risks. He understands and wishes to proceed.

## 2012-03-30 NOTE — Anesthesia Postprocedure Evaluation (Signed)
  Anesthesia Post-op Note  Patient: [name]  Procedure(s) Performed: Procedure(s) (LRB) with comments: CRANIOTOMY TUMOR EXCISION (Right) -  craniotomy for intracranial mass.  Patient Location: PACU  Anesthesia Type: General  Level of Consciousness: awake  Airway and Oxygen Therapy: Patient Spontanous Breathing  Post-op Pain: mild  Post-op Assessment: Post-op Vital signs reviewed  Post-op Vital Signs: Reviewed  Complications: No apparent anesthesia complications

## 2012-03-30 NOTE — Plan of Care (Signed)
Problem: Consults Goal: Diagnosis - Craniotomy Craniotomy for tumor removal

## 2012-03-30 NOTE — Transfer of Care (Signed)
Immediate Anesthesia Transfer of Care Note  Patient: [name]  Procedure(s) Performed: Procedure(s) (LRB) with comments: CRANIOTOMY TUMOR EXCISION (Right) -  craniotomy for intracranial mass.  Patient Location: PACU  Anesthesia Type: General  Level of Consciousness: awake, alert  and oriented  Airway & Oxygen Therapy: Patient Spontanous Breathing and Patient connected to nasal cannula oxygen  Post-op Assessment: Report given to PACU RN and Post -op Vital signs reviewed and stable  Post vital signs: Reviewed and stable  Complications: No apparent anesthesia complications

## 2012-03-30 NOTE — Anesthesia Procedure Notes (Addendum)
Procedure Name: Intubation Performed by: Sheppard Evens Pre-anesthesia Checklist: Patient identified, Emergency Drugs available, Suction available and Patient being monitored Patient Re-evaluated:Patient Re-evaluated prior to inductionOxygen Delivery Method: Circle system utilized Preoxygenation: Pre-oxygenation with 100% oxygen Intubation Type: IV induction Ventilation: Mask ventilation without difficulty Laryngoscope Size: Miller and 2 Grade View: Grade I Tube type: Oral Tube size: 7.5 mm Number of attempts: 1 Airway Equipment and Method: Stylet Placement Confirmation: ETT inserted through vocal cords under direct vision,  positive ETCO2 and breath sounds checked- equal and bilateral Secured at: 23 cm Tube secured with: Tape Dental Injury: Teeth and Oropharynx as per pre-operative assessment       Narrative:

## 2012-03-30 NOTE — Progress Notes (Signed)
Patient ID: [name], male   DOB: 04-30-1956, 56 y.o.   MRN: [id number] BP 144/87  Pulse 89  Temp 98.3 F (36.8 C) (Oral)  Resp 18  Ht 5\' 7"  (1.702 m)  Wt 79.9 kg (176 lb 2.4 oz)  BMI 27.59 kg/m2  SpO2 93% Alert and oriented x 4 Moving all extremities well Wound dressing is dry  Peerl, full eom Speech is clear and fluent Will repeat ct tomorrow morning.

## 2012-03-31 ENCOUNTER — Encounter (HOSPITAL_COMMUNITY): Payer: Self-pay | Admitting: Neurosurgery

## 2012-03-31 ENCOUNTER — Encounter (HOSPITAL_COMMUNITY): Payer: Self-pay | Admitting: *Deleted

## 2012-03-31 ENCOUNTER — Encounter (HOSPITAL_COMMUNITY)
Admission: RE | Disposition: A | Discharge status: Home / Self Care | Payer: Self-pay | Source: Ambulatory Visit | Attending: Neurosurgery

## 2012-03-31 ENCOUNTER — Inpatient Hospital Stay (HOSPITAL_COMMUNITY): Payer: 59

## 2012-03-31 ENCOUNTER — Inpatient Hospital Stay (HOSPITAL_COMMUNITY): Payer: 59 | Admitting: *Deleted

## 2012-03-31 HISTORY — PX: CRANIOTOMY: SHX93

## 2012-03-31 LAB — POCT I-STAT 4, (NA,K, GLUC, HGB,HCT)
Glucose, Bld: 123 mg/dL — ABNORMAL HIGH (ref 70–99)
HCT: 35 % — ABNORMAL LOW (ref 39.0–52.0)
Hemoglobin: 11.9 g/dL — ABNORMAL LOW (ref 13.0–17.0)
Potassium: 4 mEq/L (ref 3.5–5.1)

## 2012-03-31 SURGERY — CRANIOTOMY TUMOR EXCISION
Anesthesia: General | Site: Head | Laterality: Right | Wound class: Clean

## 2012-03-31 MED ORDER — DEXAMETHASONE SODIUM PHOSPHATE 4 MG/ML IJ SOLN
INTRAMUSCULAR | Status: DC | PRN
  Administered 2012-03-31: 6 mg via INTRAVENOUS

## 2012-03-31 MED ORDER — ROCURONIUM BROMIDE 100 MG/10ML IV SOLN
INTRAVENOUS | Status: DC | PRN
  Administered 2012-03-31: 50 mg via INTRAVENOUS
  Administered 2012-03-31 (×2): 20 mg via INTRAVENOUS
  Administered 2012-03-31: 30 mg via INTRAVENOUS
  Administered 2012-03-31: 20 mg via INTRAVENOUS
  Administered 2012-03-31: 10 mg via INTRAVENOUS

## 2012-03-31 MED ORDER — LIDOCAINE HCL (CARDIAC) 20 MG/ML IV SOLN
INTRAVENOUS | Status: DC | PRN
  Administered 2012-03-31: 20 mg via INTRAVENOUS

## 2012-03-31 MED ORDER — SURGIFOAM 100 EX MISC
CUTANEOUS | Status: DC | PRN
  Administered 2012-03-31: 17:00:00 via TOPICAL

## 2012-03-31 MED ORDER — DIPHENHYDRAMINE HCL 50 MG/ML IJ SOLN
50.0000 mg | Freq: Once | INTRAMUSCULAR | Status: AC
  Administered 2012-03-31: 50 mg via INTRAVENOUS
  Filled 2012-03-31: qty 1

## 2012-03-31 MED ORDER — SODIUM CHLORIDE 0.9 % IV SOLN
INTRAVENOUS | Status: DC | PRN
  Administered 2012-03-31 (×2): via INTRAVENOUS

## 2012-03-31 MED ORDER — PROPOFOL 10 MG/ML IV BOLUS
INTRAVENOUS | Status: DC | PRN
  Administered 2012-03-31: 170 mg via INTRAVENOUS

## 2012-03-31 MED ORDER — VECURONIUM BROMIDE 10 MG IV SOLR
INTRAVENOUS | Status: DC | PRN
  Administered 2012-03-31: 2 mg via INTRAVENOUS

## 2012-03-31 MED ORDER — PANTOPRAZOLE SODIUM 40 MG PO TBEC
40.0000 mg | DELAYED_RELEASE_TABLET | Freq: Every day | ORAL | Status: DC
  Administered 2012-03-31: 40 mg via ORAL
  Filled 2012-03-31: qty 1

## 2012-03-31 MED ORDER — MICROFIBRILLAR COLL HEMOSTAT EX PADS
MEDICATED_PAD | CUTANEOUS | Status: DC | PRN
  Administered 2012-03-31: 1 via TOPICAL

## 2012-03-31 MED ORDER — GLYCOPYRROLATE 0.2 MG/ML IJ SOLN
INTRAMUSCULAR | Status: DC | PRN
  Administered 2012-03-31: 0.6 mg via INTRAVENOUS

## 2012-03-31 MED ORDER — IOHEXOL 300 MG/ML  SOLN
80.0000 mL | Freq: Once | INTRAMUSCULAR | Status: AC | PRN
  Administered 2012-03-31: 80 mL via INTRAVENOUS

## 2012-03-31 MED ORDER — IOHEXOL 300 MG/ML  SOLN: 100.0000 mL | Freq: Once | INTRAMUSCULAR | Status: DC | PRN

## 2012-03-31 MED ORDER — HYDROMORPHONE HCL PF 1 MG/ML IJ SOLN
INTRAMUSCULAR | Status: AC
  Administered 2012-03-31: 0.5 mg via INTRAVENOUS
  Filled 2012-03-31: qty 1

## 2012-03-31 MED ORDER — BACITRACIN ZINC 500 UNIT/GM EX OINT
TOPICAL_OINTMENT | CUTANEOUS | Status: DC | PRN
  Administered 2012-03-31: 1 via TOPICAL

## 2012-03-31 MED ORDER — CEFAZOLIN SODIUM 1-5 GM-% IV SOLN
INTRAVENOUS | Status: DC | PRN
  Administered 2012-03-31: 2 g via INTRAVENOUS

## 2012-03-31 MED ORDER — SODIUM CHLORIDE 0.9 % IV SOLN
INTRAVENOUS | Status: DC | PRN
  Administered 2012-03-31: 17:00:00 via INTRAVENOUS

## 2012-03-31 MED ORDER — MANNITOL 25 % IV SOLN
INTRAVENOUS | Status: DC | PRN
  Administered 2012-03-31: 37.5 g via INTRAVENOUS

## 2012-03-31 MED ORDER — ONDANSETRON HCL 4 MG/2ML IJ SOLN
INTRAMUSCULAR | Status: DC | PRN
  Administered 2012-03-31: 4 mg via INTRAVENOUS

## 2012-03-31 MED ORDER — NEOSTIGMINE METHYLSULFATE 1 MG/ML IJ SOLN
INTRAMUSCULAR | Status: DC | PRN
  Administered 2012-03-31: 5 mg via INTRAVENOUS

## 2012-03-31 MED ORDER — FENTANYL CITRATE 0.05 MG/ML IJ SOLN
INTRAMUSCULAR | Status: DC | PRN
  Administered 2012-03-31: 100 ug via INTRAVENOUS
  Administered 2012-03-31: 150 ug via INTRAVENOUS

## 2012-03-31 MED ORDER — 0.9 % SODIUM CHLORIDE (POUR BTL) OPTIME
TOPICAL | Status: DC | PRN
  Administered 2012-03-31 (×2): 1000 mL

## 2012-03-31 MED ORDER — HYDROMORPHONE HCL PF 1 MG/ML IJ SOLN
0.2500 mg | INTRAMUSCULAR | Status: DC | PRN
  Administered 2012-03-31 (×2): 0.5 mg via INTRAVENOUS

## 2012-03-31 MED ORDER — MIDAZOLAM HCL 5 MG/5ML IJ SOLN
INTRAMUSCULAR | Status: DC | PRN
  Administered 2012-03-31: 2 mg via INTRAVENOUS

## 2012-03-31 MED ORDER — PHENYLEPHRINE HCL 10 MG/ML IJ SOLN
10.0000 mg | INTRAVENOUS | Status: DC | PRN
  Administered 2012-03-31: 20 ug/min via INTRAVENOUS

## 2012-03-31 MED ORDER — CEFAZOLIN SODIUM-DEXTROSE 2-3 GM-% IV SOLR
INTRAVENOUS | Status: AC
  Filled 2012-03-31: qty 100

## 2012-03-31 SURGICAL SUPPLY — 108 items
BANDAGE GAUZE ELAST BULKY 4 IN (GAUZE/BANDAGES/DRESSINGS) IMPLANT
BENZOIN TINCTURE PRP APPL 2/3 (GAUZE/BANDAGES/DRESSINGS) IMPLANT
BLADE SAW GIGLI 16 STRL (MISCELLANEOUS) IMPLANT
BLADE SURG 15 STRL LF DISP TIS (BLADE) IMPLANT
BLADE SURG 15 STRL SS (BLADE)
BLADE SURG ROTATE 9660 (MISCELLANEOUS) IMPLANT
BLADE ULTRA TIP 2M (BLADE) IMPLANT
BRUSH SCRUB EZ 1% IODOPHOR (MISCELLANEOUS) IMPLANT
BUR ACORN 6.0 PRECISION (BURR) IMPLANT
BUR ADDG 1.1 (BURR) IMPLANT
BUR MATCHSTICK NEURO 3.0 LAGG (BURR) IMPLANT
BUR ROUTER D-58 CRANI (BURR) IMPLANT
CANISTER SUCTION 2500CC (MISCELLANEOUS) ×2 IMPLANT
CATH VENTRIC 35X38 W/TROCAR LG (CATHETERS) ×2 IMPLANT
CLIP TI MEDIUM 6 (CLIP) IMPLANT
CLOTH BEACON ORANGE TIMEOUT ST (SAFETY) ×2 IMPLANT
CONT SPEC 4OZ CLIKSEAL STRL BL (MISCELLANEOUS) ×2 IMPLANT
CORDS BIPOLAR (ELECTRODE) ×2 IMPLANT
COVER MAYO STAND STRL (DRAPES) IMPLANT
DECANTER SPIKE VIAL GLASS SM (MISCELLANEOUS) ×2 IMPLANT
DRAIN SNY WOU 7FLT (WOUND CARE) IMPLANT
DRAIN SUBARACHNOID (WOUND CARE) IMPLANT
DRAPE CAMERA VIDEO/LASER (DRAPES) IMPLANT
DRAPE LONG LASER MIC (DRAPES) IMPLANT
DRAPE MICROSCOPE LEICA (MISCELLANEOUS) ×2 IMPLANT
DRAPE NEUROLOGICAL W/INCISE (DRAPES) ×2 IMPLANT
DRAPE ORTHO SPLIT 77X108 STRL (DRAPES)
DRAPE STERI IOBAN 125X83 (DRAPES) IMPLANT
DRAPE SURG 17X23 STRL (DRAPES) IMPLANT
DRAPE SURG IRRIG POUCH 19X23 (DRAPES) IMPLANT
DRAPE SURG ORHT 6 SPLT 77X108 (DRAPES) IMPLANT
DRAPE WARM FLUID 44X44 (DRAPE) ×2 IMPLANT
DRESSING TELFA 8X3 (GAUZE/BANDAGES/DRESSINGS) ×2 IMPLANT
DURAFORM SPONGE 2X2 SINGLE (Neuro Prosthesis/Implant) ×2 IMPLANT
DURAPREP 6ML APPLICATOR 50/CS (WOUND CARE) ×2 IMPLANT
ELECT CAUTERY BLADE 6.4 (BLADE) ×2 IMPLANT
ELECT REM PT RETURN 9FT ADLT (ELECTROSURGICAL) ×2
ELECTRODE REM PT RTRN 9FT ADLT (ELECTROSURGICAL) ×1 IMPLANT
EVACUATOR 1/8 PVC DRAIN (DRAIN) IMPLANT
EVACUATOR SILICONE 100CC (DRAIN) IMPLANT
GAUZE SPONGE 4X4 16PLY XRAY LF (GAUZE/BANDAGES/DRESSINGS) IMPLANT
GLOVE BIO SURGEON STRL SZ 6.5 (GLOVE) IMPLANT
GLOVE BIO SURGEON STRL SZ7 (GLOVE) IMPLANT
GLOVE BIO SURGEON STRL SZ7.5 (GLOVE) IMPLANT
GLOVE BIO SURGEON STRL SZ8 (GLOVE) IMPLANT
GLOVE BIO SURGEON STRL SZ8.5 (GLOVE) IMPLANT
GLOVE BIOGEL M 8.0 STRL (GLOVE) IMPLANT
GLOVE BIOGEL PI IND STRL 7.5 (GLOVE) ×2 IMPLANT
GLOVE BIOGEL PI INDICATOR 7.5 (GLOVE) ×2
GLOVE ECLIPSE 6.5 STRL STRAW (GLOVE) ×2 IMPLANT
GLOVE ECLIPSE 7.0 STRL STRAW (GLOVE) IMPLANT
GLOVE ECLIPSE 7.5 STRL STRAW (GLOVE) ×6 IMPLANT
GLOVE ECLIPSE 8.0 STRL XLNG CF (GLOVE) IMPLANT
GLOVE ECLIPSE 8.5 STRL (GLOVE) IMPLANT
GLOVE EXAM NITRILE LRG STRL (GLOVE) IMPLANT
GLOVE EXAM NITRILE MD LF STRL (GLOVE) IMPLANT
GLOVE EXAM NITRILE XL STR (GLOVE) IMPLANT
GLOVE EXAM NITRILE XS STR PU (GLOVE) IMPLANT
GLOVE INDICATOR 6.5 STRL GRN (GLOVE) IMPLANT
GLOVE INDICATOR 7.0 STRL GRN (GLOVE) IMPLANT
GLOVE INDICATOR 7.5 STRL GRN (GLOVE) ×4 IMPLANT
GLOVE INDICATOR 8.0 STRL GRN (GLOVE) IMPLANT
GLOVE INDICATOR 8.5 STRL (GLOVE) IMPLANT
GLOVE OPTIFIT SS 8.0 STRL (GLOVE) IMPLANT
GLOVE SURG SS PI 6.5 STRL IVOR (GLOVE) IMPLANT
GOWN BRE IMP SLV AUR LG STRL (GOWN DISPOSABLE) ×2 IMPLANT
GOWN BRE IMP SLV AUR XL STRL (GOWN DISPOSABLE) ×6 IMPLANT
GOWN STRL REIN 2XL LVL4 (GOWN DISPOSABLE) IMPLANT
HEMOSTAT SURGICEL 2X14 (HEMOSTASIS) ×2 IMPLANT
KIT BASIN OR (CUSTOM PROCEDURE TRAY) ×2 IMPLANT
KIT DRAIN CSF ACCUDRAIN (MISCELLANEOUS) IMPLANT
KIT ROOM TURNOVER OR (KITS) ×2 IMPLANT
NEEDLE HYPO 25X1 1.5 SAFETY (NEEDLE) ×2 IMPLANT
NEEDLE SPNL 18GX3.5 QUINCKE PK (NEEDLE) IMPLANT
NS IRRIG 1000ML POUR BTL (IV SOLUTION) ×2 IMPLANT
PACK CRANIOTOMY (CUSTOM PROCEDURE TRAY) ×2 IMPLANT
PAD EYE OVAL STERILE LF (GAUZE/BANDAGES/DRESSINGS) IMPLANT
PATTIES SURGICAL .25X.25 (GAUZE/BANDAGES/DRESSINGS) ×2 IMPLANT
PATTIES SURGICAL .5 X.5 (GAUZE/BANDAGES/DRESSINGS) IMPLANT
PATTIES SURGICAL .5 X3 (DISPOSABLE) IMPLANT
PATTIES SURGICAL 1/4 X 3 (GAUZE/BANDAGES/DRESSINGS) IMPLANT
PATTIES SURGICAL 1X1 (DISPOSABLE) IMPLANT
RUBBERBAND STERILE (MISCELLANEOUS) ×4 IMPLANT
SCREW SELF DRILL HT 1.5/3.5MM (Screw) ×10 IMPLANT
SPECIMEN JAR SMALL (MISCELLANEOUS) IMPLANT
SPONGE GAUZE 4X4 12PLY (GAUZE/BANDAGES/DRESSINGS) ×2 IMPLANT
SPONGE NEURO XRAY DETECT 1X3 (DISPOSABLE) IMPLANT
SPONGE SURGIFOAM ABS GEL 100 (HEMOSTASIS) ×2 IMPLANT
STAPLER VISISTAT 35W (STAPLE) ×2 IMPLANT
SUT ETHILON 3 0 FSL (SUTURE) ×2 IMPLANT
SUT ETHILON 3 0 PS 1 (SUTURE) IMPLANT
SUT NURALON 4 0 TR CR/8 (SUTURE) ×4 IMPLANT
SUT PL GUT 3 0 FS 1 (SUTURE) IMPLANT
SUT SILK 0 TIES 10X30 (SUTURE) IMPLANT
SUT STEEL 0 (SUTURE)
SUT STEEL 0 18XMFL TIE 17 (SUTURE) IMPLANT
SUT VIC AB 2-0 CT2 18 VCP726D (SUTURE) ×4 IMPLANT
SUTURE REMOVAL KIT ×2 IMPLANT
SYR 20ML ECCENTRIC (SYRINGE) ×2 IMPLANT
SYR CONTROL 10ML LL (SYRINGE) ×2 IMPLANT
TAPE CLOTH SURG 4X10 WHT LF (GAUZE/BANDAGES/DRESSINGS) ×2 IMPLANT
TIP SONASTAR STD MISONIX 1.9 (TRAY / TRAY PROCEDURE) IMPLANT
TOWEL OR 17X24 6PK STRL BLUE (TOWEL DISPOSABLE) ×2 IMPLANT
TOWEL OR 17X26 10 PK STRL BLUE (TOWEL DISPOSABLE) ×2 IMPLANT
TRAY FOLEY CATH 14FRSI W/METER (CATHETERS) IMPLANT
TUBE CONNECTING 12X1/4 (SUCTIONS) IMPLANT
UNDERPAD 30X30 INCONTINENT (UNDERPADS AND DIAPERS) IMPLANT
WATER STERILE IRR 1000ML POUR (IV SOLUTION) ×2 IMPLANT

## 2012-03-31 NOTE — Progress Notes (Signed)
Patient ID: [name], male   DOB: Mar 18, 1956, 55 y.o.   MRN: [id number] BP 100/57  Pulse 65  Temp 98.4 F (36.9 C) (Oral)  Resp 17  Ht 5\' 7"  (1.702 m)  Wt 79.9 kg (176 lb 2.4 oz)  BMI 27.59 kg/m2  SpO2 94% CT this am showed residual mass. I will take him back to the OR for resection. Although the frameless system showed that I was in the mass, there is still something left. I will use ultrasound today. I have explained the situation to [name] and my reason to take him back to the OR. He is doing well post op and I would expect him to continue to do well. Risks including but not limited too stroke, coma, death, infection, bleeding, brain damage, paralysis, weakness, change in personality inability to resect the mass were discussed. He understands and wishes to proceed.

## 2012-03-31 NOTE — Progress Notes (Signed)
Pt has complaint of numbness in left side of the mouth and tip of nose. MD aware. No new orders.

## 2012-03-31 NOTE — Anesthesia Postprocedure Evaluation (Signed)
Anesthesia Post Note  Patient: [name]  Procedure(s) Performed: Procedure(s) (LRB): CRANIOTOMY TUMOR EXCISION (Right)  Anesthesia type: general  Patient location: PACU  Post pain: Pain level controlled  Post assessment: Patient's Cardiovascular Status Stable  Last Vitals:  Filed Vitals:   03/31/12 2100  BP: 117/66  Pulse: 75  Temp:   Resp: 19    Post vital signs: Reviewed and stable  Level of consciousness: sedated  Complications: No apparent anesthesia complications

## 2012-03-31 NOTE — Progress Notes (Signed)
Nursing 0900 Dr. Franky Macho made aware that patient was unable to have CT with contrast completed this AM due to allergy.  Dr. Franky Macho to bedside and is ok with patient having CT with contrast completed because patient has been receiving Decadron IV.  Will order Benadryl IV to be given prior to CT.  RN made CT tech aware of this.  Ok for patient to have CT with contrast completed per Radiology MD.  RN instructed to give Benadryl 50mg  IV once patient in radiology.  Patient taken to radiology by RN, RN administered Benadryl 50mg  IV. IV contrast given by CT personnel.  Patient did report itching in his eyes during the CT, but the itching resolved once patient was back to his room.  RN instructed patient to notify nurse if any changes occur.  Will continue to assess.

## 2012-03-31 NOTE — Anesthesia Preprocedure Evaluation (Signed)
Anesthesia Evaluation  Patient identified by MRN, date of birth, ID band Patient awake    Reviewed: Allergy & Precautions, H&P , NPO status , Patient's Chart, lab work & pertinent test results  Airway       Dental   Pulmonary asthma , sleep apnea ,          Cardiovascular hypertension,     Neuro/Psych    GI/Hepatic negative GI ROS, Neg liver ROS,   Endo/Other  negative endocrine ROS  Renal/GU Renal disease     Musculoskeletal   Abdominal   Peds  Hematology   Anesthesia Other Findings   Reproductive/Obstetrics                           Anesthesia Physical Anesthesia Plan  ASA: III  Anesthesia Plan: General   Post-op Pain Management:    Induction: Intravenous  Airway Management Planned: Oral ETT  Additional Equipment: Arterial line  Intra-op Plan:   Post-operative Plan: Possible Post-op intubation/ventilation  Informed Consent: I have reviewed the patients History and Physical, chart, labs and discussed the procedure including the risks, benefits and alternatives for the proposed anesthesia with the patient or authorized representative who has indicated his/her understanding and acceptance.   Dental advisory given  Plan Discussed with: Anesthesiologist, CRNA and Surgeon  Anesthesia Plan Comments:         Anesthesia Quick Evaluation

## 2012-03-31 NOTE — Op Note (Signed)
03/30/2012 - 03/31/2012  8:39 PM  PATIENT:  [name]  56 y.o. male  PRE-OPERATIVE DIAGNOSIS:  Brain Mass right frontal  POST-OPERATIVE DIAGNOSIS:  Brain Mass right frontal  PROCEDURE:  Procedure(s): CRANIOTOMY TUMOR EXCISION  SURGEON:  Surgeon(s): Carmela Hurt, MD Reinaldo Meeker, MD  ASSISTANTS:Kritzer, randy  ANESTHESIA:   general  EBL:  Total I/O In: 1700 [I.V.:1700] Out: 475 [Urine:400; Blood:75]  BLOOD ADMINISTERED:none  CELL SAVER GIVEN:none  COUNT:per nursing  DRAINS: none   SPECIMEN:  Source of Specimen:  right frontal lobe  DICTATION: Mr. Town returns to the operating room for resection of a right frontal mass. With stereotactic localization I was unable to find a mass consistent with the preoperative imaging. Repeat head CT today showed the lesion to still be present. I recommended and he agreed to undergo another craniotomy to resect the mass.  He was brought to the operating room intubated and placed under a general anesthetic without difficulty. I placed his head on a horseshoe head rest. His head was turned to the left on the headrest. I removed the sutures before he was prepped. I then prepped his head and afterwards he was draped in a sterile fashion.  I opened the skin incision using a scalpel to cut my vicryl sutures. I placed a cerebellar retractor and removed the craniotomy flap by removing the screws on the flap.  I used the ultrasound to locate the tumor by following a ventricular catheter i had placed in the mass.  Dr. Gerlene Fee and I then used the catheter to guide [place] to the mass. It was quite hard, smooth, and yellowish white. The mass was sent in one piece to pathology for diagnosis. With microdissection we worked around the mass with bipolar cautery and dissectors to separate the mass from the brain tissue. We removed the mass in one piece in its entirety. I achieved hemostasis then irrigated. Under the scope I did not appreciate bleeding, and  the irrigant remained clear.  I then placed a piece of duragen over the brain, since there was not enough dura to sew it to.  I replaced the bone flap with plates and screws. I approximated the subgaleal layer with vicryl sutures and the scalp with a running nylon suture. I applied a sterile dressing. He was then extubated and taken to the recovery room.   PLAN OF CARE: Admit to inpatient   PATIENT DISPOSITION:  PACU - hemodynamically stable.   Delay start of Pharmacological VTE agent (>24hrs) due to surgical blood loss or risk of bleeding:  yes

## 2012-03-31 NOTE — OR Nursing (Signed)
Dr. Franky Macho reimplanted the same plate and used new screws.

## 2012-03-31 NOTE — Preoperative (Signed)
Beta Blockers   Reason not to administer Beta Blockers:Not Applicable 

## 2012-03-31 NOTE — Progress Notes (Signed)
Patient ID: [name], male   DOB: [birth year], 56 y.o.   MRN: [id number] BP 117/66  Pulse 75  Temp 99.8 F (37.7 C) (Oral)  Resp 19  Ht 5\' 7"  (1.702 m)  Wt 79.9 kg (176 lb 2.4 oz)  BMI 27.59 kg/m2  SpO2 97% Alert and oriented x 4 Weakness in his left hand, unable to fully open and close the hand. Weakness also in the left upper extremity.  Dressing intact and dry All other extremities moving well.

## 2012-03-31 NOTE — Transfer of Care (Signed)
Immediate Anesthesia Transfer of Care Note  Patient: [name]  Procedure(s) Performed: Procedure(s) (LRB) with comments: CRANIOTOMY TUMOR EXCISION (Right) - Craniotomy for excision of mass  Patient Location: PACU  Anesthesia Type: General  Level of Consciousness: awake and alert   Airway & Oxygen Therapy: Patient Spontanous Breathing and Patient connected to face mask oxygen  Post-op Assessment: Report given to PACU RN and Post -op Vital signs reviewed and stable  Post vital signs: Reviewed and stable  Complications: No apparent anesthesia complications

## 2012-04-01 ENCOUNTER — Encounter (HOSPITAL_COMMUNITY): Payer: Self-pay | Admitting: Neurosurgery

## 2012-04-01 DIAGNOSIS — G9389 Other specified disorders of brain: Secondary | ICD-10-CM | POA: Diagnosis present

## 2012-04-01 MED ORDER — DEXAMETHASONE 4 MG PO TABS
4.0000 mg | ORAL_TABLET | Freq: Three times a day (TID) | ORAL | Status: DC
  Administered 2012-04-01 – 2012-04-05 (×13): 4 mg via ORAL
  Filled 2012-04-01 (×15): qty 1

## 2012-04-01 MED ORDER — PANTOPRAZOLE SODIUM 40 MG PO TBEC
40.0000 mg | DELAYED_RELEASE_TABLET | Freq: Every day | ORAL | Status: DC
  Administered 2012-04-01 – 2012-04-05 (×5): 40 mg via ORAL
  Filled 2012-04-01: qty 13
  Filled 2012-04-01 (×4): qty 1

## 2012-04-01 NOTE — Progress Notes (Signed)
Patient ID: [name], male   DOB: 1956-01-21, 56 y.o.   MRN: [id number] BP 151/107  Pulse 84  Temp 98.5 F (36.9 C) (Oral)  Resp 16  Ht 5\' 7"  (1.702 m)  Wt 79.9 kg (176 lb 2.4 oz)  BMI 27.59 kg/m2  SpO2 95% Alert and oriented x 4, speech is clear and fluent Perrl, full eom Weakness in left hand, improved from last night Wound dressing is dry and intact. No pathology yet.

## 2012-04-02 NOTE — Progress Notes (Signed)
Subjective: Patient reports Feels fair but reports left arm hangs at the shoulder has about 3/5 strength in more proximal distal musculature  Objective: Vital signs in last 24 hours: Temp:  [97.7 F (36.5 C)-98.5 F (36.9 C)] 98.5 F (36.9 C) (09/21 0400) Pulse Rate:  [49-85] 52  (09/21 0700) Resp:  [13-32] 19  (09/21 0700) BP: (122-156)/(79-108) 146/96 mmHg (09/21 0700) SpO2:  [91 %-97 %] 94 % (09/21 0700) Arterial Line BP: (140)/(97) 140/97 mmHg (09/20 1100)  Intake/Output from previous day: 09/20 0701 - 09/21 0700 In: 1500 [P.O.:1220; I.V.:280] Out: 2790 [Urine:2790] Intake/Output this shift:    Incision is clean and dry dressing intact left upper extremity weakness but moves left lower extremity quite well  Lab Results:  Basename 03/31/12 1650  WBC --  HGB 11.9*  HCT 35.0*  PLT --   BMET  Basename 03/31/12 1650  NA 142  K 4.0  CL --  CO2 --  GLUCOSE 123*  BUN --  CREATININE --  CALCIUM --    Studies/Results: Ct Head W Wo Contrast  03/31/2012  *RADIOLOGY REPORT*  Clinical Data: 56 year old male status post stereotactic surgery for chronic right frontal lobe lesion.  Evaluate for residual lesion, possible reoperation.  CT HEAD WITHOUT AND WITH CONTRAST  Technique:  Contiguous axial images were obtained from the base of the skull through the vertex without and with intravenous contrast.  Contrast: 80mL OMNIPAQUE IOHEXOL 300 MG/ML  SOLN, 1 OMNIPAQUE IOHEXOL 300 MG/ML  SOLN The patient is already on Decadron q6 hours.  50 mg of IV Benadryl was administered just prior to this exam.  No immediate contrast reaction occurred.  Comparison: 03/21/2012 and earlier.  Findings: Right frontal craniotomy changes again noted.  Interval postoperative changes to the scalp with a small volume superficial postoperative fluid and gas.  Small left posterior convexity skin staples.  Other scalp and osseous structures are stable. Visualized orbit soft tissues are within normal limits.   Visualized paranasal sinuses and mastoids are clear.  Persistent 12-13 mm diameter hyperdense and enhancing lesion in the right posterior superior frontal gyrus.  Surrounding edema/white matter hypodensity not significantly changed.  As before no significant mass effect.  Trace pneumocephalus including in a gyrus or sulcus immediately posterior to the lesion (see series 4 image 20).  Mild postoperative gas underlying the bone flap.  No other extra- axial collection.  No ventriculomegaly.  Stable gray-white matter differentiation outside of the right superior frontal gyrus. No evidence of cortically based acute infarction identified.  No other abnormal enhancement.  IMPRESSION: 1.  12 -13 mm diameter hyperdense and enhancing right posterior superior frontal gyrus lesion re-identified.  Trace postoperative gas in a gyrus or sulcus just posterior to the lesion (see series 4 image 20). 2.  Interval postoperative changes at the craniotomy site and scalp. 3.  No new intracranial abnormality.   Original Report Authenticated By: Harley Hallmark, M.D.    [place] Intraoperative  03/31/2012  CLINICAL DATA: Craniotomy for Tumor resection   Ultrasound was provided for use by the ordering physician, and a technical  charge was applied by the performing facility.  No radiologist  interpretation/professional services rendered.      Assessment/Plan: Stable post  LOS: 3 days  Observe in intensive care unit the patient is nearly ready for transfer out   Harlean Regula J 04/02/2012, 10:34 AM

## 2012-04-02 NOTE — Progress Notes (Signed)
Orthopedic Tech Progress Note Patient Details:  [name] [birth year] [id number]  Ortho Devices Type of Ortho Device: Sling immobilizer   [name] 04/02/2012, 6:17 AM

## 2012-04-02 NOTE — Progress Notes (Signed)
Patient able to ambulate throughout the unit (400 ft)  x 2 with no shortness of breath or increase in BP

## 2012-04-03 NOTE — Progress Notes (Signed)
Patient ID: [name], male   DOB: Nov 03, 1955, 56 y.o.   MRN: [id number] Appears to be doing well left upper extremity function appears to be improving further. Had pain resolving. Transferred to step down unit last night the patient is ready for transfer to 4 N.

## 2012-04-03 NOTE — Progress Notes (Signed)
Patient transferred to 3308; receiving RN present at bedside. Patient verbalized that he plans to send his spouse a message later this morning and make her aware of his new room assignment.

## 2012-04-03 NOTE — Progress Notes (Signed)
Report called to [employer], RN on dept [phone]. Patient provided with an update re: room assignment 3308.

## 2012-04-03 NOTE — Progress Notes (Signed)
Pt transferred to 4N room 9 in wheelchair with NT. Medications sent with patient in chart. Receiving RN aware and full report given.

## 2012-04-04 NOTE — Progress Notes (Signed)
Patient ID: [name], male   DOB: 03/06/1956, 56 y.o.   MRN: [id number] BP 122/88  Pulse 64  Temp 97 F (36.1 C) (Oral)  Resp 17  Ht 5\' 7"  (1.702 m)  Wt 79.9 kg (176 lb 2.4 oz)  BMI 27.59 kg/m2  SpO2 95% Alert and oriented x 4 Speech is clear and fluent Weak in left hand, upper extremity Improved strength however Wound is clean and dry.  Dc tomorrow

## 2012-04-05 NOTE — Care Management Note (Signed)
    Page 1 of 1   04/05/2012     3:40:49 PM   CARE MANAGEMENT NOTE 04/05/2012  Patient:  [name], [name]   Account Number:  [id number]  Date Initiated:  03/31/2012  Documentation initiated by:  Jacquelynn Cree  Subjective/Objective Assessment:   Admitted postop craniotomy for excision of brain tumor     Action/Plan:   patient ambulates independently   Anticipated DC Date:  04/05/2012   Anticipated DC Plan:  HOME/SELF CARE      DC Planning Services  CM consult      Choice offered to / List presented to:             Status of service:  Completed, signed off Medicare Important Message given?   (If response is "NO", the following Medicare IM given date fields will be blank) Date Medicare IM given:   Date Additional Medicare IM given:    Discharge Disposition:  HOME/SELF CARE  Per UR Regulation:  Reviewed for med. necessity/level of care/duration of stay  If discussed at Long Length of Stay Meetings, dates discussed:    Comments:

## 2012-04-05 NOTE — Discharge Summary (Signed)
Physician Discharge Summary  Patient ID: [name] MRN: [id number] DOB/AGE: 10-04-1955 56 y.o.  Admit date: 03/30/2012 Discharge date: 04/05/2012  Admission Diagnoses: right frontal mass  Discharge Diagnoses: Right frontal abcess Principal Problem:  *Cerebral mass   Discharged Condition: good  Hospital Course: 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 He was treated with antibiotics but retained a persistent mass which enhanced and caused cerebral edema. This mass enlarged this summer and caused more edema so I recommended he undergo a craniotomy to resect the mass.  [name] was admitted to the hospital and taken to the operating room on 9/18 for a stereotactic guided craniotomy. At the time of his operation, although I was on target, and in the mass per the stealth system, I was not satisfied that I had actually found the mass. I took biopsies, closed, and [name] went to the ICU. He underwent a CT scan w/wo contrast on POD#1 which showed the lesion still in the brain. I therefore took him back to the or where he underwent a second craniotomy through the same incision and craniotomy flap and with ultrasound identified a hard, smooth mass which was clearly not brain tissue, relatively avascular and with a dense capsule. Pathology returned with a diagnosis of abcess. I believe it was the capsule which persisted after his antibiotic treatment.  Post op he had weakness in the left upper extremity especially in the left hand, which was expected as this happened 6 years ago with the first craniotomy. This improved a great deal by the time of his discharge today. The rest of his neurologic exam is normal. He will remain on an increased dose of keppra for a month before returning to his standard dose. Wound is clean and dry at discharge.  Consults: None  Significant Diagnostic Studies: Head CT  Treatments:  surgery: Right frontal redo craniotomy x 2  Discharge Exam: Blood pressure 145/89, pulse 76, temperature 98.4 F (36.9 C), temperature source Oral, resp. rate 18, height 5\' 7"  (1.702 m), weight 79.9 kg (176 lb 2.4 oz), SpO2 98.00%. General appearance: alert, cooperative and appears stated age plegic in left upper extremity 4/5 strength. Poor coordination left hand.  Disposition: 01-Home or Self Care     Medication List     As of 04/05/2012  6:04 PM    TAKE these medications         albuterol 108 (90 BASE) MCG/ACT inhaler   Commonly known as: PROVENTIL HFA;VENTOLIN HFA   Inhale 2 puffs into the lungs every 6 (six) hours as needed. For shortness of breath      aspirin 81 MG tablet   Take 81 mg by mouth daily.      irbesartan 150 MG tablet   Commonly known as: AVAPRO   Take 150 mg by mouth daily.      ketoconazole 2 % shampoo   Commonly known as: NIZORAL   Apply 1 application topically 2 (two) times a week.      levETIRAcetam 500 MG tablet   Commonly known as: KEPPRA   Take 500 mg by mouth 3 (three) times daily.      pantoprazole 40 MG tablet   Commonly known as: PROTONIX   Take 40 mg by mouth daily.           Follow-up Information    Follow up with Leroy Papania L, MD. (1 week for suture removal)  Contact information:   [address] [place] [address] [phone]          Signed: Lorenda Lewis Lewis 04/05/2012, 6:04 PM

## 2012-04-14 ENCOUNTER — Encounter: Payer: Self-pay | Admitting: Internal Medicine

## 2012-04-14 ENCOUNTER — Ambulatory Visit (INDEPENDENT_AMBULATORY_CARE_PROVIDER_SITE_OTHER): Payer: 59 | Admitting: Internal Medicine

## 2012-04-14 VITALS — BP 135/87 | HR 93 | Temp 98.0°F | Ht 67.0 in | Wt 175.8 lb

## 2012-04-14 DIAGNOSIS — R220 Localized swelling, mass and lump, head: Secondary | ICD-10-CM

## 2012-04-14 DIAGNOSIS — G9389 Other specified disorders of brain: Secondary | ICD-10-CM

## 2012-04-14 MED ORDER — METRONIDAZOLE 500 MG PO TABS: 500.0000 mg | ORAL_TABLET | Freq: Three times a day (TID) | ORAL | Status: DC

## 2012-04-14 NOTE — Assessment & Plan Note (Addendum)
It appears that the initial infection never quite clear. A gram-positive rod is consistent with the initial culture of the Eubacteria linosum.  This is typically a colonic bacteria and since he has had a colonoscopy, there is no other workup indicated. He did have a PFO that was repaired. At this time, I will have him start Flagyl again to take for about 2 months to assure resolution. He also is planning to have a repeat CAT scan by neurosurgery around that time and he was instructed to call me if there any concerns on the CAT scan but otherwise he will return on a p.r.n. Basis. He prefers to not schedule an appointment unless necessary. I did give him contact information if there any concerns at that time or prior to that. I do feel that most of the treatment at this time is simply clearing out anything that might be left since for the most part the lesion has been removed.  Thank you for allowing me to participate in his care.  Greater than 45 minutes was spent in face-to-face contact and correlation of care including reviewing appropriate therapy for this unusual case

## 2012-04-14 NOTE — Progress Notes (Signed)
  Subjective:    Patient ID: [name], male    DOB: 04-19-1956, 56 y.o.   MRN: [id number]  HPI This patient comes in for new evaluation of a brain lesion. 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 He was noted to have a right frontal mass and did undergo surgery which cleaned out the mass and was notable for pus and did grow 2 organisms including Peptostreptococcus as well as Eubacteria linosum.  He was started on appropriate therapy with IV ceftriaxone and Flagyl and completed a 6 week course. Over the subsequent years, he had periodic followup with MRIs that did show some very mild progression of the area. He was having though no significant symptoms. The with the progression, the decision was made by neurosurgery to go back in and remove the lesion. This was done on September 18.  During surgery after localization, the mass was removed in one piece S. After separation from the brain tissue. This was then sent to pathology and the pathology report does show it to be consistent with a gram-positive rod.  Of note, he has had colonoscopies for routine screening as well as he did have the discovery of a PFO in his heart at that time of initial surgery which was subsequently repaired.   Review of Systems  Constitutional: Negative for fever, chills, fatigue and unexpected weight change.  Cardiovascular: Negative for chest pain, palpitations and leg swelling.  Gastrointestinal: Negative for nausea, abdominal pain and diarrhea.  Musculoskeletal: Negative for myalgias and arthralgias.       Some left arm dysfunction that is improved  Neurological: Negative for dizziness and headaches.       Objective:   Physical Exam  Constitutional: He appears well-developed and well-nourished. No distress.  Cardiovascular: Normal rate, regular rhythm and normal heart sounds.  Exam reveals no gallop and no friction rub.   No  murmur heard.         Assessment & Plan:

## 2012-04-18 ENCOUNTER — Ambulatory Visit: Payer: 59 | Admitting: Infectious Diseases

## 2012-04-20 ENCOUNTER — Telehealth: Payer: Self-pay | Admitting: *Deleted

## 2012-04-20 DIAGNOSIS — G9389 Other specified disorders of brain: Secondary | ICD-10-CM

## 2012-04-20 MED ORDER — AMOXICILLIN-POT CLAVULANATE 875-125 MG PO TABS: 1.0000 | ORAL_TABLET | Freq: Two times a day (BID) | ORAL | Status: DC

## 2012-04-20 NOTE — Telephone Encounter (Signed)
Patient called stating he had an allergic reaction, rash to Flagyl.  He forgot that several years ago he was on IV flagyl and had a similar reaction.  He has stopped it and is taking benadryl, please advise. Wendall Mola CMA

## 2012-04-20 NOTE — Telephone Encounter (Signed)
Also, patient said he thought he was suppose to be on an antibiotic for 3 months.

## 2012-04-20 NOTE — Telephone Encounter (Signed)
Yes, 875/125, three months

## 2012-04-20 NOTE — Telephone Encounter (Signed)
Augmentin 875 mg po bid for same duration, 2 months.  List flagyl as allergy.  Thanks

## 2012-04-20 NOTE — Telephone Encounter (Signed)
Ok, and that's the Augmentin 875-125? Wendall Mola

## 2012-04-22 ENCOUNTER — Telehealth: Payer: Self-pay

## 2012-04-22 MED ORDER — HYDROXYZINE HCL 25 MG PO TABS: 25.0000 mg | ORAL_TABLET | Freq: Three times a day (TID) | ORAL | Status: DC | PRN

## 2012-04-22 NOTE — Telephone Encounter (Signed)
Pt states he is still  itching even with the Benadryl 50 mg every 4 hours .   Pt states it is very irritating, even with the topical hydrocortisone.   Is there something you can give for the itching?  Please advise.   Laurell Josephs, RN

## 2012-04-22 NOTE — Telephone Encounter (Signed)
Can try atarax 25 mg 3-4 times per day and stop the Benadryl.  Thanks

## 2012-08-19 ENCOUNTER — Other Ambulatory Visit (HOSPITAL_COMMUNITY): Payer: Self-pay | Admitting: Neurosurgery

## 2012-08-19 DIAGNOSIS — R251 Tremor, unspecified: Secondary | ICD-10-CM

## 2012-08-22 ENCOUNTER — Ambulatory Visit (HOSPITAL_COMMUNITY)
Admission: RE | Admit: 2012-08-22 | Discharge: 2012-08-22 | Disposition: A | Discharge status: Home / Self Care | Payer: 59 | Source: Ambulatory Visit | Attending: Neurosurgery | Admitting: Neurosurgery

## 2012-08-22 DIAGNOSIS — R251 Tremor, unspecified: Secondary | ICD-10-CM

## 2012-08-22 DIAGNOSIS — G9389 Other specified disorders of brain: Secondary | ICD-10-CM | POA: Insufficient documentation

## 2012-08-22 DIAGNOSIS — G988 Other disorders of nervous system: Secondary | ICD-10-CM | POA: Insufficient documentation

## 2012-08-22 MED ORDER — GADOBENATE DIMEGLUMINE 529 MG/ML IV SOLN
17.0000 mL | Freq: Once | INTRAVENOUS | Status: AC | PRN
  Administered 2012-08-22: 17 mL via INTRAVENOUS

## 2012-08-23 LAB — POCT I-STAT, CHEM 8
Creatinine, Ser: 1.5 mg/dL — ABNORMAL HIGH (ref 0.50–1.35)
HCT: 44 % (ref 39.0–52.0)
Hemoglobin: 15 g/dL (ref 13.0–17.0)
Potassium: 3.9 mEq/L (ref 3.5–5.1)
Sodium: 139 mEq/L (ref 135–145)
TCO2: 31 mmol/L (ref 0–100)

## 2013-02-26 ENCOUNTER — Emergency Department (HOSPITAL_COMMUNITY): Payer: 59

## 2013-02-26 ENCOUNTER — Encounter (HOSPITAL_COMMUNITY): Payer: Self-pay | Admitting: *Deleted

## 2013-02-26 ENCOUNTER — Emergency Department (HOSPITAL_COMMUNITY)
Admission: EM | Admit: 2013-02-26 | Discharge: 2013-02-26 | Disposition: A | Discharge status: Home / Self Care | Payer: 59 | Attending: Emergency Medicine | Admitting: Emergency Medicine

## 2013-02-26 DIAGNOSIS — M129 Arthropathy, unspecified: Secondary | ICD-10-CM | POA: Insufficient documentation

## 2013-02-26 DIAGNOSIS — I129 Hypertensive chronic kidney disease with stage 1 through stage 4 chronic kidney disease, or unspecified chronic kidney disease: Secondary | ICD-10-CM | POA: Insufficient documentation

## 2013-02-26 DIAGNOSIS — Z87798 Personal history of other (corrected) congenital malformations: Secondary | ICD-10-CM | POA: Insufficient documentation

## 2013-02-26 DIAGNOSIS — J45909 Unspecified asthma, uncomplicated: Secondary | ICD-10-CM | POA: Insufficient documentation

## 2013-02-26 DIAGNOSIS — Z8739 Personal history of other diseases of the musculoskeletal system and connective tissue: Secondary | ICD-10-CM | POA: Insufficient documentation

## 2013-02-26 DIAGNOSIS — G40909 Epilepsy, unspecified, not intractable, without status epilepticus: Secondary | ICD-10-CM | POA: Insufficient documentation

## 2013-02-26 DIAGNOSIS — IMO0002 Reserved for concepts with insufficient information to code with codable children: Secondary | ICD-10-CM | POA: Insufficient documentation

## 2013-02-26 DIAGNOSIS — Y92410 Unspecified street and highway as the place of occurrence of the external cause: Secondary | ICD-10-CM | POA: Insufficient documentation

## 2013-02-26 DIAGNOSIS — Z86711 Personal history of pulmonary embolism: Secondary | ICD-10-CM | POA: Insufficient documentation

## 2013-02-26 DIAGNOSIS — Y9389 Activity, other specified: Secondary | ICD-10-CM | POA: Insufficient documentation

## 2013-02-26 DIAGNOSIS — Z7982 Long term (current) use of aspirin: Secondary | ICD-10-CM | POA: Insufficient documentation

## 2013-02-26 DIAGNOSIS — Z86718 Personal history of other venous thrombosis and embolism: Secondary | ICD-10-CM | POA: Insufficient documentation

## 2013-02-26 DIAGNOSIS — Z8669 Personal history of other diseases of the nervous system and sense organs: Secondary | ICD-10-CM | POA: Insufficient documentation

## 2013-02-26 DIAGNOSIS — N183 Chronic kidney disease, stage 3 unspecified: Secondary | ICD-10-CM | POA: Insufficient documentation

## 2013-02-26 DIAGNOSIS — T148XXA Other injury of unspecified body region, initial encounter: Secondary | ICD-10-CM | POA: Insufficient documentation

## 2013-02-26 MED ORDER — CYCLOBENZAPRINE HCL 10 MG PO TABS: 10.0000 mg | ORAL_TABLET | Freq: Two times a day (BID) | ORAL | Status: DC | PRN
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
## 2013-03-02 NOTE — ED Provider Notes (Signed)
Medical screening examination/treatment/procedure(s) were performed by non-physician practitioner and as supervising physician I was immediately available for consultation/collaboration.   Kadiatou Oplinger E Niranjan Rufener, MD 03/02/13 1525 

## 2013-09-29 ENCOUNTER — Telehealth: Payer: Self-pay | Admitting: Internal Medicine

## 2013-09-29 NOTE — Telephone Encounter (Signed)
Transferred pt to medical records.

## 2013-11-17 ENCOUNTER — Encounter: Payer: Self-pay | Admitting: Internal Medicine

## 2014-01-02 ENCOUNTER — Institutional Professional Consult (permissible substitution): Payer: 59 | Admitting: Internal Medicine

## 2014-01-25 ENCOUNTER — Encounter: Payer: Self-pay | Admitting: Internal Medicine

## 2014-01-25 ENCOUNTER — Encounter (INDEPENDENT_AMBULATORY_CARE_PROVIDER_SITE_OTHER): Payer: Self-pay

## 2014-01-25 ENCOUNTER — Ambulatory Visit (INDEPENDENT_AMBULATORY_CARE_PROVIDER_SITE_OTHER): Payer: 59 | Admitting: Internal Medicine

## 2014-01-25 VITALS — BP 130/82 | HR 72 | Ht 67.0 in | Wt 195.0 lb

## 2014-01-25 DIAGNOSIS — G609 Hereditary and idiopathic neuropathy, unspecified: Secondary | ICD-10-CM

## 2014-01-25 DIAGNOSIS — G4733 Obstructive sleep apnea (adult) (pediatric): Secondary | ICD-10-CM

## 2014-01-25 DIAGNOSIS — G9389 Other specified disorders of brain: Secondary | ICD-10-CM

## 2014-01-25 DIAGNOSIS — I2699 Other pulmonary embolism without acute cor pulmonale: Secondary | ICD-10-CM

## 2014-01-25 DIAGNOSIS — R221 Localized swelling, mass and lump, neck: Secondary | ICD-10-CM

## 2014-01-25 DIAGNOSIS — K219 Gastro-esophageal reflux disease without esophagitis: Secondary | ICD-10-CM

## 2014-01-25 DIAGNOSIS — R220 Localized swelling, mass and lump, head: Secondary | ICD-10-CM

## 2014-01-25 NOTE — Patient Instructions (Signed)
Order- schedule unattended home sleep study    Dx OSA  Order- [facility]- Download for pressure / compliance

## 2014-01-25 NOTE — Progress Notes (Signed)
01/25/14- 58 yoM never smoker referred courtesy of Dr Willette Pa study over 58 years old. He was diagnosed with obstructive sleep apnea at the old office over 10 years ago and has been using CPAP ever since said on 10/[facility]. The current machine is several years old. He is told he is snoring to his mask. Bedtime 1 to 2 AM, short latency, waking much before up between 6:30 and 7 AM. No ENT surgery. He does not use the humidifier by choice. He observes that he sleeps poorly away from home and anticipates he would have trouble repeating a sleep study. There is no history of significant movement disturbance or other behavioral abnormality. 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 He had associated seizures and is left with chronic weakness left hand. Additional history of  Seasonal allergic rhinitis which has required prednisone, hypertension, blood clots. Chronic kidney disease stage III, GERD.  Prior to Admission medications   Medication Sig Start Date End Date Taking? Authorizing Provider  albuterol (PROVENTIL HFA;VENTOLIN HFA) 108 (90 BASE) MCG/ACT inhaler Inhale 2 puffs into the lungs every 6 (six) hours as needed. For shortness of breath   Yes Historical Provider, MD  aspirin 81 MG tablet Take 81 mg by mouth daily.   Yes Historical Provider, MD  azelastine (ASTELIN) 0.1 % nasal spray  11/27/13  Yes Historical Provider, MD  irbesartan (AVAPRO) 300 MG tablet Take 300 mg by mouth daily.   Yes Historical Provider, MD  levETIRAcetam (KEPPRA) 500 MG tablet Take 500 mg by mouth 2 (two) times daily.    Yes Historical Provider, MD  mometasone-formoterol (DULERA) 200-5 MCG/ACT AERO Inhale 2 puffs into the lungs 2 (two) times daily.   Yes Historical Provider, MD  pantoprazole (PROTONIX) 40 MG tablet Take 40 mg by mouth daily.   Yes Historical Provider, MD   Past Medical History  Diagnosis  Date  . Arthritis   . DVT (deep venous thrombosis)   . Hx of pulmonary embolus     x 2  . Hypertension   . Asthma     excerise induced  . Sleep apnea     Sleep study done 58-6  years ago machine set at 10  . Patent foramen ovale   . History of transfusion of platelets   . Chronic renal insufficiency, stage III (moderate)   . Seizures   . Brain abscess   . Neuropathy   . History of osteomyelitis    Past Surgical History  Procedure Laterality Date  . Patent foramen ovale closure    . Craniotomy    . Anterior cruciate ligament repair    . Deep vein    . Pulmonary embolus    . Laparoscopic appendectomy  11/20/2011  . Laparoscopic appendectomy  11/20/2011    Procedure: APPENDECTOMY LAPAROSCOPIC;  Surgeon: Harl Bowie, MD;  Location: [facility];  Service: General;  Laterality: N/A;  . Brain surgery      abscess drainage x 2  . Fracture surgery      right arm  . Carpal tunnel release    . Ulnar tunnel release    . Craniotomy  03/30/2012    Procedure: CRANIOTOMY TUMOR EXCISION;  Surgeon: Winfield Cunas, MD;  Location: [facility];  Service: Neurosurgery;  Laterality: Right;   craniotomy for intracranial mass.  . Craniotomy  03/31/2012    Procedure: CRANIOTOMY TUMOR EXCISION;  Surgeon: Winfield Cunas, MD;  Location: [facility];  Service: Neurosurgery;  Laterality: Right;  Craniotomy for excision of mass   Family History  Problem Relation Age of Onset  . Allergies      whole family  . Asthma      whole family   History   Social History  . Marital Status: Married    Spouse Name: N/A    Number of Children: 28  . Years of Education: N/A   Occupational History  .     Social History Main Topics  . Smoking status: Never Smoker   . Smokeless tobacco: Never Used  . Alcohol Use: No     Comment: quit 58 years ago  . Drug Use: No  . Sexual Activity: No   Other Topics Concern  . Not on file   Social History Narrative  . No narrative on file   ROS-see  HPI Constitutional:   No-   weight loss, night sweats, fevers, chills, fatigue, lassitude. HEENT:   No-  headaches, difficulty swallowing, tooth/dental problems, sore throat,       No-  sneezing, itching, ear ache, nasal congestion, post nasal drip,  CV:  No-   chest pain, orthopnea, PND, swelling in lower extremities, anasarca,                                  dizziness, palpitations Resp: No-   shortness of breath with exertion or at rest.              No-   productive cough,  No non-productive cough,  No- coughing up of blood.              No-   change in color of mucus.  No- wheezing.   Skin: No-   rash or lesions. GI:  No-   heartburn, indigestion, abdominal pain, nausea, vomiting, diarrhea,                 change in bowel habits, loss of appetite GU: No-   dysuria, change in color of urine, no urgency or frequency.  No- flank pain. MS:  No-   joint pain or swelling.  No- decreased range of motion.  No- back pain. Neuro-    + Chronic neuropathic weakness left hand Psych:  No- change in mood or affect. No depression or anxiety.  No memory loss.  OBJ- Physical Exam General- Alert, Oriented, Affect-appropriate, Distress- none acute Skin- rash-none, lesions- none, excoriation- none Lymphadenopathy- none Head- atraumatic            Eyes- Gross vision intact, PERRLA, conjunctivae and secretions clear            Ears- Hearing, canals-normal            Nose- Clear, no-Septal dev, mucus, polyps, erosion, perforation             Throat- Mallampati III , mucosa clear , drainage- none, tonsils- atrophic Neck- flexible , trachea midline, no stridor , thyroid nl, carotid no bruit Chest - symmetrical excursion , unlabored           Heart/CV- RRR , no murmur , no gallop  , no rub, nl s1 s2                           - JVD- none , edema- none, stasis changes- none, varices- none  Lung- clear to P&A, wheeze- none, cough- none , dullness-none, rub- none           Chest wall-  Abd- tender-no,  distended-no, bowel sounds-present, HSM- no Br/ Gen/ Rectal- Not done, not indicated Extrem- cyanosis- none, clubbing, none, atrophy- none, strength- nl Neuro- articulate, weakness left hand by history, not examined

## 2014-01-28 DIAGNOSIS — K219 Gastro-esophageal reflux disease without esophagitis: Secondary | ICD-10-CM | POA: Insufficient documentation

## 2014-01-28 DIAGNOSIS — G609 Hereditary and idiopathic neuropathy, unspecified: Secondary | ICD-10-CM | POA: Insufficient documentation

## 2014-01-28 DIAGNOSIS — I2699 Other pulmonary embolism without acute cor pulmonale: Secondary | ICD-10-CM | POA: Insufficient documentation

## 2014-01-28 NOTE — Assessment & Plan Note (Addendum)
Current machine is several years old and original diagnosis was made more than 10 years ago. He has been compliant. Needs redocumentation-old studies no longer available. He has expressed some concern about ability to sleep away from home. He may be an appropriate candidate for an unattended home sleep study. We can then auto titrate for pressure evaluation.

## 2014-03-01 DIAGNOSIS — G4733 Obstructive sleep apnea (adult) (pediatric): Secondary | ICD-10-CM

## 2014-03-02 ENCOUNTER — Telehealth: Payer: Self-pay | Admitting: Internal Medicine

## 2014-03-02 DIAGNOSIS — G4733 Obstructive sleep apnea (adult) (pediatric): Secondary | ICD-10-CM

## 2014-03-02 NOTE — Telephone Encounter (Signed)
Therefore his test was not accurate.   I spoke with patient- he is aware of results and requests to have a formal in lab split night study done. Pt aware order has been placed and PCC's will call next week with appt date and time.

## 2014-03-07 ENCOUNTER — Other Ambulatory Visit: Payer: Self-pay | Admitting: Gastroenterology

## 2014-03-22 ENCOUNTER — Ambulatory Visit (HOSPITAL_BASED_OUTPATIENT_CLINIC_OR_DEPARTMENT_OTHER): Payer: 59 | Attending: Internal Medicine

## 2014-03-22 VITALS — Ht 67.0 in | Wt 190.0 lb

## 2014-03-22 DIAGNOSIS — G4733 Obstructive sleep apnea (adult) (pediatric): Secondary | ICD-10-CM | POA: Diagnosis present

## 2014-03-24 DIAGNOSIS — G4733 Obstructive sleep apnea (adult) (pediatric): Secondary | ICD-10-CM

## 2014-03-24 NOTE — Sleep Study (Signed)
   NAME: [name] DATE OF BIRTH:  04/14/1956 MEDICAL RECORD NUMBER [id number]  LOCATION: [facility]  PHYSICIAN: Jerico Grisso D  DATE OF STUDY: 03/22/2014  SLEEP STUDY TYPE: Nocturnal Polysomnogram               REFERRING PHYSICIAN: Baird Lyons D, MD  INDICATION FOR STUDY: Hypersomnia with sleep apnea  EPWORTH SLEEPINESS SCORE:   8/24 HEIGHT: 5\' 7"  (170.2 cm)  WEIGHT: 190 lb (86.183 kg)    Body mass index is 29.75 kg/(m^2).  NECK SIZE: 14 in.  MEDICATIONS: Charted for review  SLEEP ARCHITECTURE: Split study protocol. During the diagnostic phase, total sleep time 121.5 minutes with sleep efficiency 66.8%. Stage I was 3.7%, stage II 96.3%, stage III and REM were absent. Sleep latency 27.5 minutes, awake after sleep onset 33 minutes, arousal index 2.0, bedtime medication: None  RESPIRATORY DATA: Apnea hypopneas index (AHI) 30.6 per hour. 62 total events scored including 26 obstructive apneas, 6 central apneas, 4 mixed apneas, 26 hypopneas. Events were seen in all positions, especially supine. CPAP titrated to 9 CWP, AHI 0 per hour. He wore a large fullface mask.  OXYGEN DATA: Loud snoring before CPAP with oxygen desaturation to a nadir of 78% on room air. With CPAP control, snoring was prevented and mean oxygen saturation was 93.8% on room air.  CARDIAC DATA: Sinus rhythm  MOVEMENT/PARASOMNIA: No significant movement disturbance, bathroom x1  IMPRESSION/ RECOMMENDATION:   1) Severe obstructive sleep apnea/hypopneas syndrome, AHI 30.6 per hour with non-positional events loud snoring with oxygen desaturation to a nadir of 78% on room air  2) Successful CPAP titration to 9 CWP, AHI 0 per hour. He wore a large [employer] Simplus fullface mask. He chose not to use a humidifier. Snoring was prevented and mean oxygen saturation of 93.8% on room air.  Deneise Lever Diplomate, American Board of Sleep Medicine  ELECTRONICALLY SIGNED ON:  03/24/2014,  10:41 AM [place] PH: [phone]   FX: [phone] [place]

## 2014-03-25 DIAGNOSIS — G4733 Obstructive sleep apnea (adult) (pediatric): Secondary | ICD-10-CM

## 2014-03-26 ENCOUNTER — Encounter: Payer: Self-pay | Admitting: Internal Medicine

## 2014-04-02 ENCOUNTER — Ambulatory Visit: Payer: 59 | Admitting: Internal Medicine

## 2014-05-11 ENCOUNTER — Other Ambulatory Visit: Payer: Self-pay | Admitting: Family Medicine

## 2014-05-11 DIAGNOSIS — E041 Nontoxic single thyroid nodule: Secondary | ICD-10-CM

## 2014-05-17 ENCOUNTER — Ambulatory Visit
Admission: RE | Admit: 2014-05-17 | Discharge: 2014-05-17 | Disposition: A | Discharge status: Home / Self Care | Payer: 59 | Source: Ambulatory Visit | Attending: Family Medicine | Admitting: Family Medicine

## 2014-05-17 DIAGNOSIS — E041 Nontoxic single thyroid nodule: Secondary | ICD-10-CM

## 2014-05-21 ENCOUNTER — Ambulatory Visit: Payer: 59 | Admitting: Internal Medicine

## 2014-05-21 ENCOUNTER — Encounter: Payer: Self-pay | Admitting: Internal Medicine

## 2014-05-21 VITALS — BP 126/88 | HR 93 | Ht 67.0 in | Wt 193.4 lb

## 2014-05-21 DIAGNOSIS — G4733 Obstructive sleep apnea (adult) (pediatric): Secondary | ICD-10-CM

## 2014-05-21 NOTE — Progress Notes (Signed)
01/25/14- 69 yoM never smoker referred courtesy of Dr Willette Pa study over 58 years old. He was diagnosed with obstructive sleep apnea at the old office over 10 years ago and has been using CPAP ever since said on 10/[facility]. The current machine is several years old. He is told he is snoring through his mask. Bedtime 1 to 2 AM, short latency, waking  between 6:30 and 7 AM. No ENT surgery. He does not use the humidifier by choice. He observes that he sleeps poorly away from home and anticipates he would have trouble repeating a sleep study. There is no history of significant movement disturbance or other behavioral abnormality. 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 He had associated seizures and is left with chronic weakness left hand. Additional history of  Seasonal allergic rhinitis which has required prednisone, hypertension, blood clots. Chronic kidney disease stage III, GERD.  05/21/14- 57 yoM never smoker followed for OSA, complicated by hx brain abscess/craniotomy/seizure, seasonal allergic rhinitis, HBP, CKD, VTE, GERD NPSG 03/22/14 AHI 30.6/ hr, CPAP 9, weight 190 lbs Here to discuss sleep study results.  No changes from last OV.   ROS-see HPI Constitutional:   No-   weight loss, night sweats, fevers, chills, fatigue, lassitude. HEENT:   No-  headaches, difficulty swallowing, tooth/dental problems, sore throat,       No-  sneezing, itching, ear ache, nasal congestion, post nasal drip,  CV:  No-   chest pain, orthopnea, PND, swelling in lower extremities, anasarca,                                  dizziness, palpitations Resp: No-   shortness of breath with exertion or at rest.              No-   productive cough,  No non-productive cough,  No- coughing up of blood.              No-   change in color of mucus.  No- wheezing.   Skin: No-   rash or lesions. GI:  No-    heartburn, indigestion, abdominal pain, nausea, vomiting, diarrhea,                 change in bowel habits, loss of appetite GU: No-   dysuria, change in color of urine, no urgency or frequency.  No- flank pain. MS:  No-   joint pain or swelling.  No- decreased range of motion.  No- back pain. Neuro-    + Chronic neuropathic weakness left hand Psych:  No- change in mood or affect. No depression or anxiety.  No memory loss.  OBJ- Physical Exam General- Alert, Oriented, Affect-appropriate, Distress- none acute Skin- rash-none, lesions- none, excoriation- none Lymphadenopathy- none Head- atraumatic            Eyes- Gross vision intact, PERRLA, conjunctivae and secretions clear            Ears- Hearing, canals-normal            Nose- Clear, no-Septal dev, mucus, polyps, erosion, perforation             Throat- Mallampati III , mucosa clear , drainage- none, tonsils- atrophic Neck- flexible , trachea midline, no stridor , thyroid nl, carotid no bruit Chest - symmetrical excursion , unlabored  Heart/CV- RRR , no murmur , no gallop  , no rub, nl s1 s2                           - JVD- none , edema- none, stasis changes- none, varices- none           Lung- clear to P&A, wheeze- none, cough- none , dullness-none, rub- none           Chest wall-  Abd- tender-no, distended-no, bowel sounds-present, HSM- no Br/ Gen/ Rectal- Not done, not indicated Extrem- cyanosis- none, clubbing, none, atrophy- none, strength- nl Neuro- articulate, weakness left hand by history, not examined

## 2014-05-21 NOTE — Patient Instructions (Signed)
Order- DME [facility]- CPAP setting 10.5, standing order for mask of choice, supplies, humidifier   [email or web address]   Might be an alternative source for supplies if needed  Please call as needed

## 2014-09-24 ENCOUNTER — Ambulatory Visit (INDEPENDENT_AMBULATORY_CARE_PROVIDER_SITE_OTHER): Payer: 59 | Admitting: Diagnostic Neuroimaging

## 2014-09-24 ENCOUNTER — Encounter: Payer: Self-pay | Admitting: Diagnostic Neuroimaging

## 2014-09-24 VITALS — BP 148/90 | HR 68 | Ht 62.0 in | Wt 189.4 lb

## 2014-09-24 DIAGNOSIS — G40109 Localization-related (focal) (partial) symptomatic epilepsy and epileptic syndromes with simple partial seizures, not intractable, without status epilepticus: Secondary | ICD-10-CM | POA: Diagnosis not present

## 2014-09-24 DIAGNOSIS — G060 Intracranial abscess and granuloma: Secondary | ICD-10-CM

## 2014-09-24 DIAGNOSIS — R208 Other disturbances of skin sensation: Secondary | ICD-10-CM

## 2014-09-24 DIAGNOSIS — R200 Anesthesia of skin: Secondary | ICD-10-CM

## 2014-09-24 DIAGNOSIS — G609 Hereditary and idiopathic neuropathy, unspecified: Secondary | ICD-10-CM

## 2014-09-24 NOTE — Progress Notes (Signed)
[facility]  PATIENT: [name] DOB: [birth year]  REFERRING CLINICIAN: Amedeo Gory HISTORY FROM: patient , EPIC chart review REASON FOR VISIT: new consult   HISTORICAL  CHIEF COMPLAINT:  Chief Complaint  Patient presents with  . New Evaluation    peripheral neuropathy    HISTORY OF PRESENT ILLNESS:   59 year old right-handed male here for evaluation of bilateral lower extremity numbness and tingling since October 2015.  May 2015 patient had onset of intermittent cramping in bilateral feet, instep, plantar regions, especially after doing specific physical activity such as yoga, weight lifting, martial arts. Right greater than left foot was affected. Toes and feet would spasm and curl up for 30 seconds at a time and then relieved. This happens sporadically a few times a week initially. Symptoms increased almost a daily basis. Patient modified his physical activity, began wearing shoes, increase fluid intake, and cramping sensation significant improvement. Around the same time patient was tested for various medical causes and was found to have elevated TSH levels. July 2015 he was started on Synthroid 50 g daily. This was increased to 75 g in October 2015. Around this time he noticed burning and pressure sensation in his feet, especially when he would wake up in the morning and stand on the ground next to his bed. Symptoms would last for a few minutes and then resolved. In December 2015 Synthroid was again increased to 88 g daily. Finally in February 2016 Synthroid was increased to 100 g daily on basis of persistent elevated TSH level. Around this time he noticed significant constant burning sensation in toes, feet, right hand, nose and face. Yesterday he did not take his Synthroid dose and today he feels 50% improvement in numbness and burning sensation.  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 2007 patient had onset of left thumb  twitching, left arm shaking, left face twitching. 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 This was monitored and treated with craniotomy 3 in 2007 and 2013. Abscess was thought to be related to septic emboli from colitis. Patient developed DVT and PE during 2007 hospital stay and due to presence of PFO in the past, this was closed.   Seizures have been stable on levetiracetam 500 mg twice a day. Last seizure in 2013. No generalized convulsive seizures.   REVIEW OF SYSTEMS: Full 14 system review of systems performed and notable only for snoring allergies.  ALLERGIES: Allergies  Allergen Reactions  . Iohexol Anaphylaxis and Shortness Of Breath     Code: HIVES, Desc: hives and tachycardia last time pt received IV CM kdean 07/19/06, Onset Date: [id number]   On 9/9 pt had a ct head w/ and received 13hr premedication. Was fine after scan, but broke out in hives when he got home.  Took 100mg  benedryl at home PO, and was fine.   . Codeine     agitation  . Dilantin [Phenytoin] Hives  . Heparin Hives  . Nuvigil [Armodafinil]     rash  . Other     Orting; Needs Kosher products  . Flagyl [Metronidazole] Rash    HOME MEDICATIONS: Outpatient Prescriptions Prior to Visit  Medication Sig Dispense Refill  . aspirin 81 MG tablet Take 81 mg by mouth daily.    . irbesartan (AVAPRO) 300 MG tablet Take 300 mg by mouth daily.    Marland Kitchen levETIRAcetam (KEPPRA) 500 MG tablet Take 500 mg by mouth 2 (two) times  daily.     . pantoprazole (PROTONIX) 40 MG tablet Take 40 mg by mouth daily.    Marland Kitchen albuterol (PROVENTIL HFA;VENTOLIN HFA) 108 (90 BASE) MCG/ACT inhaler Inhale 2 puffs into the lungs every 6 (six) hours as needed. For shortness of breath    . albuterol (PROVENTIL) (2.5 MG/3ML) 0.083% nebulizer solution as needed.    Marland Kitchen azelastine (ASTELIN) 0.1 % nasal spray as needed.     Marland Kitchen ketoconazole (NIZORAL) 2 % shampoo as needed.    . mometasone-formoterol (DULERA) 200-5  MCG/ACT AERO Inhale 2 puffs into the lungs 2 (two) times daily.    Marland Kitchen levothyroxine (SYNTHROID, LEVOTHROID) 75 MCG tablet Take 1 tablet by mouth daily.     No facility-administered medications prior to visit.    PAST MEDICAL HISTORY: Past Medical History  Diagnosis Date  . Arthritis   . DVT (deep venous thrombosis)   . Hx of pulmonary embolus     x 2  . Hypertension   . Asthma     excerise induced  . Sleep apnea     Sleep study done 5-6  years ago machine set at 10  . Patent foramen ovale   . History of transfusion of platelets   . Chronic renal insufficiency, stage III (moderate)   . Seizures   . Brain abscess   . Neuropathy   . History of osteomyelitis     PAST SURGICAL HISTORY: Past Surgical History  Procedure Laterality Date  . Patent foramen ovale closure    . Craniotomy    . Anterior cruciate ligament repair    . Deep vein    . Pulmonary embolus    . Laparoscopic appendectomy  11/20/2011  . Laparoscopic appendectomy  11/20/2011    Procedure: APPENDECTOMY LAPAROSCOPIC;  Surgeon: Harl Bowie, MD;  Location: [facility];  Service: General;  Laterality: N/A;  . Brain surgery      abscess drainage x 2  . Fracture surgery      right arm  . Carpal tunnel release    . Ulnar tunnel release    . Craniotomy  03/30/2012    Procedure: CRANIOTOMY TUMOR EXCISION;  Surgeon: Winfield Cunas, MD;  Location: [place] [facility];  Service: Neurosurgery;  Laterality: Right;   craniotomy for intracranial mass.  . Craniotomy  03/31/2012    Procedure: CRANIOTOMY TUMOR EXCISION;  Surgeon: Winfield Cunas, MD;  Location: [place] [facility];  Service: Neurosurgery;  Laterality: Right;  Craniotomy for excision of mass    FAMILY HISTORY: Family History  Problem Relation Age of Onset  . Allergies      whole family  . Asthma      whole family    SOCIAL HISTORY:  History   Social History  . Marital Status: Married    Spouse Name: N/A  . Number of Children: 5  . Years of Education: college    Occupational History  .     Social History Main Topics  . Smoking status: Never Smoker   . Smokeless tobacco: Never Used  . Alcohol Use: No     Comment: quit 10 years ago  . Drug Use: No  . Sexual Activity: No   Other Topics Concern  . Not on file   Social History Narrative     PHYSICAL EXAM  Filed Vitals:   09/24/14 0845  BP: 148/90  Pulse: 68  Height: 5\' 2"  (1.575 m)  Weight: 189 lb 6.4 oz (85.911 kg)    Body mass index is  34.63 kg/(m^2).   Visual Acuity Screening   Right eye Left eye Both eyes  Without correction:     With correction: 20/20 20/30     No flowsheet data found.  GENERAL EXAM: Patient is in no distress; well developed, nourished and groomed; neck is supple  CARDIOVASCULAR: Regular rate and rhythm, no murmurs, no carotid bruits  NEUROLOGIC: MENTAL STATUS: awake, alert, oriented to person, place and time, recent and remote memory intact, normal attention and concentration, language fluent, comprehension intact, naming intact, fund of knowledge appropriate; PRESSURE SPEECH CRANIAL NERVE: no papilledema on fundoscopic exam, pupils equal and reactive to light, visual fields full to confrontation, extraocular muscles intact, no nystagmus, facial sensation and strength symmetric, hearing intact, palate elevates symmetrically, uvula midline, shoulder shrug symmetric, tongue midline. MOTOR: normal bulk and tone, full strength in the RUE, BLE; LEFT HAND WEAKNESS (3) IN FINGER ABDUCTION, FINGER EXT, GRIP, WRIST EXTENSION. MILD EDEMA IN LEFT HAND SENSORY: DECR PP AND TEMP IN LEFT HAND AND LEFT FOOT; BILATERAL TOE VIB < 4 SEC.  COORDINATION: finger-nose-finger, fine finger movements normal REFLEXES: RUE 1, LUE 2, RIGHT KNEE 1, LEFT KNEE 2, RIGHT ANKLE 0, LEFT ANKLE 1; DOWN GOING TOES GAIT/STATION: narrow based gait; able to walk on toes, heels and tandem; romberg is negative     DIAGNOSTIC DATA (LABS, IMAGING, TESTING) - I reviewed patient records,  labs, notes, testing and imaging myself where available.  Lab Results  Component Value Date   WBC 7.5 03/30/2012   HGB 15.0 08/22/2012   HCT 44.0 08/22/2012   MCV 87.8 03/30/2012   PLT 215 03/30/2012      Component Value Date/Time   NA 139 08/22/2012 1727   K 3.9 08/22/2012 1727   CL 103 08/22/2012 1727   CO2 29 03/22/2012 0949   GLUCOSE 89 08/22/2012 1727   BUN 24* 08/22/2012 1727   CREATININE 1.50* 08/22/2012 1727   CALCIUM 9.9 03/22/2012 0949   PROT 6.7 03/01/2012 2135   ALBUMIN 3.9 03/01/2012 2135   AST 20 03/01/2012 2135   ALT 16 03/01/2012 2135   ALKPHOS 57 03/01/2012 2135   BILITOT 0.3 03/01/2012 2135   GFRNONAA 52* 03/22/2012 0949   GFRAA 61* 03/22/2012 0949   No results found for: CHOL, HDL, LDLCALC, LDLDIRECT, TRIG, CHOLHDL No results found for: HGBA1C No results found for: VITAMINB12 No results found for: TSH  I reviewed images myself and agree with interpretation. -VRP  08/23/12 MRI BRAIN 1.Satisfactory postoperative appearance of the brain status post resection of chronic suppurative granulomatous inflammation. Associated mild postoperative encephalomalacia and gliosis. Tiny residual triangular focus of enhancement. No mass effect or edema.  2. No new intracranial abnormality.     ASSESSMENT AND PLAN  59 y.o. 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 Workup reviewed. Could consider EMG/NCS, but symptoms improving with reduced synthroid dosing. Follow up with PCP re: synthroid and monitor neuropathy symptoms.  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    Penni Bombard, MD 5/95/6387, 5:64 AM Certified in Neurology, Neurophysiology and Neuroimaging  [facility] [phone]  [address] [place], [place] [address] [phone]

## 2014-09-24 NOTE — Patient Instructions (Signed)
Monitor symptoms. 

## 2015-05-22 ENCOUNTER — Encounter: Payer: Self-pay | Admitting: Internal Medicine

## 2015-05-22 ENCOUNTER — Ambulatory Visit (INDEPENDENT_AMBULATORY_CARE_PROVIDER_SITE_OTHER): Payer: 59 | Admitting: Internal Medicine

## 2015-05-22 VITALS — BP 118/80 | HR 84 | Ht 67.0 in | Wt 181.4 lb

## 2015-05-22 DIAGNOSIS — G4733 Obstructive sleep apnea (adult) (pediatric): Secondary | ICD-10-CM

## 2015-05-22 DIAGNOSIS — J45909 Unspecified asthma, uncomplicated: Secondary | ICD-10-CM | POA: Insufficient documentation

## 2015-05-22 DIAGNOSIS — J4540 Moderate persistent asthma, uncomplicated: Secondary | ICD-10-CM

## 2015-05-22 NOTE — Progress Notes (Signed)
01/25/14- [name] never smoker referred courtesy of Dr Willette Pa study over 59 years old. He was diagnosed with obstructive sleep apnea at the old office over 10 years ago and has been using CPAP ever since said on 10/[facility]. The current machine is several years old. He is told he is snoring through his mask. Bedtime 1 to 2 AM, short latency, waking  between 6:30 and 7 AM. No ENT surgery. He does not use the humidifier by choice. He observes that he sleeps poorly away from home and anticipates he would have trouble repeating a sleep study. There is no history of significant movement disturbance or other behavioral abnormality. 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 He had associated seizures and is left with chronic weakness left hand. Additional history of  Seasonal allergic rhinitis which has required prednisone, hypertension, blood clots. Chronic kidney disease stage III, GERD.  05/21/14- 57 yoM never smoker followed for OSA, complicated by hx brain abscess/craniotomy/seizure, seasonal allergic rhinitis, HBP, CKD, VTE, GERD NPSG 03/22/14 AHI 30.6/ hr, CPAP 9, weight 190 lbs Here to discuss sleep study results.  No changes from last OV.  05/22/15- 59 yoM never smoker followed for OSA, complicated by hx brain abscess/craniotomy/seizure, seasonal allergic rhinitis, asthmatic bronchitis HBP, CKD, VTE/PE, GERD CPAP 10.5/[facility] Follows For: Pt states he is doing well. pt states he just got over pnuemonia and bronchitis and is feeling much better now.  Pt using CPAP just about everynight for about 5 - 6 hours. Mask and pressure good for pt. No download available DME: [facility]  Plans flu vaccine soon with primary physician He admits he is better off with CPAP, sleeping better. Occasionally snores through. Had some response communication problems with [facility] but is willing to give him  another chance.  ROS-see HPI Constitutional:   No-   weight loss, night sweats, fevers, chills, fatigue, lassitude. HEENT:   No-  headaches, difficulty swallowing, tooth/dental problems, sore throat,       No-  sneezing, itching, ear ache, nasal congestion, post nasal drip,  CV:  No-   chest pain, orthopnea, PND, swelling in lower extremities, anasarca,                                               dizziness, palpitations Resp: No-   shortness of breath with exertion or at rest.              No-   productive cough,  No non-productive cough,  No- coughing up of blood.              No-   change in color of mucus.  No- wheezing.   Skin: No-   rash or lesions. GI:  No-   heartburn, indigestion, abdominal pain, nausea, vomiting,  GU:  MS:  + joint pain or swelling.  Neuro-    + Chronic neuropathic weakness left hand Psych:  No- change in mood or affect. No depression or anxiety.  No memory loss.  OBJ- Physical Exam General- Alert, Oriented, Affect-appropriate, Distress- none acute, + full beard Skin- rash-none, lesions- none, excoriation- none Lymphadenopathy- none Head- atraumatic            Eyes- Gross vision intact, PERRLA, conjunctivae and secretions clear  Ears- Hearing, canals-normal            Nose- sniffing +, Clear, no-Septal dev, mucus, polyps, erosion, perforation             Throat- Mallampati III , mucosa clear , drainage- none, tonsils- atrophic Neck- flexible , trachea midline, no stridor , thyroid nl, carotid no bruit Chest - symmetrical excursion , unlabored           Heart/CV- RRR , no murmur , no gallop  , no rub, nl s1 s2                           - JVD- none , edema- none, stasis changes- none, varices- none           Lung- clear to P&A, wheeze- none, cough- none , dullness-none, rub- none           Chest wall-  Abd-  Br/ Gen/ Rectal- Not done, not indicated Extrem- cyanosis- none, clubbing, none, atrophy- none, strength- nl, + brace left wrist Neuro-  articulate, weakness left hand by history, not examined

## 2015-05-22 NOTE — Patient Instructions (Signed)
We can continue CPAP pressure 10.5/[facility]. He continued to have problems with your DME company let us know and we will try to step in and help.  Order DME [facility]-continuation order CPAP 10.5, mask of choice, humidifier, supplies, [employer] for documentation   DX OSA

## 2015-05-26 NOTE — Assessment & Plan Note (Signed)
Recent pneumonia treated elsewhere with good response. He now feels well and chest exam is unremarkable.

## 2015-05-26 NOTE — Assessment & Plan Note (Signed)
Generally good compliance and control with CPAP. He feels better using it. We discussed goals and comfort measures. He wants to continue working with [facility] but understands we can help change DME companies if necessary.

## 2016-05-25 ENCOUNTER — Encounter: Payer: Self-pay | Admitting: Internal Medicine

## 2016-05-25 ENCOUNTER — Ambulatory Visit (INDEPENDENT_AMBULATORY_CARE_PROVIDER_SITE_OTHER): Payer: 59 | Admitting: Internal Medicine

## 2016-05-25 DIAGNOSIS — J3089 Other allergic rhinitis: Secondary | ICD-10-CM | POA: Diagnosis not present

## 2016-05-25 DIAGNOSIS — J4520 Mild intermittent asthma, uncomplicated: Secondary | ICD-10-CM

## 2016-05-25 DIAGNOSIS — G4733 Obstructive sleep apnea (adult) (pediatric): Secondary | ICD-10-CM | POA: Diagnosis not present

## 2016-05-25 DIAGNOSIS — J302 Other seasonal allergic rhinitis: Secondary | ICD-10-CM

## 2016-05-25 NOTE — Assessment & Plan Note (Signed)
Moderate intermittent, mostly viral triggered. Today uncomplicated and feels well.

## 2016-05-25 NOTE — Progress Notes (Signed)
01/25/14- 37 yoM never smoker referred courtesy of Dr Willette Pa study over 60 years old. He was diagnosed with obstructive sleep apnea at the old office over 10 years ago and has been using CPAP ever since said on 10/[facility]. The current machine is several years old. He is told he is snoring through his mask. Bedtime 1 to 2 AM, short latency, waking  between 6:30 and 7 AM. No ENT surgery. He does not use the humidifier by choice. He observes that he sleeps poorly away from home and anticipates he would have trouble repeating a sleep study. There is no history of significant movement disturbance or other behavioral abnormality. 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 He had associated seizures and is left with chronic weakness left hand. Additional history of  Seasonal allergic rhinitis which has required prednisone, hypertension, blood clots. Chronic kidney disease stage III, GERD.  05/21/14- 57 yoM never smoker followed for OSA, complicated by hx brain abscess/craniotomy/seizure, seasonal allergic rhinitis, HBP, CKD, VTE, GERD NPSG 03/22/14 AHI 30.6/ hr, CPAP 9, weight 190 lbs Here to discuss sleep study results.  No changes from last OV.  05/22/15- 59 yoM never smoker followed for OSA, complicated by hx brain abscess/craniotomy/seizure, seasonal allergic rhinitis, asthmatic bronchitis HBP, CKD, VTE/PE, GERD CPAP 10.5/[facility] Follows For: Pt states he is doing well. pt states he just got over pnuemonia and bronchitis and is feeling much better now.  Pt using CPAP just about everynight for about 5 - 6 hours. Mask and pressure good for pt. No download available DME: [facility]  Plans flu vaccine soon with primary physician He admits he is better off with CPAP, sleeping better. Occasionally snores through. Had some response communication problems with [facility] but is willing to give him  another chance.  05/25/2016-60 year old male never smoker followed for OSA, complicated by history brain abscess/craniotomy/seizure, seasonal allergic rhinitis, asthmatic bronchitis, HBP, CK D, CDE/PE, GERD CPAP 10.5/[facility] FOLLOWS FOR: DME: [facility]. Pt is not wearing CPAP every night-has been sick and then sometimes falls asleep before putting CPAP machine on.  We are unable to access his card for download this morning and are requesting download from his DME company. He is convinced he is better off with CPAP and wife tells him he snores without it. Needed prednisone for a viral bronchitis last month. Stays on Allegra and Astelin nasal spray for perennial rhinitis.   ROS-see HPI                 + = pos Constitutional:   No-   weight loss, night sweats, fevers, chills, fatigue, lassitude. HEENT:   No-  headaches, difficulty swallowing, tooth/dental problems, sore throat,       No-  sneezing, itching, ear ache, + nasal congestion, post nasal drip,  CV:  No-   chest pain, orthopnea, PND, swelling in lower extremities, anasarca,                                               dizziness, palpitations Resp: No-   shortness of breath with exertion or at rest.              No-   productive cough,  No non-productive cough,  No- coughing up of blood.  No-   change in color of mucus.  No- wheezing.   Skin: No-   rash or lesions. GI:  No-   heartburn, indigestion, abdominal pain, nausea, vomiting,  GU:  MS:  + joint pain or swelling.  Neuro-    + Chronic neuropathic weakness left hand Psych:  No- change in mood or affect. No depression or anxiety.  No memory loss.  OBJ- Physical Exam General- Alert, Oriented, Affect-appropriate, Distress- none acute, + beard Skin- rash-none, lesions- none, excoriation- none Lymphadenopathy- none Head- atraumatic            Eyes- Gross vision intact, PERRLA, conjunctivae and secretions clear            Ears- Hearing, canals-normal            Nose-   +  turbinate edema, no-Septal dev, mucus, polyps, erosion, perforation             Throat- Mallampati III , mucosa clear , drainage- none, tonsils- atrophic Neck- flexible , trachea midline, no stridor , thyroid nl, carotid no bruit Chest - symmetrical excursion , unlabored           Heart/CV- RRR , no murmur , no gallop  , no rub, nl s1 s2                           - JVD- none , edema- none, stasis changes- none, varices- none           Lung- clear to P&A, wheeze- none, cough- none , dullness-none, rub- none           Chest wall-  Abd-  Br/ Gen/ Rectal- Not done, not indicated Extrem- cyanosis- none, clubbing, none, atrophy- none, strength- nl, + brace left wrist Neuro- articulate, weakness left hand by history, not examined

## 2016-05-25 NOTE — Assessment & Plan Note (Signed)
Nasal stuffiness probably contributes to his sleep apnea problem but he is comfortable wearing CPAP. He reports using daily as trial in and Allegra-consider possible overdrying.

## 2016-05-25 NOTE — Addendum Note (Signed)
Addended by: Clayborne Dana C on: 05/25/2016 10:01 AM   Modules accepted: Orders

## 2016-05-25 NOTE — Patient Instructions (Addendum)
We can continue DME [facility] CPAP 10.5, mask of choice, humidifier, supplies,   Order- DME [facility]- please download CPAP for pressure compliance   Dx OSA  Please call as needed

## 2016-05-25 NOTE — Assessment & Plan Note (Signed)
He describes generally good compliance and control saying he is comfortable at pressure 10.5. Recently got new supplies. Interested in newer smaller machine when eligible. Plan-we are asking his DME company for download for documentation.

## 2016-07-28 DIAGNOSIS — M62838 Other muscle spasm: Secondary | ICD-10-CM | POA: Diagnosis not present

## 2016-07-28 DIAGNOSIS — M6281 Muscle weakness (generalized): Secondary | ICD-10-CM | POA: Diagnosis not present

## 2016-09-24 DIAGNOSIS — D1800 Hemangioma unspecified site: Secondary | ICD-10-CM | POA: Diagnosis not present

## 2016-09-24 DIAGNOSIS — L814 Other melanin hyperpigmentation: Secondary | ICD-10-CM | POA: Diagnosis not present

## 2016-09-24 DIAGNOSIS — N6320 Unspecified lump in the left breast, unspecified quadrant: Secondary | ICD-10-CM | POA: Diagnosis not present

## 2016-09-24 DIAGNOSIS — L821 Other seborrheic keratosis: Secondary | ICD-10-CM | POA: Diagnosis not present

## 2016-09-25 ENCOUNTER — Other Ambulatory Visit: Payer: Self-pay | Admitting: Physician Assistant

## 2016-09-25 DIAGNOSIS — N6321 Unspecified lump in the left breast, upper outer quadrant: Secondary | ICD-10-CM

## 2016-09-28 ENCOUNTER — Ambulatory Visit
Admission: RE | Admit: 2016-09-28 | Discharge: 2016-09-28 | Disposition: A | Discharge status: Home / Self Care | Payer: 59 | Source: Ambulatory Visit | Attending: Physician Assistant | Admitting: Physician Assistant

## 2016-09-28 DIAGNOSIS — R928 Other abnormal and inconclusive findings on diagnostic imaging of breast: Secondary | ICD-10-CM | POA: Diagnosis not present

## 2016-09-28 DIAGNOSIS — N6321 Unspecified lump in the left breast, upper outer quadrant: Secondary | ICD-10-CM

## 2016-09-28 DIAGNOSIS — N6489 Other specified disorders of breast: Secondary | ICD-10-CM | POA: Diagnosis not present

## 2016-11-16 DIAGNOSIS — S60031A Contusion of right middle finger without damage to nail, initial encounter: Secondary | ICD-10-CM | POA: Diagnosis not present

## 2016-12-18 DIAGNOSIS — E782 Mixed hyperlipidemia: Secondary | ICD-10-CM | POA: Diagnosis not present

## 2016-12-18 DIAGNOSIS — Z125 Encounter for screening for malignant neoplasm of prostate: Secondary | ICD-10-CM | POA: Diagnosis not present

## 2016-12-18 DIAGNOSIS — Z0000 Encounter for general adult medical examination without abnormal findings: Secondary | ICD-10-CM | POA: Diagnosis not present

## 2016-12-18 DIAGNOSIS — E039 Hypothyroidism, unspecified: Secondary | ICD-10-CM | POA: Diagnosis not present

## 2016-12-18 DIAGNOSIS — R809 Proteinuria, unspecified: Secondary | ICD-10-CM | POA: Diagnosis not present

## 2016-12-18 DIAGNOSIS — I10 Essential (primary) hypertension: Secondary | ICD-10-CM | POA: Diagnosis not present

## 2017-01-27 DIAGNOSIS — G4733 Obstructive sleep apnea (adult) (pediatric): Secondary | ICD-10-CM | POA: Diagnosis not present

## 2017-02-08 DIAGNOSIS — R0981 Nasal congestion: Secondary | ICD-10-CM | POA: Diagnosis not present

## 2017-03-09 DIAGNOSIS — M9902 Segmental and somatic dysfunction of thoracic region: Secondary | ICD-10-CM | POA: Diagnosis not present

## 2017-03-09 DIAGNOSIS — M9903 Segmental and somatic dysfunction of lumbar region: Secondary | ICD-10-CM | POA: Diagnosis not present

## 2017-03-09 DIAGNOSIS — M9901 Segmental and somatic dysfunction of cervical region: Secondary | ICD-10-CM | POA: Diagnosis not present

## 2017-04-14 DIAGNOSIS — M1711 Unilateral primary osteoarthritis, right knee: Secondary | ICD-10-CM | POA: Diagnosis not present

## 2017-06-15 DIAGNOSIS — M9903 Segmental and somatic dysfunction of lumbar region: Secondary | ICD-10-CM | POA: Diagnosis not present

## 2017-06-15 DIAGNOSIS — M9902 Segmental and somatic dysfunction of thoracic region: Secondary | ICD-10-CM | POA: Diagnosis not present

## 2017-06-15 DIAGNOSIS — M9901 Segmental and somatic dysfunction of cervical region: Secondary | ICD-10-CM | POA: Diagnosis not present

## 2017-06-23 DIAGNOSIS — N183 Chronic kidney disease, stage 3 (moderate): Secondary | ICD-10-CM | POA: Diagnosis not present

## 2017-06-23 DIAGNOSIS — Z23 Encounter for immunization: Secondary | ICD-10-CM | POA: Diagnosis not present

## 2017-06-23 DIAGNOSIS — R972 Elevated prostate specific antigen [PSA]: Secondary | ICD-10-CM | POA: Diagnosis not present

## 2017-06-23 DIAGNOSIS — I10 Essential (primary) hypertension: Secondary | ICD-10-CM | POA: Diagnosis not present

## 2017-07-07 DIAGNOSIS — G4733 Obstructive sleep apnea (adult) (pediatric): Secondary | ICD-10-CM | POA: Diagnosis not present

## 2017-08-19 DIAGNOSIS — I10 Essential (primary) hypertension: Secondary | ICD-10-CM | POA: Diagnosis not present

## 2017-08-19 DIAGNOSIS — E039 Hypothyroidism, unspecified: Secondary | ICD-10-CM | POA: Diagnosis not present

## 2017-09-28 DIAGNOSIS — L814 Other melanin hyperpigmentation: Secondary | ICD-10-CM | POA: Diagnosis not present

## 2017-09-28 DIAGNOSIS — D1800 Hemangioma unspecified site: Secondary | ICD-10-CM | POA: Diagnosis not present

## 2017-09-28 DIAGNOSIS — L821 Other seborrheic keratosis: Secondary | ICD-10-CM | POA: Diagnosis not present

## 2017-12-21 ENCOUNTER — Encounter (HOSPITAL_COMMUNITY): Payer: Self-pay

## 2017-12-21 ENCOUNTER — Emergency Department (HOSPITAL_COMMUNITY)
Admission: EM | Admit: 2017-12-21 | Discharge: 2017-12-22 | Disposition: A | Discharge status: Home / Self Care | Payer: 59 | Attending: Emergency Medicine | Admitting: Emergency Medicine

## 2017-12-21 ENCOUNTER — Other Ambulatory Visit: Payer: Self-pay

## 2017-12-21 ENCOUNTER — Emergency Department (HOSPITAL_COMMUNITY): Payer: 59

## 2017-12-21 ENCOUNTER — Encounter (HOSPITAL_COMMUNITY): Payer: Self-pay | Admitting: Emergency Medicine

## 2017-12-21 ENCOUNTER — Emergency Department (HOSPITAL_COMMUNITY)
Admission: EM | Admit: 2017-12-21 | Discharge: 2017-12-21 | Discharge status: Against Medical Advice | Payer: 59 | Attending: Emergency Medicine | Admitting: Emergency Medicine

## 2017-12-21 DIAGNOSIS — N183 Chronic kidney disease, stage 3 (moderate): Secondary | ICD-10-CM | POA: Diagnosis not present

## 2017-12-21 DIAGNOSIS — Z5321 Procedure and treatment not carried out due to patient leaving prior to being seen by health care provider: Secondary | ICD-10-CM | POA: Diagnosis not present

## 2017-12-21 DIAGNOSIS — Y9239 Other specified sports and athletic area as the place of occurrence of the external cause: Secondary | ICD-10-CM | POA: Insufficient documentation

## 2017-12-21 DIAGNOSIS — Y93B9 Activity, other involving muscle strengthening exercises: Secondary | ICD-10-CM | POA: Insufficient documentation

## 2017-12-21 DIAGNOSIS — Y929 Unspecified place or not applicable: Secondary | ICD-10-CM | POA: Insufficient documentation

## 2017-12-21 DIAGNOSIS — S199XXA Unspecified injury of neck, initial encounter: Secondary | ICD-10-CM | POA: Diagnosis not present

## 2017-12-21 DIAGNOSIS — Z79899 Other long term (current) drug therapy: Secondary | ICD-10-CM | POA: Insufficient documentation

## 2017-12-21 DIAGNOSIS — Y999 Unspecified external cause status: Secondary | ICD-10-CM | POA: Diagnosis not present

## 2017-12-21 DIAGNOSIS — Y9379 Activity, other specified sports and athletics: Secondary | ICD-10-CM | POA: Insufficient documentation

## 2017-12-21 DIAGNOSIS — Y998 Other external cause status: Secondary | ICD-10-CM | POA: Insufficient documentation

## 2017-12-21 DIAGNOSIS — S12201A Unspecified nondisplaced fracture of third cervical vertebra, initial encounter for closed fracture: Secondary | ICD-10-CM | POA: Diagnosis not present

## 2017-12-21 DIAGNOSIS — I129 Hypertensive chronic kidney disease with stage 1 through stage 4 chronic kidney disease, or unspecified chronic kidney disease: Secondary | ICD-10-CM | POA: Diagnosis not present

## 2017-12-21 DIAGNOSIS — W1789XA Other fall from one level to another, initial encounter: Secondary | ICD-10-CM | POA: Diagnosis not present

## 2017-12-21 DIAGNOSIS — J45909 Unspecified asthma, uncomplicated: Secondary | ICD-10-CM | POA: Diagnosis not present

## 2017-12-21 DIAGNOSIS — W08XXXA Fall from other furniture, initial encounter: Secondary | ICD-10-CM | POA: Insufficient documentation

## 2017-12-21 DIAGNOSIS — S0990XA Unspecified injury of head, initial encounter: Secondary | ICD-10-CM | POA: Diagnosis not present

## 2017-12-21 DIAGNOSIS — Z86011 Personal history of benign neoplasm of the brain: Secondary | ICD-10-CM | POA: Diagnosis not present

## 2017-12-21 NOTE — ED Notes (Signed)
ED Provider at bedside. 

## 2017-12-21 NOTE — ED Triage Notes (Addendum)
Pt reports he was laying on bench when he fell off on to his head onto concrete.  Pt stated he heard a crunch.  Not on blood thinner.  He reports prior "brain surgeries."  Pt wearing neck brace given to him by [place]

## 2017-12-21 NOTE — ED Triage Notes (Signed)
Pt states he was at gym on a bench lifting- approx 18-20" high, when he fell forward on to concrete. Pt states he heard his neck "crack" approx 1 hour ago (1800)

## 2017-12-21 NOTE — ED Provider Notes (Signed)
[facility] EMERGENCY DEPARTMENT Provider Note   CSN: [id number] Arrival date & time: 12/21/17  2011     History   Chief Complaint Chief Complaint  Patient presents with  . Fall  . Neck Pain    HPI [name] is a 62 y.o. male.  Patient is a 62 year old male with past medical history of prior surgery for brain abscess, hypertension, seizures.  He presents today for evaluation of a fall.  He states he was working out this evening when he slipped off of a weight bench and landed on his head.  He states he felt a "crunch" in his neck.  He denies any numbness or tingling.  He denies any other injury.  The history is provided by the patient.  Fall  This is a new problem. The current episode started 1 to 2 hours ago. The problem occurs constantly. The problem has not changed since onset.Pertinent negatives include no shortness of breath. Nothing aggravates the symptoms. Nothing relieves the symptoms. He has tried nothing for the symptoms.    Past Medical History:  Diagnosis Date  . Arthritis   . Asthma    excerise induced  . Brain abscess   . Chronic renal insufficiency, stage III (moderate) (HCC)   . DVT (deep venous thrombosis) ([place])   . History of osteomyelitis   . History of transfusion of platelets   . Hx of pulmonary embolus    x 2  . Hypertension   . Neuropathy   . Patent foramen ovale   . Seizures ([place])   . Sleep apnea    Sleep study done 5-6  years ago machine set at 10    Patient Active Problem List   Diagnosis Date Noted  . Seasonal and perennial allergic rhinitis 05/25/2016  . Asthmatic bronchitis 05/22/2015  . GERD (gastroesophageal reflux disease) 01/28/2014  . Other pulmonary embolism and infarction 01/28/2014  . Cerebral mass 04/01/2012  . S/P appendectomy 12/08/2011  . Obstructive sleep apnea 01/28/2007  . HYPERTENSION, BENIGN ESSENTIAL 01/28/2007  . KIDNEY DISEASE, CHRONIC, STAGE III 01/28/2007    Past Surgical History:    Procedure Laterality Date  . ANTERIOR CRUCIATE LIGAMENT REPAIR    . BRAIN SURGERY     abscess drainage x 2  . CARPAL TUNNEL RELEASE    . CRANIOTOMY    . CRANIOTOMY  03/30/2012   Procedure: CRANIOTOMY TUMOR EXCISION;  Surgeon: Winfield Cunas, MD;  Location: [facility];  Service: Neurosurgery;  Laterality: Right;   craniotomy for intracranial mass.  . CRANIOTOMY  03/31/2012   Procedure: CRANIOTOMY TUMOR EXCISION;  Surgeon: Winfield Cunas, MD;  Location: [facility];  Service: Neurosurgery;  Laterality: Right;  Craniotomy for excision of mass  . Deep Vein    . FRACTURE SURGERY     right arm  . LAPAROSCOPIC APPENDECTOMY  11/20/2011  . LAPAROSCOPIC APPENDECTOMY  11/20/2011   Procedure: APPENDECTOMY LAPAROSCOPIC;  Surgeon: Harl Bowie, MD;  Location: [facility];  Service: General;  Laterality: N/A;  . PATENT FORAMEN OVALE CLOSURE    . Pulmonary Embolus    . ULNAR TUNNEL RELEASE          Home Medications    Prior to Admission medications   Medication Sig Start Date End Date Taking? Authorizing Provider  albuterol (PROVENTIL HFA;VENTOLIN HFA) 108 (90 BASE) MCG/ACT inhaler Inhale 2 puffs into the lungs every 6 (six) hours as needed. For shortness of breath    [provider]  albuterol (PROVENTIL) (2.5 MG/3ML) 0.083% nebulizer solution as needed.    [provider]  azelastine (ASTELIN) 0.1 % nasal spray as needed.  11/27/13   [provider]  fexofenadine (ALLEGRA) 180 MG tablet Take 180 mg by mouth daily.    [provider]  hydrochlorothiazide (MICROZIDE) 12.5 MG capsule Take 1 capsule by mouth daily. 05/20/15   [provider]  ketoconazole (NIZORAL) 2 % shampoo as needed.    [provider]  levETIRAcetam (KEPPRA) 500 MG tablet Take 500 mg by mouth 2 (two) times daily.     [provider]  mometasone-formoterol (DULERA) 200-5 MCG/ACT AERO Inhale 2 puffs into the lungs 2 (two) times daily.    [provider]   pantoprazole (PROTONIX) 40 MG tablet Take 40 mg by mouth daily.    [provider]    Family History Family History  Problem Relation Age of Onset  . Allergies Unknown        whole family  . Asthma Unknown        whole family    Social History Social History   Tobacco Use  . Smoking status: Never Smoker  . Smokeless tobacco: Never Used  Substance Use Topics  . Alcohol use: No    Comment: quit 10 years ago  . Drug use: No     Allergies   Iohexol; Codeine; Dilantin [phenytoin]; Heparin; Nuvigil [armodafinil]; Other; and Flagyl [metronidazole]   Review of Systems Review of Systems  Respiratory: Negative for shortness of breath.   All other systems reviewed and are negative.    Physical Exam Updated Vital Signs BP (!) 138/110 (BP Location: Right Arm)   Pulse 94   Temp 98.3 F (36.8 C) (Oral)   Resp 18   Ht 5\' 7"  (1.702 m)   Wt 83.5 kg (184 lb)   SpO2 96%   BMI 28.82 kg/m   Physical Exam  Constitutional: He is oriented to person, place, and time. He appears well-developed and well-nourished. No distress.  HENT:  Head: Normocephalic and atraumatic.  Mouth/Throat: Oropharynx is clear and moist.  Neck: Normal range of motion. Neck supple.  Neck is immobilized in a cervical collar.  There is tenderness to palpation in the soft tissues of the mid cervical region.  There is no bony tenderness or step-off.  Cardiovascular: Normal rate and regular rhythm. Exam reveals no friction rub.  No murmur heard. Pulmonary/Chest: Effort normal and breath sounds normal. No respiratory distress. He has no wheezes. He has no rales.  Abdominal: Soft. Bowel sounds are normal. He exhibits no distension. There is no tenderness.  Musculoskeletal: Normal range of motion. He exhibits no edema.  Neurological: He is alert and oriented to person, place, and time. Coordination normal.  Skin: Skin is warm and dry. He is not diaphoretic.  Nursing note and vitals reviewed.    ED  Treatments / Results  Labs (all labs ordered are listed, but only abnormal results are displayed) Labs Reviewed - No data to display  EKG None  Radiology Ct Head Wo Contrast  Result Date: 12/21/2017 CLINICAL DATA:  Patient was lifting at the gym when he fell forward onto concrete. Patient heard a crack coming from his neck. History of chronic right frontal lobe lesion with craniotomy for suppurative granulomatous inflammation. EXAM: CT HEAD WITHOUT CONTRAST CT CERVICAL SPINE WITHOUT CONTRAST TECHNIQUE: Multidetector CT imaging of the head and cervical spine was performed following the standard protocol without intravenous contrast. Multiplanar CT image reconstructions of the  cervical spine were also generated. COMPARISON:  03/31/2012 CT, MRI 08/22/2012, CT neck 05/17/2014 FINDINGS: CT HEAD FINDINGS Brain: Postop appearance of the brain with encephalomalacia involving the previously noted area of enhancement involving the right posterosuperior frontal gyrus. No acute large vascular territory infarct, hemorrhage, midline shift or edema. No intra-axial mass nor extra-axial fluid collections. No ventriculomegaly. Vascular: No hyperdense vessel sign Skull: Stigmata of previous right frontal craniotomy. No acute skull fracture or suspicious osseous lesions. Sinuses/Orbits: No acute finding. Other: None CT CERVICAL SPINE FINDINGS Alignment: Reversal cervical lordosis is noted. Intact atlantodental interval. Skull base and vertebrae: There is a new fracture involving the anterior inferior corner of the C3 vertebral body with prevertebral soft tissue swelling. This could be due to a hyperextension injury given mechanism of fall. Slight widening of the interspinous process between C3 and C4 is noted but this appears chronic and stable. Soft tissues and spinal canal: Prevertebral soft tissue swelling and anterior to C3. No visible canal hematoma. Disc levels: Moderate to marked disc space narrowing from C3 through  C7. Mild disc bulges at C3-4, C5-6 and C6-7 with small central disc herniation at C4-5. No jumped or perched appearing facets. Upper chest: No pulmonary consolidation or mass. Other: Negative IMPRESSION: 1. Stable postop appearance of the brain with encephalomalacia in previous area of enhancement involving the right posterosuperior frontal gyrus. 2. No acute intracranial abnormality noted. 3. Small anterior inferior corner fracture of the C3 vertebral body with associated prevertebral soft tissue swelling. Given mechanism of fall, findings likely represent a hyperextension teardrop fracture of C3. MRI may help for further assessment of ligamentous injuries. These results were called by telephone at the time of interpretation on 12/21/2017 at 9:31 pm to [place] , who verbally acknowledged these results. 4. Cervical spondylosis from C3 through C7 with small central disc bulge at C4-5 noted. Electronically Signed   By: Ashley Royalty M.D.   On: 12/21/2017 21:31   Ct Cervical Spine Wo Contrast  Result Date: 12/21/2017 CLINICAL DATA:  Patient was lifting at the gym when he fell forward onto concrete. Patient heard a crack coming from his neck. History of chronic right frontal lobe lesion with craniotomy for suppurative granulomatous inflammation. EXAM: CT HEAD WITHOUT CONTRAST CT CERVICAL SPINE WITHOUT CONTRAST TECHNIQUE: Multidetector CT imaging of the head and cervical spine was performed following the standard protocol without intravenous contrast. Multiplanar CT image reconstructions of the cervical spine were also generated. COMPARISON:  03/31/2012 CT, MRI 08/22/2012, CT neck 05/17/2014 FINDINGS: CT HEAD FINDINGS Brain: Postop appearance of the brain with encephalomalacia involving the previously noted area of enhancement involving the right posterosuperior frontal gyrus. No acute large vascular territory infarct, hemorrhage, midline shift or edema. No intra-axial mass nor extra-axial fluid  collections. No ventriculomegaly. Vascular: No hyperdense vessel sign Skull: Stigmata of previous right frontal craniotomy. No acute skull fracture or suspicious osseous lesions. Sinuses/Orbits: No acute finding. Other: None CT CERVICAL SPINE FINDINGS Alignment: Reversal cervical lordosis is noted. Intact atlantodental interval. Skull base and vertebrae: There is a new fracture involving the anterior inferior corner of the C3 vertebral body with prevertebral soft tissue swelling. This could be due to a hyperextension injury given mechanism of fall. Slight widening of the interspinous process between C3 and C4 is noted but this appears chronic and stable. Soft tissues and spinal canal: Prevertebral soft tissue swelling and anterior to C3. No visible canal hematoma. Disc levels: Moderate to marked disc space narrowing from C3 through C7. Mild disc bulges  at C3-4, C5-6 and C6-7 with small central disc herniation at C4-5. No jumped or perched appearing facets. Upper chest: No pulmonary consolidation or mass. Other: Negative IMPRESSION: 1. Stable postop appearance of the brain with encephalomalacia in previous area of enhancement involving the right posterosuperior frontal gyrus. 2. No acute intracranial abnormality noted. 3. Small anterior inferior corner fracture of the C3 vertebral body with associated prevertebral soft tissue swelling. Given mechanism of fall, findings likely represent a hyperextension teardrop fracture of C3. MRI may help for further assessment of ligamentous injuries. These results were called by telephone at the time of interpretation on 12/21/2017 at 9:31 pm to [place] , who verbally acknowledged these results. 4. Cervical spondylosis from C3 through C7 with small central disc bulge at C4-5 noted. Electronically Signed   By: Ashley Royalty M.D.   On: 12/21/2017 21:31    Procedures Procedures (including critical care time)  Medications Ordered in ED Medications - No data to  display   Initial Impression / Assessment and Plan / ED Course  I have reviewed the triage vital signs and the nursing notes.  Pertinent labs & imaging results that were available during my care of the patient were reviewed by me and considered in my medical decision making (see chart for details).  CT scan shows what appears to be a C3 teardrop fracture with some prevertebral swelling.  This finding was discussed with Dr. Annette Stable from neurosurgery.  He is recommending an Aspen collar and for the patient to follow-up with Dr. Christella Noa with whom the patient has a relationship from prior neurosurgical issues.  He will be given pain medication and is to follow-up in the next several days.  Final Clinical Impressions(s) / ED Diagnoses   Final diagnoses:  None    ED Discharge Orders    None       Veryl Speak, MD 12/22/17 0008

## 2017-12-21 NOTE — ED Provider Notes (Signed)
Patient placed in Quick Look pathway, seen and evaluated   Chief Complaint: Fall  HPI:   Patient was on a bench when he fell off  And struck his head on the concrete floor.  He is not on a blood thinner.  He reports hearing his head and neck crack.   ROS: No numbness or tingling (one)  Physical Exam:   Gen: No distress  Neuro: Awake and Alert  Skin: Warm    Focused Exam: Patient is A and O x3.  C-collar in place   Initiation of care has begun. The patient has been counseled on the process, plan, and necessity for staying for the completion/evaluation, and the remainder of the medical screening examination    Ollen Gross 12/21/17 2032    Malvin Johns, MD 12/21/17 2036

## 2017-12-22 MED ORDER — HYDROCODONE-ACETAMINOPHEN 5-325 MG PO TABS: 1.0000 | ORAL_TABLET | Freq: Four times a day (QID) | ORAL | 0 refills | Status: DC | PRN

## 2017-12-22 NOTE — Discharge Instructions (Addendum)
Hydrocodone is prescribed as needed for pain not relieved with meloxicam.  Wear cervical collar at all times except showering.  Call Dr. Christella Noa tomorrow to arrange a prompt follow-up appointment.

## 2017-12-27 DIAGNOSIS — I10 Essential (primary) hypertension: Secondary | ICD-10-CM | POA: Diagnosis not present

## 2017-12-27 DIAGNOSIS — M4722 Other spondylosis with radiculopathy, cervical region: Secondary | ICD-10-CM | POA: Diagnosis not present

## 2017-12-27 DIAGNOSIS — S12390A Other displaced fracture of fourth cervical vertebra, initial encounter for closed fracture: Secondary | ICD-10-CM | POA: Diagnosis not present

## 2018-01-31 DIAGNOSIS — E039 Hypothyroidism, unspecified: Secondary | ICD-10-CM | POA: Diagnosis not present

## 2018-01-31 DIAGNOSIS — R972 Elevated prostate specific antigen [PSA]: Secondary | ICD-10-CM | POA: Diagnosis not present

## 2018-05-09 DIAGNOSIS — M1711 Unilateral primary osteoarthritis, right knee: Secondary | ICD-10-CM | POA: Diagnosis not present

## 2018-05-12 DIAGNOSIS — Z23 Encounter for immunization: Secondary | ICD-10-CM | POA: Diagnosis not present

## 2018-05-15 DIAGNOSIS — R6889 Other general symptoms and signs: Secondary | ICD-10-CM | POA: Diagnosis not present

## 2018-05-25 DIAGNOSIS — J209 Acute bronchitis, unspecified: Secondary | ICD-10-CM | POA: Diagnosis not present

## 2018-06-15 DIAGNOSIS — R05 Cough: Secondary | ICD-10-CM | POA: Diagnosis not present

## 2018-06-28 DIAGNOSIS — N183 Chronic kidney disease, stage 3 (moderate): Secondary | ICD-10-CM | POA: Diagnosis not present

## 2018-06-28 DIAGNOSIS — I10 Essential (primary) hypertension: Secondary | ICD-10-CM | POA: Diagnosis not present

## 2018-06-28 DIAGNOSIS — E782 Mixed hyperlipidemia: Secondary | ICD-10-CM | POA: Diagnosis not present

## 2018-06-30 ENCOUNTER — Ambulatory Visit (INDEPENDENT_AMBULATORY_CARE_PROVIDER_SITE_OTHER): Payer: 59 | Admitting: Nurse Practitioner

## 2018-06-30 ENCOUNTER — Encounter: Payer: Self-pay | Admitting: Nurse Practitioner

## 2018-06-30 VITALS — BP 140/98 | HR 72 | Ht 66.75 in | Wt 187.0 lb

## 2018-06-30 DIAGNOSIS — G4733 Obstructive sleep apnea (adult) (pediatric): Secondary | ICD-10-CM | POA: Diagnosis not present

## 2018-06-30 NOTE — Progress Notes (Signed)
@Patient  ID: [name], adult    DOB: October 25, 1955, 62 y.o.   MRN: [id number]  Chief Complaint  Patient presents with  . Follow-up    CPAP    Referring provider: Cari Caraway, MD  HPI 62 year old male never smoker with OSA followed by Dr. Annamaria Boots PMH also includes asthma and seasonal allergies that are followed by his PCP  Tests: NPSG 03/22/14 AHI 30.6/ hr, CPAP 9, weight 190 lbs  OV 06/30/18 - follow up CPAP Patient presents for follow-up on OSA/CPAP.  He states that he is compliant with CPAP and wears it nightly.  He states that he feels much less drowsy throughout the day after wearing it.  He states that his CPAP is over 77 years old and is requesting order for new CPAP today.  His SD card no longer works with his current CPAP so we cannot get a download today.  He denies any shortness of breath, chest pain, or edema.    Allergies  Allergen Reactions  . Iohexol Anaphylaxis and Shortness Of Breath     Code: HIVES, Desc: hives and tachycardia last time pt received IV CM kdean 07/19/06, Onset Date: [id number]   On 9/9 pt had a ct head w/ and received 13hr premedication. Was fine after scan, but broke out in hives when he got home.  Took 100mg  benedryl at home PO, and was fine.   . Codeine     agitation  . Dilantin [Phenytoin] Hives  . Heparin Hives  . Nuvigil [Armodafinil]     rash  . Other     [place]; Needs Kosher products  . Flagyl [Metronidazole] Rash    Immunization History  Administered Date(s) Administered  . Influenza Split 04/12/2016  . Influenza,inj,Quad PF,6+ Mos 05/27/2018  . Influenza-Unspecified 05/14/2014  . Pneumococcal-Unspecified 07/13/2012    Past Medical History:  Diagnosis Date  . Arthritis   . Asthma    excerise induced  . Brain abscess   . Chronic renal insufficiency, stage III (moderate) (HCC)   . DVT (deep venous thrombosis) ([place])   . History of osteomyelitis   . History of transfusion of platelets   . Hx of pulmonary  embolus    x 2  . Hypertension   . Neuropathy   . Patent foramen ovale   . Seizures ([facility])   . Sleep apnea    Sleep study done 5-6  years ago machine set at 10    Tobacco History: Social History   Tobacco Use  Smoking Status Never Smoker  Smokeless Tobacco Never Used   Counseling given: Yes   Outpatient Encounter Medications as of 06/30/2018  Medication Sig  . albuterol (PROVENTIL HFA;VENTOLIN HFA) 108 (90 BASE) MCG/ACT inhaler Inhale 2 puffs into the lungs every 6 (six) hours as needed. For shortness of breath  . azelastine (ASTELIN) 0.1 % nasal spray as needed.   Marland Kitchen escitalopram (LEXAPRO) 10 MG tablet Take 1 tablet by mouth daily.  . fluticasone-salmeterol (ADVAIR HFA) 115-21 MCG/ACT inhaler Advair HFA 115 mcg-21 mcg/actuation aerosol inhaler  . hydrochlorothiazide (MICROZIDE) 12.5 MG capsule Take 1 capsule by mouth daily.  . irbesartan (AVAPRO) 300 MG tablet Take 1 tablet by mouth daily.  Marland Kitchen ketoconazole (NIZORAL) 2 % shampoo as needed.  . levETIRAcetam (KEPPRA) 500 MG tablet Take 500 mg by mouth 2 (two) times daily.   . pantoprazole (PROTONIX) 40 MG tablet Take 40 mg by mouth daily.  Marland Kitchen albuterol (PROVENTIL) (2.5 MG/3ML) 0.083% nebulizer solution as needed.  Marland Kitchen  HYDROcodone-acetaminophen (NORCO) 5-325 MG tablet Take 1-2 tablets by mouth every 6 (six) hours as needed. (Patient not taking: Reported on 06/30/2018)  . [DISCONTINUED] fexofenadine (ALLEGRA) 180 MG tablet Take 180 mg by mouth daily.  . [DISCONTINUED] mometasone-formoterol (DULERA) 200-5 MCG/ACT AERO Inhale 2 puffs into the lungs 2 (two) times daily.   No facility-administered encounter medications on file as of 06/30/2018.      Review of Systems  Review of Systems  Constitutional: Negative.  Negative for chills and fever.  HENT: Negative.   Respiratory: Negative for cough, shortness of breath and wheezing.   Cardiovascular: Negative.  Negative for chest pain, palpitations and leg swelling.  Gastrointestinal:  Negative.   Allergic/Immunologic: Negative.   Neurological: Negative.   Psychiatric/Behavioral: Negative.        Physical Exam  BP (!) 140/98 (BP Location: Right Arm, Patient Position: Sitting, Cuff Size: Large)   Pulse 72   Ht 5' 6.75" (1.695 m)   Wt 187 lb (84.8 kg)   SpO2 100%   BMI 29.51 kg/m   Wt Readings from Last 5 Encounters:  06/30/18 187 lb (84.8 kg)  12/21/17 184 lb (83.5 kg)  12/21/17 183 lb (83 kg)  05/25/16 177 lb 3.2 oz (80.4 kg)  05/22/15 181 lb 6.4 oz (82.3 kg)     Physical Exam Vitals signs and nursing note reviewed.  Constitutional:      General: She is not in acute distress.    Appearance: She is well-developed.  Cardiovascular:     Rate and Rhythm: Normal rate and regular rhythm.  Pulmonary:     Effort: Pulmonary effort is normal.     Breath sounds: Normal breath sounds.  Skin:    General: Skin is warm and dry.  Neurological:     Mental Status: She is alert and oriented to person, place, and time.       Assessment & Plan:   Obstructive sleep apnea Patient Instructions  Patient continues to benefit from CPAP with good compliance We will order a new CPAP - current machine is older than 5 years - SD does not work for download Continue CPAP at current settings - set pressure at 10.5 cm H20 with mask of choice Continue current medications Goal of 4 hours or more usage per night Maintain healthy weight Do not drive if drowsy Follow up with Dr. Annamaria Boots in 1 month or sooner if needed        Fenton Foy, NP 06/30/2018

## 2018-06-30 NOTE — Assessment & Plan Note (Signed)
Patient Instructions  Patient continues to benefit from CPAP with good compliance We will order a new CPAP - current machine is older than 5 years - SD does not work for download Continue CPAP at current settings - set pressure at 10.5 cm H20 with mask of choice Continue current medications Goal of 4 hours or more usage per night Maintain healthy weight Do not drive if drowsy Follow up with Dr. Annamaria Boots in 1 month or sooner if needed

## 2018-06-30 NOTE — Patient Instructions (Addendum)
Patient continues to benefit from CPAP with good compliance We will order a new CPAP - current machine is older than 5 years - SD does not work for download Continue CPAP at current settings - set pressure at 10.5 cm H20 with mask of choice Continue current medications Goal of 4 hours or more usage per night Maintain healthy weight Do not drive if drowsy Follow up with Dr. Annamaria Boots in 1 month or sooner if needed

## 2018-07-01 DIAGNOSIS — G4733 Obstructive sleep apnea (adult) (pediatric): Secondary | ICD-10-CM | POA: Diagnosis not present

## 2018-07-14 DIAGNOSIS — G4733 Obstructive sleep apnea (adult) (pediatric): Secondary | ICD-10-CM | POA: Diagnosis not present

## 2018-08-14 DIAGNOSIS — G4733 Obstructive sleep apnea (adult) (pediatric): Secondary | ICD-10-CM | POA: Diagnosis not present

## 2018-09-12 DIAGNOSIS — G4733 Obstructive sleep apnea (adult) (pediatric): Secondary | ICD-10-CM | POA: Diagnosis not present

## 2018-10-13 DIAGNOSIS — G4733 Obstructive sleep apnea (adult) (pediatric): Secondary | ICD-10-CM | POA: Diagnosis not present

## 2018-11-12 DIAGNOSIS — G4733 Obstructive sleep apnea (adult) (pediatric): Secondary | ICD-10-CM | POA: Diagnosis not present

## 2019-02-16 ENCOUNTER — Other Ambulatory Visit: Payer: Self-pay

## 2019-02-17 ENCOUNTER — Ambulatory Visit (INDEPENDENT_AMBULATORY_CARE_PROVIDER_SITE_OTHER): Payer: 59 | Admitting: Internal Medicine

## 2019-02-17 ENCOUNTER — Encounter: Payer: Self-pay | Admitting: Internal Medicine

## 2019-02-17 VITALS — BP 118/88 | HR 79 | Temp 97.8°F | Ht 67.0 in | Wt 185.6 lb

## 2019-02-17 DIAGNOSIS — E063 Autoimmune thyroiditis: Secondary | ICD-10-CM | POA: Diagnosis not present

## 2019-02-17 DIAGNOSIS — R7989 Other specified abnormal findings of blood chemistry: Secondary | ICD-10-CM

## 2019-02-17 DIAGNOSIS — E038 Other specified hypothyroidism: Secondary | ICD-10-CM | POA: Diagnosis not present

## 2019-02-17 LAB — TSH: TSH: 12.8 u[IU]/mL — ABNORMAL HIGH (ref 0.35–4.50)

## 2019-02-17 LAB — T4, FREE: Free T4: 0.89 ng/dL (ref 0.60–1.60)

## 2019-02-17 NOTE — Progress Notes (Signed)
Name: [name]  MRN/ DOB: [id number], Sep 07, 1955    Age/ Sex: 63 y.o., adult    PCP: Cari Caraway, MD   Reason for Endocrinology Evaluation:      Date of Initial Endocrinology Evaluation: 02/17/2019     HPI: [name] is a 63 y.o. adult with a past medical history of HTN, OSA, PFO (S/P closure) seizure disorder, Hx of PE ( during hospitalization) and CKD. The patient presented for initial endocrinology clinic visit on 02/17/2019 for consultative assistance with his   Hypothyroid History:  Pt has been diagnosed with hypothyroidism may years ago.  He has tried levothyroxine and synthroid but developed LE neuropathy, he was then switched to armour thyroid and developed swelling after 2 months of taking it ,so he stopped all supplementation for a couple of years  with a TSH of 13.78 uIU/mL in 12/2018 with normal FT4.     Low Testosterone History : Pt presented to his PCP with c/o hair loss and fatigue, testosterone was checked with a minimally low total testosterone at 289 ng/dL [phone] .   He has loss of libido that he attributes to lexapro , has early morning erection. No intimate relation with wife due to severe vaginal dryness.   Today his energy level has improved, has lost 8 lbs in the 2.5 weeks ago since making diet changes. Lift weights for 40 yrs. No supplements over the counter. Has chronic constipation, but no depression, on lexapro for anxiety.   Has thyroid enlargements ~ 5 yrs but recently has reduced in size.    Sister/ nephew with hashimoto's thyroiditis.     HISTORY:  Past Medical History:  Past Medical History:  Diagnosis Date  . Arthritis   . Asthma    excerise induced  . Brain abscess   . Chronic renal insufficiency, stage III (moderate) (HCC)   . DVT (deep venous thrombosis) ([place])   . History of osteomyelitis   . History of transfusion of platelets   . Hx of pulmonary embolus    x 2  . Hypertension   . Neuropathy   . Patent  foramen ovale   . Seizures ([place])   . Sleep apnea    Sleep study done 5-6  years ago machine set at 10   Past Surgical History:  Past Surgical History:  Procedure Laterality Date  . ANTERIOR CRUCIATE LIGAMENT REPAIR    . BRAIN SURGERY     abscess drainage x 2  . CARPAL TUNNEL RELEASE    . CRANIOTOMY    . CRANIOTOMY  03/30/2012   Procedure: CRANIOTOMY TUMOR EXCISION;  Surgeon: W[place] Cunas, MD;  Location: [place] [facility];  Service: Neurosurgery;  Laterality: Right;   craniotomy for intracranial mass.  . CRANIOTOMY  03/31/2012   Procedure: CRANIOTOMY TUMOR EXCISION;  Surgeon: [place] Cunas, MD;  Location: [place] [facility] ORS;  Service: Neurosurgery;  Laterality: Right;  Craniotomy for excision of mass  . Deep Vein    . FRACTURE SURGERY     right arm  . LAPAROSCOPIC APPENDECTOMY  11/20/2011  . LAPAROSCOPIC APPENDECTOMY  11/20/2011   Procedure: APPENDECTOMY LAPAROSCOPIC;  Surgeon: Harl Bowie, MD;  Location: [facility];  Service: General;  Laterality: N/A;  . PATENT FORAMEN OVALE CLOSURE    . Pulmonary Embolus    . ULNAR TUNNEL RELEASE        Social History:  reports that she has never smoked. She has never used smokeless tobacco. She reports that  she does not drink alcohol or use drugs.  Family History: family history includes Allergies in her unknown relative; Asthma in her unknown relative.   HOME MEDICATIONS: Allergies as of 02/17/2019      Reactions   Iohexol Anaphylaxis, Shortness Of Breath    Code: HIVES, Desc: hives and tachycardia last time pt received IV CM kdean 07/19/06, Onset Date: [id number]   On 9/9 pt had a ct head w/ and received 13hr premedication. Was fine after scan, but broke out in hives when he got home.  Took 100mg  benedryl at home PO, and was fine.   Codeine    agitation   Dilantin [phenytoin] Hives   Heparin Hives   Nuvigil [armodafinil]    rash   Other    Follows Kosher Diet; Needs Kosher products   Flagyl [metronidazole] Rash      Medication List        Accurate as of February 17, 2019 10:16 AM. If you have any questions, ask your nurse or doctor.        Advair HFA 115-21 MCG/ACT inhaler Generic drug: fluticasone-salmeterol Advair HFA 115 mcg-21 mcg/actuation aerosol inhaler   albuterol 108 (90 Base) MCG/ACT inhaler Commonly known as: VENTOLIN HFA Inhale 2 puffs into the lungs every 6 (six) hours as needed. For shortness of breath   albuterol (2.5 MG/3ML) 0.083% nebulizer solution Commonly known as: PROVENTIL as needed.   azelastine 0.1 % nasal spray Commonly known as: ASTELIN as needed.   escitalopram 10 MG tablet Commonly known as: LEXAPRO Take 1 tablet by mouth daily.   hydrochlorothiazide 12.5 MG capsule Commonly known as: MICROZIDE Take 1 capsule by mouth daily.   HYDROcodone-acetaminophen 5-325 MG tablet Commonly known as: Norco Take 1-2 tablets by mouth every 6 (six) hours as needed.   irbesartan 300 MG tablet Commonly known as: AVAPRO Take 1 tablet by mouth daily.   ketoconazole 2 % shampoo Commonly known as: NIZORAL as needed.   levETIRAcetam 500 MG tablet Commonly known as: KEPPRA Take 500 mg by mouth 2 (two) times daily.   pantoprazole 40 MG tablet Commonly known as: PROTONIX Take 40 mg by mouth daily.         REVIEW OF SYSTEMS: A comprehensive ROS was conducted with the patient and is negative except as per HPI and below:  Review of Systems  Constitutional: Positive for malaise/fatigue and weight loss.  HENT: Negative for congestion and sore throat.   Eyes: Negative for blurred vision and pain.  Respiratory: Negative for cough and shortness of breath.   Cardiovascular: Negative for chest pain and palpitations.  Gastrointestinal: Positive for constipation. Negative for nausea.  Genitourinary: Negative for frequency.  Neurological: Negative for tingling and tremors.  Endo/Heme/Allergies: Negative for polydipsia.  Psychiatric/Behavioral: Negative for depression. The patient is nervous/anxious.         OBJECTIVE:  VS: BP 118/88 (BP Location: Left Arm, Patient Position: Sitting, Cuff Size: Normal)   Pulse 79   Temp 97.8 F (36.6 C)   Ht 5\' 7"  (1.702 m)   Wt 185 lb 9.6 oz (84.2 kg)   SpO2 96%   BMI 29.07 kg/m    Wt Readings from Last 3 Encounters:  02/17/19 185 lb 9.6 oz (84.2 kg)  06/30/18 187 lb (84.8 kg)  12/21/17 184 lb (83.5 kg)     EXAM: General: Pt appears well and is in NAD  Hydration: Well-hydrated with moist mucous membranes and good skin turgor  Eyes: External eye exam normal without stare, lid lag  or exophthalmos.  EOM intact.  PERRL.  Ears, Nose, Throat: Hearing: Grossly intact bilaterally Dental: Good dentition  Throat: Clear without mass, erythema or exudate  Neck: General: Supple without adenopathy. Thyroid: Thyroid size normal.  No goiter or nodules appreciated. No thyroid bruit.  Chest  Clear with good BS bilat with no rales, rhonchi, or wheezes No gynecoastia  Heart: Auscultation: RRR.  Abdomen: Normoactive bowel sounds, soft, nontender, without masses or organomegaly palpable  Genital :  Normal penile exam.  Testicular size at least 25 mL, R> L   Extremities:  BL LE: No pretibial edema normal ROM and strength.  Skin: Hair: Texture and amount normal with gender appropriate distribution Skin Inspection: No rashes Skin Palpation: Skin temperature, texture, and thickness normal to palpation  Neuro: Cranial nerves: II - XII grossly intact  Cerebellar: Normal coordination and movement; no tremor Motor: Normal strength throughout DTRs: 2+ and symmetric in UE without delay in relaxation phase  Mental Status: Judgment, insight: Intact Memory: Intact for recent and remote events Mood and affect: No depression, anxiety, or agitation     DATA REVIEWED: 01/04/2019  TSH 13.78 uIU/mL T.Col 246 mg/dL  TG 209 mg/dL  HDL 45 mg/dL  LDL 160 mg/dL    BUN 28 Cr 1.64 GFR 52 mg/dL  ALP 53 AST 25 ALT 21  H/H 15.1/43.2  PSA 2.8 ng/mL     Testosterone 289 ng/dL [phone]    Anti-TPO Antibodies 198 UI/mL ( 0-34)  ASSESSMENT/PLAN/RECOMMENDATIONS:   1. Hashimoto's Thyroiditis:  - Fatigue and hair loss consistent with hypothyroidism  - Pt under the impression he is intolerant to LT-4 replacement, I tried to explain to him that neuropathy and LE edema is a sign of hypothyroidism.  - Will consider starting him on the 50 mcg tablets as they are dye free   Medications :  Levothyroxine 50 mcg daily    2. Minimally Low Total Testosterone :  - I suspect this has to do with low sex hormone binding globulin, given heavier weight at the time, no free testosterone available.  - We discussed the importance of timing and fasting status in testosterone recheck.  - He has no clinical evidence of hypogonadism.  - We discussed side effects of testosterone therapy such as erythropoiesis, worsening of sleep apnea that is severe and untreated,  Prostate volumes and serum PSA increase in response to testosterone treatment which might increase BPH and worsen urinary outflow obstruction as well as prostate cancer risk.  - We also discussed there is a possibility of increased cardiovascular risk associated with testosterone use. - Pt is not interested in pursuing this any further at this time    F/u in 3 months    Signed electronically by: Mack Guise, MD  [facility]  [facility] [address] [place], [place] [address] Phone: [phone] FAX: [phone]   CC: Cari Caraway, [place] [address] Phone: [phone] Fax: [phone]   Return to Endocrinology clinic as below: No future appointments.

## 2019-02-17 NOTE — Patient Instructions (Signed)
-   We are only checking your thyroid today, please come by next week at 8 Am (fasting ) for testosterone recheck

## 2019-02-20 ENCOUNTER — Encounter: Payer: Self-pay | Admitting: Internal Medicine

## 2019-02-20 MED ORDER — LEVOTHYROXINE SODIUM 50 MCG PO TABS: 50.0000 ug | ORAL_TABLET | Freq: Every day | ORAL | 1 refills | Status: DC

## 2019-04-12 ENCOUNTER — Encounter: Payer: Self-pay | Admitting: Internal Medicine

## 2019-05-04 ENCOUNTER — Other Ambulatory Visit: Payer: Self-pay

## 2019-05-04 ENCOUNTER — Encounter: Payer: Self-pay | Admitting: Internal Medicine

## 2019-05-04 MED ORDER — LEVOTHYROXINE SODIUM 50 MCG PO TABS: 50.0000 ug | ORAL_TABLET | Freq: Every day | ORAL | 1 refills | Status: DC

## 2019-05-16 ENCOUNTER — Encounter: Payer: Self-pay | Admitting: Internal Medicine

## 2019-05-16 ENCOUNTER — Other Ambulatory Visit: Payer: Self-pay

## 2019-05-19 ENCOUNTER — Ambulatory Visit: Payer: 59 | Admitting: Internal Medicine

## 2019-05-29 ENCOUNTER — Other Ambulatory Visit: Payer: Self-pay

## 2019-05-31 ENCOUNTER — Telehealth: Payer: Self-pay | Admitting: Internal Medicine

## 2019-05-31 ENCOUNTER — Other Ambulatory Visit: Payer: Self-pay

## 2019-05-31 ENCOUNTER — Encounter: Payer: Self-pay | Admitting: Internal Medicine

## 2019-05-31 ENCOUNTER — Ambulatory Visit (INDEPENDENT_AMBULATORY_CARE_PROVIDER_SITE_OTHER): Payer: 59 | Admitting: Internal Medicine

## 2019-05-31 VITALS — BP 120/80 | HR 63 | Ht 67.0 in | Wt 185.0 lb

## 2019-05-31 DIAGNOSIS — E063 Autoimmune thyroiditis: Secondary | ICD-10-CM

## 2019-05-31 DIAGNOSIS — E038 Other specified hypothyroidism: Secondary | ICD-10-CM

## 2019-05-31 LAB — T4, FREE: Free T4: 0.97 ng/dL (ref 0.60–1.60)

## 2019-05-31 LAB — TSH: TSH: 5.94 u[IU]/mL — ABNORMAL HIGH (ref 0.35–4.50)

## 2019-05-31 NOTE — Telephone Encounter (Signed)
[facility] staff stated pt called requesting that his appointment today be virtual because he does not feel comfortable coming in office.  Spoke with Dr. Kelton Pillar in clinic and she states this visit has to be in office due to needing blood work. She states if pt is still not comfortable we can cancel his appointment for today and tell him to call us back to reschedule when he is comfortable to come in office.   Attempted to reach pt, no answer. Left vm to call back

## 2019-05-31 NOTE — Progress Notes (Signed)
Name: [name]  MRN/ DOB: [id number], Feb 26, 1956    Age/ Sex: 63 y.o., male     PCP: Cari Caraway, MD   Reason for Endocrinology Evaluation: Hypothyroidism     Initial Endocrinology Clinic Visit: 02/17/2019    PATIENT IDENTIFIER: [name] is a 63 y.o., male with a past medical history of HTN, OSA, PFO (S/P closure) seizure disorder, Hx of PE ( during hospitalization) and CKD He has followed with [facility] since 02/17/2019 for consultative assistance with management of his hypothyroidism   HISTORICAL SUMMARY: The patient was first diagnosed with hypothyroidism secondary to Hashimoto's thyroiditis may years ago.  He has tried levothyroxine and synthroid but developed LE neuropathy, he was then switched to armour thyroid and developed swelling after 2 months of taking it ,so he stopped all supplementation for a couple of years  with a TSH of 13.78 uIU/mL in 12/2018 with normal FT4.    Sister/ nephew with hashimoto's thyroiditis.  SUBJECTIVE:   During last visit (02/17/2019): We started Levothyroxine 50 mcg daily   Today (06/01/2019):  [name] is here for is here for a follow up on hypothyroidism . He has been compliant with LT-4 replacement . He denies any side effects.   His weight has been stable.   No change in energy level. But he has chronic insomnia  No constipation  On lexapro  for anxiety  Has chronic dizzy.    No biotin intake     ROS:  As per HPI.   HISTORY:  Past Medical History:  Past Medical History:  Diagnosis Date  . Arthritis   . Asthma    excerise induced  . Brain abscess   . Chronic renal insufficiency, stage III (moderate)   . DVT (deep venous thrombosis) ([place])   . History of osteomyelitis   . History of transfusion of platelets   . Hx of pulmonary embolus    x 2  . Hypertension   . Neuropathy   . Patent foramen ovale   . Seizures ([place])   . Sleep apnea    Sleep study done 5-6  years ago machine set at  10   Past Surgical History:  Past Surgical History:  Procedure Laterality Date  . ANTERIOR CRUCIATE LIGAMENT REPAIR    . BRAIN SURGERY     abscess drainage x 2  . CARPAL TUNNEL RELEASE    . CRANIOTOMY    . CRANIOTOMY  03/30/2012   Procedure: CRANIOTOMY TUMOR EXCISION;  Surgeon: [place] Cunas, MD;  Location: [place] [facility] ORS;  Service: Neurosurgery;  Laterality: Right;   craniotomy for intracranial mass.  . CRANIOTOMY  03/31/2012   Procedure: CRANIOTOMY TUMOR EXCISION;  Surgeon: [place] Cunas, MD;  Location: [place] NEUR[facility] ORS;  Service: Neurosurgery;  Laterality: Right;  Craniotomy for excision of mass  . Deep Vein    . FRACTURE SURGERY     right arm  . LAPAROSCOPIC APPENDECTOMY  11/20/2011  . LAPAROSCOPIC APPENDECTOMY  11/20/2011   Procedure: APPENDECTOMY LAPAROSCOPIC;  Surgeon: Harl Bowie, MD;  Location: [facility];  Service: General;  Laterality: N/A;  . PATENT FORAMEN OVALE CLOSURE    . Pulmonary Embolus    . ULNAR TUNNEL RELEASE      Social History:  reports that he has never smoked. He has never used smokeless tobacco. He reports that he does not drink alcohol or use drugs. Family History:  Family History  Problem Relation Age of Onset  . Allergies Unknown  whole family  . Asthma Unknown        whole family     HOME MEDICATIONS: Allergies as of 05/31/2019      Reactions   Iodinated Diagnostic Agents Anaphylaxis   Iohexol Anaphylaxis, Shortness Of Breath    Code: HIVES, Desc: hives and tachycardia last time pt received IV CM kdean 07/19/06, Onset Date: [id number]   On 9/9 pt had a ct head w/ and received 13hr premedication. Was fine after scan, but broke out in hives when he got home.  Took 100mg  benedryl at home PO, and was fine.   Codeine    agitation   Dilantin [phenytoin] Hives   Heparin Hives   Nuvigil [armodafinil]    rash   Other    Follows Kosher Diet; Needs Kosher products   Flagyl [metronidazole] Rash      Medication List       Accurate as of May 31, 2019 11:59 PM. If you have any questions, ask your nurse or doctor.        STOP taking these medications   HYDROcodone-acetaminophen 5-325 MG tablet Commonly known as: Norco Stopped by: Dorita Sciara, MD     TAKE these medications   Advair HFA 115-21 MCG/ACT inhaler Generic drug: fluticasone-salmeterol Advair HFA 115 mcg-21 mcg/actuation aerosol inhaler   albuterol 108 (90 Base) MCG/ACT inhaler Commonly known as: VENTOLIN HFA Inhale 2 puffs into the lungs every 6 (six) hours as needed. For shortness of breath What changed: Another medication with the same name was removed. Continue taking this medication, and follow the directions you see here. Changed by: Dorita Sciara, MD   azelastine 0.1 % nasal spray Commonly known as: ASTELIN as needed.   escitalopram 10 MG tablet Commonly known as: LEXAPRO Take 1 tablet by mouth daily.   hydrochlorothiazide 12.5 MG capsule Commonly known as: MICROZIDE Take 1 capsule by mouth daily.   irbesartan 150 MG tablet Commonly known as: AVAPRO Take by mouth daily. What changed: Another medication with the same name was removed. Continue taking this medication, and follow the directions you see here. Changed by: Dorita Sciara, MD   ketoconazole 2 % shampoo Commonly known as: NIZORAL as needed.   levETIRAcetam 500 MG tablet Commonly known as: KEPPRA Take 500 mg by mouth 2 (two) times daily.   levothyroxine 50 MCG tablet Commonly known as: SYNTHROID Take 1 tablet (50 mcg total) by mouth daily.   pantoprazole 40 MG tablet Commonly known as: PROTONIX Take 40 mg by mouth daily.         OBJECTIVE:   PHYSICAL EXAM: VS: BP 120/80   Pulse 63   Ht 5\' 7"  (1.702 m)   Wt 185 lb (83.9 kg)   SpO2 98%   BMI 28.98 kg/m    EXAM: General: Pt appears well and is in NAD  Neck: General: Supple without adenopathy. Thyroid: Thyroid size normal.  No goiter or nodules appreciated. No thyroid bruit.  Lungs: Clear  with good BS bilat with no rales, rhonchi, or wheezes  Heart: Auscultation: RRR.  Abdomen: Normoactive bowel sounds, soft, nontender, without masses or organomegaly palpable  Extremities:  BL LE: No pretibial edema normal ROM and strength.  Mental Status: Judgment, insight: Intact Orientation: Oriented to time, place, and person Mood and affect: No depression, anxiety, or agitation     DATA REVIEWED: Results for [name], [name] (MRN [id number]) as of 06/01/2019 09:34  Ref. Range 02/17/2019 10:56 05/31/2019 11:50  TSH Latest Ref Range: 0.35 -  4.50 uIU/mL 12.80 (H) 5.94 (H)  T4,Free(Direct) Latest Ref Range: 0.60 - 1.60 ng/dL 0.89 0.97       04/11/2019 TSH 6.48 uIU/mL   ASSESSMENT / PLAN / RECOMMENDATIONS:   1. Hypothyroidism:  - Pt with non-specific symptoms that could be attributed to hypothyroidism - Pt educated extensively on the correct way to take levothyroxine (first thing in the morning with water, 30 minutes before eating or taking other medications). - Pt encouraged to double dose the following day if she were to miss a dose given long half-life of levothyroxine.   Medications   Increase Levothyroxine 50 mcg, 2 tablets on Sundays and 1 tablet the rest of the week     F/U in 6 months  Signed electronically by: Mack Guise, MD  [facility]  [facility] [address] [place], [place] [address] Phone: [phone] FAX: [phone]      CC: Cari Caraway, [place] [address] Phone: [phone]  Fax: [phone]   Return to Endocrinology clinic as below: Future Appointments  Date Time Provider [place]  11/29/2019 11:10 AM Bonne Whack, Melanie Crazier, MD [id number] None

## 2019-05-31 NOTE — Patient Instructions (Signed)

## 2019-06-01 ENCOUNTER — Encounter: Payer: Self-pay | Admitting: Internal Medicine

## 2019-06-01 MED ORDER — LEVOTHYROXINE SODIUM 50 MCG PO TABS: 50.0000 ug | ORAL_TABLET | ORAL | 1 refills | Status: DC

## 2019-08-07 ENCOUNTER — Other Ambulatory Visit: Payer: Self-pay | Admitting: Internal Medicine

## 2019-08-07 NOTE — Telephone Encounter (Signed)
FYI

## 2019-11-23 ENCOUNTER — Encounter: Payer: Self-pay | Admitting: Internal Medicine

## 2019-11-28 NOTE — Progress Notes (Signed)
Name: [name]  MRN/ DOB: [id number], 1956-01-07    Age/ Sex: 64 y.o., male     PCP: Cari Caraway, MD   Reason for Endocrinology Evaluation: Hypothyroidism     Initial Endocrinology Clinic Visit: 02/17/2019    PATIENT IDENTIFIER: [name] is a 64 y.o., male with a past medical history of HTN, OSA, PFO (S/P closure) seizure disorder, Hx of PE ( during hospitalization) and CKD He has followed with [facility] since 02/17/2019 for consultative assistance with management of his hypothyroidism   HISTORICAL SUMMARY: The patient was first diagnosed with hypothyroidism secondary to Hashimoto's thyroiditis may years ago.  He has tried levothyroxine and synthroid but developed LE neuropathy, he was then switched to armour thyroid and developed swelling after 2 months of taking it ,so he stopped all supplementation for a couple of years  with a TSH of 13.78 uIU/mL in 12/2018 with normal FT4.    Sister/ nephew with hashimoto's thyroiditis.  SUBJECTIVE:   During last visit (05/31/2019): We increased  Levothyroxine 50 mcg daily   Today (11/29/2019):  [name] is here for is here for a follow up on hypothyroidism . He has been compliant with LT-4 replacement . He denies any side effects.   His weight has increased.   His energy level is low but he continues with insomnia , falls asleep at work  No constipation  On lexapro  for anxiety   He has ED issues , no spontaneous erection , interested in   Brother with hx of prostate cancer    ROS:  As per HPI.   HISTORY:  Past Medical History:  Past Medical History:  Diagnosis Date  . Arthritis   . Asthma    excerise induced  . Brain abscess   . Chronic renal insufficiency, stage III (moderate)   . DVT (deep venous thrombosis) ([place])   . History of osteomyelitis   . History of transfusion of platelets   . Hx of pulmonary embolus    x 2  . Hypertension   . Neuropathy   . Patent foramen ovale   .  Seizures ([place])   . Sleep apnea    Sleep study done 5-6  years ago machine set at 10   Past Surgical History:  Past Surgical History:  Procedure Laterality Date  . ANTERIOR CRUCIATE LIGAMENT REPAIR    . BRAIN SURGERY     abscess drainage x 2  . CARPAL TUNNEL RELEASE    . CRANIOTOMY    . CRANIOTOMY  03/30/2012   Procedure: CRANIOTOMY TUMOR EXCISION;  Surgeon: Winfield Cunas, MD;  Location: [facility];  Service: Neurosurgery;  Laterality: Right;   craniotomy for intracranial mass.  . CRANIOTOMY  03/31/2012   Procedure: CRANIOTOMY TUMOR EXCISION;  Surgeon: Winfield Cunas, MD;  Location: [facility];  Service: Neurosurgery;  Laterality: Right;  Craniotomy for excision of mass  . Deep Vein    . FRACTURE SURGERY     right arm  . LAPAROSCOPIC APPENDECTOMY  11/20/2011  . LAPAROSCOPIC APPENDECTOMY  11/20/2011   Procedure: APPENDECTOMY LAPAROSCOPIC;  Surgeon: Harl Bowie, MD;  Location: [facility];  Service: General;  Laterality: N/A;  . PATENT FORAMEN OVALE CLOSURE    . Pulmonary Embolus    . ULNAR TUNNEL RELEASE      Social History:  reports that he has never smoked. He has never used smokeless tobacco. He reports that he does not drink alcohol or use drugs.  Family History:  Family History  Problem Relation Age of Onset  . Allergies Unknown        whole family  . Asthma Unknown        whole family     HOME MEDICATIONS: Allergies as of 11/29/2019      Reactions   Iodinated Diagnostic Agents Anaphylaxis   Iohexol Anaphylaxis, Shortness Of Breath    Code: HIVES, Desc: hives and tachycardia last time pt received IV CM kdean 07/19/06, Onset Date: [id number]   On 9/9 pt had a ct head w/ and received 13hr premedication. Was fine after scan, but broke out in hives when he got home.  Took 100mg  benedryl at home PO, and was fine.   Codeine    agitation   Dilantin [phenytoin] Hives   Heparin Hives   Nuvigil [armodafinil]    rash   Other    Follows Kosher Diet; Needs Kosher products    Flagyl [metronidazole] Rash      Medication List       Accurate as of Nov 29, 2019 12:37 PM. If you have any questions, ask your nurse or doctor.        Advair HFA 115-21 MCG/ACT inhaler Generic drug: fluticasone-salmeterol Advair HFA 115 mcg-21 mcg/actuation aerosol inhaler   albuterol 108 (90 Base) MCG/ACT inhaler Commonly known as: VENTOLIN HFA Inhale 2 puffs into the lungs every 6 (six) hours as needed. For shortness of breath   azelastine 0.1 % nasal spray Commonly known as: ASTELIN as needed.   escitalopram 10 MG tablet Commonly known as: LEXAPRO Take 1 tablet by mouth daily.   hydrochlorothiazide 12.5 MG capsule Commonly known as: MICROZIDE Take 1 capsule by mouth daily.   irbesartan 150 MG tablet Commonly known as: AVAPRO Take by mouth daily.   ketoconazole 2 % shampoo Commonly known as: NIZORAL as needed.   levETIRAcetam 500 MG tablet Commonly known as: KEPPRA Take 500 mg by mouth 2 (two) times daily.   levothyroxine 50 MCG tablet Commonly known as: SYNTHROID TAKE 1 TABLET BY MOUTH  DAILY   pantoprazole 40 MG tablet Commonly known as: PROTONIX Take 40 mg by mouth daily.         OBJECTIVE:   PHYSICAL EXAM: VS: BP 117/68 (BP Location: Left Arm, Patient Position: Sitting, Cuff Size: Normal)   Pulse 80   Temp 97.8 F (36.6 C)   Ht 5\' 7"  (1.702 m)   Wt 190 lb (86.2 kg)   SpO2 90%   BMI 29.76 kg/m    EXAM: General: Pt appears well and is in NAD  Neck: General: Supple without adenopathy. Thyroid: Thyroid size normal.  No goiter or nodules appreciated.   Lungs: Clear with good BS bilat with no rales, rhonchi, or wheezes  Heart: Auscultation: RRR.  Abdomen: Normoactive bowel sounds, soft, nontender, without masses or organomegaly palpable  Extremities:  BL LE: No pretibial edema normal ROM and strength.  Mental Status: Judgment, insight: Intact Orientation: Oriented to time, place, and person Mood and affect: No depression, anxiety, or  agitation     DATA REVIEWED:  Results for WAHAB, SCARPINATO (MRN [id number]) as of 11/30/2019 10:58  Ref. Range 05/31/2019 11:50 11/29/2019 11:45  TSH Latest Ref Range: 0.35 - 4.50 uIU/mL 5.94 (H) 5.73 (H)  T4,Free(Direct) Latest Ref Range: 0.60 - 1.60 ng/dL 0.97 0.87       04/11/2019 TSH 6.48 uIU/mL   ASSESSMENT / PLAN / RECOMMENDATIONS:   1. Hypothyroidism:  - Pt with non-specific symptoms that could  be attributed to hypothyroidism - Pt educated extensively on the correct way to take levothyroxine (first thing in the morning with water, 30 minutes before eating or taking other medications). - Pt encouraged to double dose the following day if she were to miss a dose given long half-life of levothyroxine.   Medications    Levothyroxine 50 mcg, 2 tablets on Saturdays and  Sundays and 1 tablet the rest of the week   2. Fatigue :  - Discussed D/D of uncontrolled OSA, narcolepsy, thyroid disease. Pt mentioned testosterone as a cause.I offered to recheck it in the morning ( fasting) but after discussed side effects, pt declined further testing due to [place] of prostate cancer, and in his opinion he is not willing to take the risk of prostate cancer.      F/U in 6 months  Signed electronically by: Mack Guise, MD  [facility]  [facility] [address] [place], [place] [address] Phone: [phone] FAX: [phone]      CC: Cari Caraway, [place] [address] Phone: [phone]  Fax: [phone]   Return to Endocrinology clinic as below: Future Appointments  Date Time Provider [place]  05/30/2020 11:10 AM Atlee Villers, Melanie Crazier, MD [facility] None

## 2019-11-29 ENCOUNTER — Ambulatory Visit (INDEPENDENT_AMBULATORY_CARE_PROVIDER_SITE_OTHER): Payer: 59 | Admitting: Internal Medicine

## 2019-11-29 ENCOUNTER — Other Ambulatory Visit: Payer: Self-pay

## 2019-11-29 ENCOUNTER — Encounter: Payer: Self-pay | Admitting: Internal Medicine

## 2019-11-29 VITALS — BP 117/68 | HR 80 | Temp 97.8°F | Ht 67.0 in | Wt 190.0 lb

## 2019-11-29 DIAGNOSIS — E038 Other specified hypothyroidism: Secondary | ICD-10-CM | POA: Diagnosis not present

## 2019-11-29 DIAGNOSIS — R5383 Other fatigue: Secondary | ICD-10-CM

## 2019-11-29 DIAGNOSIS — E063 Autoimmune thyroiditis: Secondary | ICD-10-CM | POA: Diagnosis not present

## 2019-11-29 LAB — TSH: TSH: 5.73 u[IU]/mL — ABNORMAL HIGH (ref 0.35–4.50)

## 2019-11-29 LAB — T4, FREE: Free T4: 0.87 ng/dL (ref 0.60–1.60)

## 2019-11-29 NOTE — Patient Instructions (Signed)

## 2019-11-30 ENCOUNTER — Encounter: Payer: Self-pay | Admitting: Internal Medicine

## 2019-11-30 MED ORDER — LEVOTHYROXINE SODIUM 50 MCG PO TABS: 50.0000 ug | ORAL_TABLET | Freq: Every day | ORAL | 3 refills | Status: DC

## 2020-02-14 ENCOUNTER — Other Ambulatory Visit: Payer: Self-pay

## 2020-02-14 ENCOUNTER — Other Ambulatory Visit: Payer: Self-pay | Admitting: Family Medicine

## 2020-02-14 ENCOUNTER — Ambulatory Visit
Admission: RE | Admit: 2020-02-14 | Discharge: 2020-02-14 | Disposition: A | Discharge status: Home / Self Care | Payer: 59 | Source: Ambulatory Visit | Attending: Family Medicine | Admitting: Family Medicine

## 2020-02-14 DIAGNOSIS — J45909 Unspecified asthma, uncomplicated: Secondary | ICD-10-CM

## 2020-02-14 DIAGNOSIS — R0602 Shortness of breath: Secondary | ICD-10-CM

## 2020-02-14 DIAGNOSIS — R059 Cough, unspecified: Secondary | ICD-10-CM

## 2020-05-07 ENCOUNTER — Ambulatory Visit: Payer: 59 | Admitting: Internal Medicine

## 2020-05-30 ENCOUNTER — Ambulatory Visit: Payer: 59 | Admitting: Internal Medicine

## 2020-06-18 NOTE — Progress Notes (Signed)
HPI M never smoker followed for OSA, complicated by hx brain abscess/craniotomy/seizure, seasonal allergic rhinitis, HBP, CKD3, VTE, GERD NPSG 03/22/14 AHI 30.6/ hr, CPAP 9, weight 190 lbs  =============================================  05/25/2016-64 year old male never smoker followed for OSA, complicated by history brain abscess/craniotomy/seizure, seasonal allergic rhinitis, asthmatic bronchitis, HBP, CK D, CDE/PE, GERD CPAP 10.5/[facility] FOLLOWS FOR: DME: [facility]. Pt is not wearing CPAP every night-has been sick and then sometimes falls asleep before putting CPAP machine on.  We are unable to access his card for download this morning and are requesting download from his DME company. He is convinced he is better off with CPAP and wife tells him he snores without it. Needed prednisone for a viral bronchitis last month. Stays on Allegra and Astelin nasal spray for perennial rhinitis.  06/19/20- 64 year old male never smoker followed for OSA, complicated by history brain abscess/craniotomy/seizure, seasonal allergic rhinitis, asthmatic bronchitis, HBP, CK D, CDE/PE, GERD, Hypothyroid,  CPAP 23/ [employer] Download-compliance 80%, AHI 1.2/ hr  Machine is 64 year old Body weight today-189 lbs Covid vax- 2Phizer Flu vax-had Discussed long hx rhinitis with drip since childhood. Broadly skint test positive for environmental allergens, but vaccine did not help. Flare of asthmatic bronchitis treated with Zpak, 2 rounds prednisone. Still frequent cough- dry. Daily Astelin.Estanislado Spire no help. Doess very well with codeine cough syrup- discussed. Denies sinus infection. GERD is controlled. Taking daily Allegra. Plays trumpet, lifts weights. CXR - 02/14/20- IMPRESSION: No active cardiopulmonary disease.   ROS-see HPI                 + = pos Constitutional:   No-   weight loss, night sweats, fevers, chills, fatigue, lassitude. HEENT:   No-  headaches, difficulty swallowing, tooth/dental problems, sore throat,        No-  sneezing, itching, ear ache, + nasal congestion, post nasal drip,  CV:  No-   chest pain, orthopnea, PND, swelling in lower extremities, anasarca,                                               dizziness, palpitations Resp: No-   shortness of breath with exertion or at rest.              No-   productive cough,  No non-productive cough,  No- coughing up of blood.              No-   change in color of mucus.  No- wheezing.   Skin: No-   rash or lesions. GI:  No-   heartburn, indigestion, abdominal pain, nausea, vomiting,  GU:  MS:  + joint pain or swelling.  Neuro-    + Chronic neuropathic weakness left hand Psych:  No- change in mood or affect. No depression or anxiety.  No memory loss.  OBJ- Physical Exam General- Alert, Oriented, Affect-appropriate, Distress- none acute, + beard Skin- rash-none, lesions- none, excoriation- none Lymphadenopathy- none Head- atraumatic            Eyes- Gross vision intact, PERRLA, conjunctivae and secretions clear            Ears- Hearing, canals-normal            Nose-   + turbinate edema, no-Septal dev, mucus, polyps, erosion, perforation             Throat- Mallampati III , mucosa  clear , drainage- none, tonsils- atrophic Neck- flexible , trachea midline, no stridor , thyroid nl, carotid no bruit Chest - symmetrical excursion , unlabored           Heart/CV- RRR , no murmur , no gallop  , no rub, nl s1 s2                           - JVD- none , edema- none, stasis changes- none, varices- none           Lung- clear to P&A, wheeze- none, cough- none , dullness-none, rub- none           Chest wall-  Abd-  Br/ Gen/ Rectal- Not done, not indicated Extrem- cyanosis- none, clubbing, none, atrophy- none, strength- nl, + brace left wrist Neuro- articulate, weakness left hand by history, not examined

## 2020-06-19 ENCOUNTER — Ambulatory Visit (INDEPENDENT_AMBULATORY_CARE_PROVIDER_SITE_OTHER): Payer: 59 | Admitting: Internal Medicine

## 2020-06-19 ENCOUNTER — Ambulatory Visit (INDEPENDENT_AMBULATORY_CARE_PROVIDER_SITE_OTHER): Payer: 59

## 2020-06-19 ENCOUNTER — Other Ambulatory Visit: Payer: Self-pay

## 2020-06-19 ENCOUNTER — Encounter: Payer: Self-pay | Admitting: Internal Medicine

## 2020-06-19 VITALS — BP 108/78 | HR 83 | Temp 97.6°F | Ht 66.75 in | Wt 189.6 lb

## 2020-06-19 DIAGNOSIS — J302 Other seasonal allergic rhinitis: Secondary | ICD-10-CM

## 2020-06-19 DIAGNOSIS — R059 Cough, unspecified: Secondary | ICD-10-CM

## 2020-06-19 DIAGNOSIS — J4520 Mild intermittent asthma, uncomplicated: Secondary | ICD-10-CM | POA: Diagnosis not present

## 2020-06-19 DIAGNOSIS — G4733 Obstructive sleep apnea (adult) (pediatric): Secondary | ICD-10-CM | POA: Diagnosis not present

## 2020-06-19 DIAGNOSIS — J3089 Other allergic rhinitis: Secondary | ICD-10-CM | POA: Diagnosis not present

## 2020-06-19 LAB — CBC WITH DIFFERENTIAL/PLATELET
Basophils Absolute: 0 10*3/uL (ref 0.0–0.1)
Basophils Relative: 0.5 % (ref 0.0–3.0)
Eosinophils Absolute: 0.1 10*3/uL (ref 0.0–0.7)
Eosinophils Relative: 1.6 % (ref 0.0–5.0)
HCT: 42.1 % (ref 39.0–52.0)
Hemoglobin: 14.4 g/dL (ref 13.0–17.0)
Lymphocytes Relative: 22.9 % (ref 12.0–46.0)
Lymphs Abs: 1.7 10*3/uL (ref 0.7–4.0)
MCHC: 34.3 g/dL (ref 30.0–36.0)
MCV: 91.1 fl (ref 78.0–100.0)
Monocytes Absolute: 0.7 10*3/uL (ref 0.1–1.0)
Monocytes Relative: 9.3 % (ref 3.0–12.0)
Neutro Abs: 4.9 10*3/uL (ref 1.4–7.7)
Neutrophils Relative %: 65.7 % (ref 43.0–77.0)
Platelets: 256 10*3/uL (ref 150.0–400.0)
RBC: 4.62 Mil/uL (ref 4.22–5.81)
RDW: 13 % (ref 11.5–15.5)
WBC: 7.5 10*3/uL (ref 4.0–10.5)

## 2020-06-19 MED ORDER — TRELEGY ELLIPTA 100-62.5-25 MCG/INH IN AEPB: 1.0000 | INHALATION_SPRAY | Freq: Every day | RESPIRATORY_TRACT | 0 refills | Status: DC

## 2020-06-19 NOTE — Patient Instructions (Signed)
Order- CXR  Dx Cough  Order- lab- CBC w diff, IgE  Suggest- Try otc nasal spray nasalcrom/ cromolyn- [place]ollkow directions on the box and give it a week or two  Suggest- at bedtime try otc antihistamine Clortrimeton. Its mildly sedating, but may do better at drying you up.  We can continue CPAP 13  Sample Trelegy 100   Inhale 1 puff then rinse mouth once daily

## 2020-06-20 LAB — IGE: IgE (Immunoglobulin E), Serum: 99 kU/L (ref <=114)

## 2020-06-26 ENCOUNTER — Ambulatory Visit: Payer: 59 | Admitting: Internal Medicine

## 2020-07-04 ENCOUNTER — Other Ambulatory Visit: Payer: Self-pay | Admitting: Family Medicine

## 2020-07-04 DIAGNOSIS — E782 Mixed hyperlipidemia: Secondary | ICD-10-CM

## 2020-07-04 DIAGNOSIS — Z136 Encounter for screening for cardiovascular disorders: Secondary | ICD-10-CM

## 2020-07-26 NOTE — Assessment & Plan Note (Signed)
Cough now mainly Plan- Samples Trelegy 100, CXR, consider Biologic

## 2020-07-26 NOTE — Assessment & Plan Note (Signed)
Inadequate control Plan- Try Nasalcrom, Chlortrimeton. Watch for alternative diagnosis.

## 2020-07-26 NOTE — Assessment & Plan Note (Signed)
Benefits from CPAP with good compliance and control Plan-continue CPAP 13 

## 2020-07-26 NOTE — Progress Notes (Deleted)
Virtual Visit via Video Note  I connected with [name] on 07/29/20 at 7:30 AM  by a video enabled telemedicine application and verified that I am speaking with the correct person using two identifiers.   I discussed the limitations of evaluation and management by telemedicine and the availability of in person appointments. The patient expressed understanding and agreed to proceed.  -Location of the patient : -Location of the provider : Office -The names of all persons participating in the telemedicine service : Pt and myself        Name: [name]  MRN/ DOB: [id number], 1956/04/18    Age/ Sex: 65 y.o., male     PCP: Cari Caraway, MD   Reason for Endocrinology Evaluation: Hypothyroidism     Initial Endocrinology Clinic Visit: 02/17/2019    PATIENT IDENTIFIER: [name] is a 65 y.o., male with a past medical history of HTN, OSA, PFO (S/P closure) seizure disorder, Hx of PE ( during hospitalization) and CKD He has followed with [facility] since 02/17/2019 for consultative assistance with management of his hypothyroidism   HISTORICAL SUMMARY: The patient was first diagnosed with hypothyroidism secondary to Hashimoto's thyroiditis may years ago.  He has tried levothyroxine and synthroid but developed LE neuropathy, he was then switched to armour thyroid and developed swelling after 2 months of taking it ,so he stopped all supplementation for a couple of years  with a TSH of 13.78 uIU/mL in 12/2018 with normal FT4. , we opted to start LT- 4 replacement again.    Sister/ nephew with hashimoto's thyroiditis.  SUBJECTIVE:     Today (07/26/2020):  [name] is here for is here for a follow up on hypothyroidism . He has been compliant with LT-4 replacement . He denies any side effects.   His weight has increased.   His energy level is low but he continues with insomnia , falls asleep at work  No constipation  On lexapro  for anxiety   He  has ED issues , no spontaneous erection  Brother with hx of prostate cancer     HISTORY:  Past Medical History:  Past Medical History:  Diagnosis Date  . Arthritis   . Asthma    excerise induced  . Brain abscess   . Chronic renal insufficiency, stage III (moderate) ([facility])   . DVT (deep venous thrombosis) ([place])   . History of osteomyelitis   . History of transfusion of platelets   . Hx of pulmonary embolus    x 2  . Hypertension   . Neuropathy   . Patent foramen ovale   . Seizures ([place])   . Sleep apnea    Sleep study done 5-6  years ago machine set at 10   Past Surgical History:  Past Surgical History:  Procedure Laterality Date  . ANTERIOR CRUCIATE LIGAMENT REPAIR    . BRAIN SURGERY     abscess drainage x 2  . CARPAL TUNNEL RELEASE    . CRANIOTOMY    . CRANIOTOMY  03/30/2012   Procedure: CRANIOTOMY TUMOR EXCISION;  Surgeon: Winfield Cunas, MD;  Location: [facility];  Service: Neurosurgery;  Laterality: Right;   craniotomy for intracranial mass.  . CRANIOTOMY  03/31/2012   Procedure: CRANIOTOMY TUMOR EXCISION;  Surgeon: Winfield Cunas, MD;  Location: [facility];  Service: Neurosurgery;  Laterality: Right;  Craniotomy for excision of mass  . Deep Vein    . FRACTURE SURGERY  right arm  . LAPAROSCOPIC APPENDECTOMY  11/20/2011  . LAPAROSCOPIC APPENDECTOMY  11/20/2011   Procedure: APPENDECTOMY LAPAROSCOPIC;  Surgeon: Harl Bowie, MD;  Location: [facility];  Service: General;  Laterality: N/A;  . PATENT FORAMEN OVALE CLOSURE    . Pulmonary Embolus    . ULNAR TUNNEL RELEASE      Social History:  reports that he has never smoked. He has never used smokeless tobacco. He reports that he does not drink alcohol and does not use drugs. Family History:  Family History  Problem Relation Age of Onset  . Allergies Unknown        whole family  . Asthma Unknown        whole family     HOME MEDICATIONS: Allergies as of 07/29/2020      Reactions   Iodinated Diagnostic  Agents Anaphylaxis   Iohexol Anaphylaxis, Shortness Of Breath    Code: HIVES, Desc: hives and tachycardia last time pt received IV CM kdean 07/19/06, Onset Date: [id number]   On 9/9 pt had a ct head w/ and received 13hr premedication. Was fine after scan, but broke out in hives when he got home.  Took 100mg  benedryl at home PO, and was fine.   Codeine    agitation   Dilantin [phenytoin] Hives   Heparin Hives   Nuvigil [armodafinil]    rash   Other    Follows Kosher Diet; Needs Kosher products   Flagyl [metronidazole] Rash      Medication List       Accurate as of July 26, 2020  1:47 PM. If you have any questions, ask your nurse or doctor.        Advair HFA 115-21 MCG/ACT inhaler Generic drug: fluticasone-salmeterol Advair HFA 115 mcg-21 mcg/actuation aerosol inhaler   albuterol 108 (90 Base) MCG/ACT inhaler Commonly known as: VENTOLIN HFA Inhale 2 puffs into the lungs every 6 (six) hours as needed. For shortness of breath   azelastine 0.1 % nasal spray Commonly known as: ASTELIN as needed.   escitalopram 10 MG tablet Commonly known as: LEXAPRO Take 1 tablet by mouth daily.   hydrochlorothiazide 12.5 MG capsule Commonly known as: MICROZIDE Take 1 capsule by mouth daily.   irbesartan 150 MG tablet Commonly known as: AVAPRO Take by mouth daily.   ketoconazole 2 % shampoo Commonly known as: NIZORAL as needed.   levETIRAcetam 500 MG tablet Commonly known as: KEPPRA Take 500 mg by mouth 2 (two) times daily.   levothyroxine 50 MCG tablet Commonly known as: SYNTHROID Take 1 tablet (50 mcg total) by mouth daily. 2 tablets on Saturdays and sundays, 1 tablet rest of the week   pantoprazole 40 MG tablet Commonly known as: PROTONIX Take 40 mg by mouth daily.   Trelegy Ellipta 100-62.5-25 MCG/INH Aepb Generic drug: Fluticasone-Umeclidin-Vilant Inhale 1 puff into the lungs daily.       DATA REVIEWED:  Results for [name] (MRN [id number]) as of 11/30/2019  10:58  Ref. Range 05/31/2019 11:50 11/29/2019 11:45  TSH Latest Ref Range: 0.35 - 4.50 uIU/mL 5.94 (H) 5.73 (H)  T4,Free(Direct) Latest Ref Range: 0.60 - 1.60 ng/dL 0.97 0.87     ASSESSMENT / PLAN / RECOMMENDATIONS:   1. Hypothyroidism:  - Pt with non-specific symptoms that could be attributed to hypothyroidism - Pt educated extensively on the correct way to take levothyroxine (first thing in the morning with water, 30 minutes before eating or taking other medications). - Pt encouraged to double dose the following  day if she were to miss a dose given long half-life of levothyroxine.   Medications    Levothyroxine 50 mcg, 2 tablets on Saturdays and  Sundays and 1 tablet the rest of the week       F/U in 6 months  Signed electronically by: Mack Guise, MD  [facility]  [facility] [address] [place], [place] [address] Phone: [phone] FAX: [phone]      CC: Cari Caraway, [place] [address] Phone: [phone]  Fax: [phone]   Return to Endocrinology clinic as below: Future Appointments  Date Time Provider [place]  07/29/2020  7:30 AM Hulbert Branscome, Melanie Crazier, MD [facility] None  07/30/2020 10:20 AM [facility] 1 [facility]  09/16/2020  9:00 AM Penumalli, Earlean Polka, MD [facility] None  10/21/2020  9:00 AM Deneise Lever, MD [facility] None

## 2020-07-29 ENCOUNTER — Telehealth: Payer: 59 | Admitting: Internal Medicine

## 2020-07-30 ENCOUNTER — Inpatient Hospital Stay: Admission: RE | Admit: 2020-07-30 | Payer: 59 | Source: Ambulatory Visit

## 2020-08-10 ENCOUNTER — Other Ambulatory Visit: Payer: Self-pay | Admitting: Internal Medicine

## 2020-08-21 ENCOUNTER — Ambulatory Visit
Admission: RE | Admit: 2020-08-21 | Discharge: 2020-08-21 | Disposition: A | Discharge status: Home / Self Care | Payer: 59 | Source: Ambulatory Visit | Attending: Family Medicine | Admitting: Family Medicine

## 2020-08-21 DIAGNOSIS — Z136 Encounter for screening for cardiovascular disorders: Secondary | ICD-10-CM

## 2020-08-21 DIAGNOSIS — E782 Mixed hyperlipidemia: Secondary | ICD-10-CM

## 2020-08-26 ENCOUNTER — Other Ambulatory Visit: Payer: Self-pay | Admitting: Family Medicine

## 2020-08-26 DIAGNOSIS — K7689 Other specified diseases of liver: Secondary | ICD-10-CM

## 2020-09-11 ENCOUNTER — Other Ambulatory Visit: Payer: No Typology Code available for payment source

## 2020-09-11 ENCOUNTER — Encounter: Payer: Self-pay | Admitting: *Deleted

## 2020-09-16 ENCOUNTER — Ambulatory Visit: Payer: 59 | Admitting: Diagnostic Neuroimaging

## 2020-09-18 ENCOUNTER — Other Ambulatory Visit: Payer: Self-pay

## 2020-09-18 ENCOUNTER — Ambulatory Visit
Admission: RE | Admit: 2020-09-18 | Discharge: 2020-09-18 | Disposition: A | Discharge status: Home / Self Care | Payer: 59 | Source: Ambulatory Visit | Attending: Family Medicine | Admitting: Family Medicine

## 2020-09-18 DIAGNOSIS — K7689 Other specified diseases of liver: Secondary | ICD-10-CM

## 2020-09-19 ENCOUNTER — Ambulatory Visit: Payer: Self-pay | Admitting: Cardiology

## 2020-09-30 ENCOUNTER — Encounter: Payer: Self-pay | Admitting: Cardiology

## 2020-09-30 ENCOUNTER — Other Ambulatory Visit: Payer: Self-pay

## 2020-09-30 ENCOUNTER — Ambulatory Visit: Payer: No Typology Code available for payment source | Admitting: Cardiology

## 2020-09-30 VITALS — BP 136/85 | HR 64 | Temp 97.2°F | Resp 16 | Ht 66.75 in | Wt 193.0 lb

## 2020-09-30 DIAGNOSIS — R0609 Other forms of dyspnea: Secondary | ICD-10-CM

## 2020-09-30 DIAGNOSIS — I7121 Aneurysm of the ascending aorta, without rupture: Secondary | ICD-10-CM

## 2020-09-30 DIAGNOSIS — E7800 Pure hypercholesterolemia, unspecified: Secondary | ICD-10-CM

## 2020-09-30 DIAGNOSIS — R0600 Dyspnea, unspecified: Secondary | ICD-10-CM

## 2020-09-30 DIAGNOSIS — I712 Thoracic aortic aneurysm, without rupture: Secondary | ICD-10-CM

## 2020-09-30 DIAGNOSIS — I10 Essential (primary) hypertension: Secondary | ICD-10-CM

## 2020-09-30 DIAGNOSIS — R931 Abnormal findings on diagnostic imaging of heart and coronary circulation: Secondary | ICD-10-CM

## 2020-09-30 DIAGNOSIS — N1831 Chronic kidney disease, stage 3a: Secondary | ICD-10-CM

## 2020-09-30 MED ORDER — ROSUVASTATIN CALCIUM 10 MG PO TABS: 10.0000 mg | ORAL_TABLET | Freq: Every day | ORAL | 2 refills | Status: DC

## 2020-09-30 NOTE — Progress Notes (Signed)
Primary Physician/Referring:  Cari Caraway, MD  Patient ID: [name], male    DOB: 23-Jan-1956, 65 y.o.   MRN: [id number]  Chief Complaint  Patient presents with  . Coronary Artery Disease  . New Patient (Initial Visit)    Referred by Cari Caraway, MD   HPI:    [name]  is a 65 y.o. Caucasian male with brain abscess diagnosed in 2012 for which he underwent craniotomy, has mild residual left-sided facial and arm weakness and seizures controlled with Dilantin.  He has had repeat craniotomy for the same.  During initial admission in 2012, he was also found to have a very large PFO and as the brain abscess occurred after dental work-up, paradoxical embolus was felt to be the etiology.  Also during hospital admission, patient developed severe platypnea-orthodeoxia syndrome and hence underwent successful repair of the atrial septal defect with implantation of a 28 mm CardioSEAL septal occluder on 01/11/2006.  He was also tested positive for DVT and small peripheral PE at that time.  His past medical history significant for hypertension, hyperlipidemia, history of bronchial asthma, aortic root dilatation and chronic stage IIIa kidney disease related to prior NSAID use.  Except for chronic dyspnea he has no specific complaints.  States that his activity has been markedly reduced due to degenerative joint disease, previously used to ride bike.  Denies any chest pain and had no PND or orthopnea or leg edema.  The last time he has done any cardio exercises was greater than 1 to 2 years ago and states that he has been sedentary except doing some weights.  Past Medical History:  Diagnosis Date  . ADHD   . Anxiety   . Arthritis   . Asthma    excerise induced  . Brain abscess   . Chronic renal insufficiency, stage III (moderate) (HCC)   . DVT (deep venous thrombosis) ([place])   . Esophageal reflux   . History of osteomyelitis   . History of transfusion of platelets   . Hx of  pulmonary embolus    x 2  . Hyperlipidemia   . Hypertension   . Neuropathy   . Patent foramen ovale   . Seizures ([place])   . Sleep apnea    Sleep study done 5-6  years ago machine set at 10   Past Surgical History:  Procedure Laterality Date  . ANTERIOR CRUCIATE LIGAMENT REPAIR    . BRAIN SURGERY     abscess drainage x 2  . CARPAL TUNNEL RELEASE    . CRANIOTOMY    . CRANIOTOMY  03/30/2012   Procedure: CRANIOTOMY TUMOR EXCISION;  Surgeon: Winfield Cunas, MD;  Location: [facility] [facility];  Service: Neurosurgery;  Laterality: Right;   craniotomy for intracranial mass.  . CRANIOTOMY  03/31/2012   Procedure: CRANIOTOMY TUMOR EXCISION;  Surgeon: Winfield Cunas, MD;  Location: [place];  Service: Neurosurgery;  Laterality: Right;  Craniotomy for excision of mass  . Deep Vein    . FRACTURE SURGERY     right arm  . LAPAROSCOPIC APPENDECTOMY  11/20/2011  . LAPAROSCOPIC APPENDECTOMY  11/20/2011   Procedure: APPENDECTOMY LAPAROSCOPIC;  Surgeon: Harl Bowie, MD;  Location: [facility];  Service: General;  Laterality: N/A;  . PATENT FORAMEN OVALE CLOSURE    . Pulmonary Embolus    . ULNAR TUNNEL RELEASE     Family History  Problem Relation Age of Onset  . Bladder Cancer Mother   . Stroke Father   .  Diabetes Brother   . Thyroid disease Sister   . Allergies Other        whole family  . Asthma Other        whole family    Social History   Tobacco Use  . Smoking status: Never Smoker  . Smokeless tobacco: Never Used  Substance Use Topics  . Alcohol use: No    Comment: quit 10 years ago   Marital Status: Married  ROS  Review of Systems  Cardiovascular: Positive for dyspnea on exertion. Negative for chest pain and leg swelling.  Respiratory: Positive for wheezing.   Musculoskeletal: Positive for arthritis.  Gastrointestinal: Negative for melena.   Objective  Blood pressure 136/85, pulse 64, temperature (!) 97.2 F (36.2 C), temperature source Temporal, resp. rate 16, height 5' 6.75"  (1.695 m), weight 193 lb (87.5 kg), SpO2 97 %.  Vitals with BMI 09/30/2020 06/19/2020 11/29/2019  Height 5' 6.75" 5' 6.75" 5' 7"   Weight 193 lbs 189 lbs 10 oz 190 lbs  BMI 30.47 49.44 96.75  Systolic 916 384 665  Diastolic 85 78 68  Pulse 64 83 80     Physical Exam Cardiovascular:     Rate and Rhythm: Normal rate and regular rhythm.     Pulses: Intact distal pulses.     Heart sounds: Normal heart sounds. No murmur heard. No gallop.      Comments: No leg edema, no JVD. Pulmonary:     Effort: Pulmonary effort is normal.     Breath sounds: Normal breath sounds.  Abdominal:     General: Bowel sounds are normal.     Palpations: Abdomen is soft.    Laboratory examination:   No results for input(s): NA, K, CL, CO2, GLUCOSE, BUN, CREATININE, CALCIUM, GFRNONAA, GFRAA in the last 8760 hours. CrCl cannot be calculated (Patient's most recent lab result is older than the maximum 21 days allowed.).  CMP Latest Ref Rng & Units 08/22/2012 03/31/2012 03/22/2012  Glucose 70 - 99 mg/dL 89 123(H) 108(H)  BUN 6 - 23 mg/dL 24(H) - 34(H)  Creatinine 0.50 - 1.35 mg/dL 1.50(H) - 1.46(H)  Sodium 135 - 145 mEq/L 139 142 141  Potassium 3.5 - 5.1 mEq/L 3.9 4.0 3.7  Chloride 96 - 112 mEq/L 103 - 102  CO2 19 - 32 mEq/L - - 29  Calcium 8.4 - 10.5 mg/dL - - 9.9  Total Protein 6.0 - 8.3 g/dL - - -  Total Bilirubin 0.3 - 1.2 mg/dL - - -  Alkaline Phos 39 - 117 U/L - - -  AST 0 - 37 U/L - - -  ALT 0 - 53 U/L - - -   CBC Latest Ref Rng & Units 06/19/2020 08/22/2012 03/31/2012  WBC 4.0 - 10.5 K/uL 7.5 - -  Hemoglobin 13.0 - 17.0 g/dL 14.4 15.0 11.9(L)  Hematocrit 39.0 - 52.0 % 42.1 44.0 35.0(L)  Platelets 150.0 - 400.0 K/uL 256.0 - -    Lipid Panel No results for input(s): CHOL, TRIG, LDLCALC, VLDL, HDL, CHOLHDL, LDLDIRECT in the last 8760 hours. Lipid Panel  No results found for: CHOL, TRIG, HDL, CHOLHDL, VLDL, LDLCALC, LDLDIRECT, LABVLDL   TSH Recent Labs    11/29/19 1145  TSH 5.73*   External labs:    Labs 02/14/2020:  Hb 15.2/HCT 43.5, platelets 249.  Serum glucose 83 mg, BUN 23, creatinine 1.37, potassium 3.9, CMP otherwise normal.  TSH normal.  Total cholesterol 226, triglycerides 140, HDL 42, LDL 158.  Medications and allergies  Allergies  Allergen Reactions  . Iodinated Diagnostic Agents Anaphylaxis  . Iohexol Anaphylaxis and Shortness Of Breath     Code: HIVES, Desc: hives and tachycardia last time pt received IV CM kdean 07/19/06, Onset Date: [id number]   On 9/9 pt had a ct head w/ and received 13hr premedication. Was fine after scan, but broke out in hives when he got home.  Took 16m benedryl at home PO, and was fine.   . Ceftriaxone Other (See Comments)    rash  . Codeine     agitation  . Dilantin [Phenytoin] Hives  . Heparin Hives  . Hydrocodone     Other reaction(s): agitation  . Nuvigil [Armodafinil]     rash  . Other     [place] Needs Kosher products  . Thyroid     Other reaction(s): mouth swelling  . Flagyl [Metronidazole] Rash    Outpatient Medications Prior to Visit  Medication Sig  . albuterol (PROVENTIL HFA;VENTOLIN HFA) 108 (90 BASE) MCG/ACT inhaler Inhale 2 puffs into the lungs every 6 (six) hours as needed. For shortness of breath  . azelastine (ASTELIN) 0.1 % nasal spray as needed.   .Marland Kitchenescitalopram (LEXAPRO) 10 MG tablet Take 1 tablet by mouth daily.  . fluticasone-salmeterol (ADVAIR HFA) 115-21 MCG/ACT inhaler Advair HFA 115 mcg-21 mcg/actuation aerosol inhaler  . hydrochlorothiazide (MICROZIDE) 12.5 MG capsule Take 1 capsule by mouth daily.  . irbesartan (AVAPRO) 150 MG tablet Take by mouth daily.  .Marland Kitchenketoconazole (NIZORAL) 2 % shampoo as needed.  . levETIRAcetam (KEPPRA) 500 MG tablet Take 500 mg by mouth 2 (two) times daily.   .Marland Kitchenlevothyroxine (SYNTHROID) 50 MCG tablet TAKE 1 TABLET BY MOUTH  DAILY  . pantoprazole (PROTONIX) 40 MG tablet Take 40 mg by mouth daily.  . fexofenadine (ALLEGRA ALLERGY) 180 MG tablet Take 1 tablet by  mouth daily as needed.  . [DISCONTINUED] Fluticasone-Umeclidin-Vilant (TRELEGY ELLIPTA) 100-62.5-25 MCG/INH AEPB Inhale 1 puff into the lungs daily.   No facility-administered medications prior to visit.    Radiology:   Coronary calcium score 08/21/2020: Total Agatston score 16.4, MeSA database percentile 34. Left main: 0; LAD: 0; LCx: 16.4; RCA: 0. Ascending aorta is dilated at 42 mm.  Ultrasound of the abdomen 09/18/2020: 1. Multiple hepatic cysts, largest measuring 7.0 cm in the right hepatic lobe. 2. Two small 1.2 cm and 0.9 cm round hyperechoic lesions in the right hepatic lobe, probably hemangiomas, benign. If definitive characterization is required, consider follow-up MRI abdomen without and with contrast in 6 months. 3. Low-density lesions throughout the liver upper measuring up to 6 cm, favor cysts, but these cannot be fully characterized on this noncontrast study.   Cardiac Studies:   ASD repair 01/11/2006: 1.  Intracardiac echocardiogram.  2.  Closure of the PFO with 28 mm CardioSeal septal occluder.   EKG:     EKG 09/30/2020: Normal sinus rhythm with rate of 65 bpm, normal axis, poor R wave progression, cannot exclude anteroseptal infarct old.  Low-voltage complexes.     Assessment     ICD-10-CM   1. Agatston coronary artery calcium score less than 100  R93.1 PCV CARDIAC STRESS TEST  2. Hypercholesteremia  E78.00 rosuvastatin (CRESTOR) 10 MG tablet    Lipid Panel With LDL/HDL Ratio    Lipid Panel With LDL/HDL Ratio  3. Primary hypertension  I10 EKG 12-Lead    CMP14+EGFR    CMP14+EGFR  4. Dyspnea on exertion  R06.00 PCV ECHOCARDIOGRAM COMPLETE    PCV CARDIAC  STRESS TEST  5. Ascending aortic aneurysm (HCC)  I71.2   6. Stage 3a chronic kidney disease (HCC)  N18.31     Medications Discontinued During This Encounter  Medication Reason  . Fluticasone-Umeclidin-Vilant (TRELEGY ELLIPTA) 100-62.5-25 MCG/INH AEPB Error    Meds ordered this encounter  Medications  .  rosuvastatin (CRESTOR) 10 MG tablet    Sig: Take 1 tablet (10 mg total) by mouth daily.    Dispense:  30 tablet    Refill:  2   Orders Placed This Encounter  Procedures  . Lipid Panel With LDL/HDL Ratio    Standing Status:   Future    Number of Occurrences:   1    Standing Expiration Date:   09/30/2021  . CMP14+EGFR    Standing Status:   Future    Number of Occurrences:   1    Standing Expiration Date:   09/30/2021  . PCV CARDIAC STRESS TEST    Standing Status:   Future    Standing Expiration Date:   11/30/2020  . EKG 12-Lead  . PCV ECHOCARDIOGRAM COMPLETE    Standing Status:   Future    Standing Expiration Date:   09/30/2021   Recommendations:   SHAFTER JUPIN is a 65 y.o. Caucasian male with brain abscess diagnosed in 2012 for which he underwent craniotomy, has mild residual left-sided facial and arm weakness and seizures controlled with Dilantin.  He has had repeat craniotomy for the same.  During initial admission in 2012, he was also found to have a very large PFO and as the brain abscess occurred after dental work-up, paradoxical embolus was felt to be the etiology.  Also during hospital admission, patient developed severe platypnea-orthodeoxia syndrome and hence underwent successful repair of the atrial septal defect with implantation of a 28 mm CardioSEAL septal occluder on 01/11/2006.  He was also tested positive for DVT and small peripheral PE at that time.  His past medical history significant for hypertension, hyperlipidemia, history of bronchial asthma, aortic root dilatation and chronic stage IIIa kidney disease related to prior NSAID use.  He now presents to establish cardiac care, wanted to discuss with me regarding coronary calcium score and cardiac recertification.  I reviewed his external labs, also coronary CT score for calcium.  He falls on the 37th percentile.  I would recommend statin therapy, patient willing to start low-dose, Crestor 10 mg started.  I have discussed  with him that the serum creatinine elevation with statins is a function of transporter protein leading to decreased clearance of the creatinine and not necessarily a direct effect on the renal tissue.  He also has ascending aortic aneurysm.  He has not had any work-up regarding this.  In view of dyspnea I have recommended a routine treadmill stress test along with an echocardiogram.  Otherwise blood pressure is well controlled and he is on appropriate medical therapy.  I will see him back in 3 months with repeat lipid profile testing.  Could consider either increasing Crestor to 20 mg which I suspect he will need or addition of Zetia.    Adrian Prows, MD, [facility] 09/30/2020, 11:32 AM Office: [phone]

## 2020-09-30 NOTE — Patient Instructions (Signed)
Will be scheduled for a simple routine treadmill stress test and an echocardiogram.  In last week of May 2022, please go to [facility] and have labs done on a fasting state after fasting for at least 6 hours.

## 2020-10-07 ENCOUNTER — Ambulatory Visit: Payer: No Typology Code available for payment source

## 2020-10-07 ENCOUNTER — Other Ambulatory Visit: Payer: Self-pay

## 2020-10-07 DIAGNOSIS — R0600 Dyspnea, unspecified: Secondary | ICD-10-CM

## 2020-10-07 DIAGNOSIS — R931 Abnormal findings on diagnostic imaging of heart and coronary circulation: Secondary | ICD-10-CM

## 2020-10-07 DIAGNOSIS — R0609 Other forms of dyspnea: Secondary | ICD-10-CM

## 2020-10-08 NOTE — Telephone Encounter (Signed)
From patient.

## 2020-10-09 ENCOUNTER — Other Ambulatory Visit: Payer: Self-pay | Admitting: Family Medicine

## 2020-10-09 DIAGNOSIS — K7689 Other specified diseases of liver: Secondary | ICD-10-CM

## 2020-10-10 ENCOUNTER — Other Ambulatory Visit: Payer: 59

## 2020-10-11 ENCOUNTER — Ambulatory Visit: Payer: No Typology Code available for payment source

## 2020-10-11 ENCOUNTER — Other Ambulatory Visit: Payer: Self-pay

## 2020-10-11 DIAGNOSIS — R0609 Other forms of dyspnea: Secondary | ICD-10-CM

## 2020-10-11 DIAGNOSIS — R0600 Dyspnea, unspecified: Secondary | ICD-10-CM

## 2020-10-15 NOTE — Progress Notes (Signed)
Normal LVEF, aortic root dilatation is stable.  Results have been discussed with patient.  We will continue to monitor this either annually or every other year.

## 2020-10-21 ENCOUNTER — Ambulatory Visit: Payer: 59 | Admitting: Internal Medicine

## 2020-12-05 ENCOUNTER — Other Ambulatory Visit: Payer: Self-pay | Admitting: Internal Medicine

## 2020-12-10 ENCOUNTER — Ambulatory Visit: Payer: 59 | Admitting: Cardiology

## 2021-01-01 ENCOUNTER — Other Ambulatory Visit: Payer: Self-pay

## 2021-01-01 DIAGNOSIS — E7800 Pure hypercholesterolemia, unspecified: Secondary | ICD-10-CM

## 2021-01-01 MED ORDER — ROSUVASTATIN CALCIUM 10 MG PO TABS: 10.0000 mg | ORAL_TABLET | Freq: Every day | ORAL | 2 refills | Status: DC

## 2021-01-09 ENCOUNTER — Ambulatory Visit: Payer: 59 | Admitting: Cardiology

## 2021-02-07 ENCOUNTER — Ambulatory Visit: Payer: 59 | Admitting: Cardiology

## 2021-02-12 LAB — CMP14+EGFR
ALT: 21 IU/L (ref 0–44)
AST: 23 IU/L (ref 0–40)
Albumin/Globulin Ratio: 2.6 — ABNORMAL HIGH (ref 1.2–2.2)
Albumin: 5 g/dL — ABNORMAL HIGH (ref 3.8–4.8)
Alkaline Phosphatase: 69 IU/L (ref 44–121)
BUN/Creatinine Ratio: 18 (ref 10–24)
BUN: 26 mg/dL (ref 8–27)
Bilirubin Total: 0.5 mg/dL (ref 0.0–1.2)
CO2: 24 mmol/L (ref 20–29)
Calcium: 10 mg/dL (ref 8.6–10.2)
Chloride: 103 mmol/L (ref 96–106)
Creatinine, Ser: 1.45 mg/dL — ABNORMAL HIGH (ref 0.76–1.27)
Globulin, Total: 1.9 g/dL (ref 1.5–4.5)
Glucose: 91 mg/dL (ref 65–99)
Potassium: 4.1 mmol/L (ref 3.5–5.2)
Sodium: 145 mmol/L — ABNORMAL HIGH (ref 134–144)
Total Protein: 6.9 g/dL (ref 6.0–8.5)
eGFR: 54 mL/min/{1.73_m2} — ABNORMAL LOW (ref >59)

## 2021-02-12 LAB — LIPID PANEL WITH LDL/HDL RATIO
Cholesterol, Total: 127 mg/dL (ref 100–199)
HDL: 51 mg/dL (ref >39)
LDL Chol Calc (NIH): 59 mg/dL (ref 0–99)
LDL/HDL Ratio: 1.2 ratio (ref 0.0–3.6)
Triglycerides: 87 mg/dL (ref 0–149)
VLDL Cholesterol Cal: 17 mg/dL (ref 5–40)

## 2021-02-17 ENCOUNTER — Other Ambulatory Visit: Payer: Self-pay

## 2021-02-17 ENCOUNTER — Encounter: Payer: Self-pay | Admitting: Cardiology

## 2021-02-17 ENCOUNTER — Ambulatory Visit: Payer: 59 | Admitting: Cardiology

## 2021-02-17 VITALS — BP 140/100 | HR 77 | Temp 98.2°F | Ht 66.75 in | Wt 195.0 lb

## 2021-02-17 DIAGNOSIS — R931 Abnormal findings on diagnostic imaging of heart and coronary circulation: Secondary | ICD-10-CM

## 2021-02-17 DIAGNOSIS — I77810 Thoracic aortic ectasia: Secondary | ICD-10-CM

## 2021-02-17 DIAGNOSIS — I10 Essential (primary) hypertension: Secondary | ICD-10-CM

## 2021-02-17 DIAGNOSIS — E7800 Pure hypercholesterolemia, unspecified: Secondary | ICD-10-CM

## 2021-02-17 NOTE — Progress Notes (Signed)
Primary Physician/Referring:  Cari Caraway, MD  Patient ID: DE KOPKA, male    DOB: 1956/03/10, 65 y.o.   MRN: [id number]  Chief Complaint  Patient presents with   Hypertension   Follow-up   HPI:    [name]  is a 65 y.o. Caucasian male with brain abscess diagnosed in 2012 for which he underwent craniotomy, has mild residual left-sided facial and arm weakness and seizures controlled with Dilantin.  He has had repeat craniotomy for the same.  During initial admission in 2012, he was also found to have a very large PFO and as the brain abscess occurred after dental work-up, paradoxical embolus was felt to be the etiology.  Also during hospital admission, patient developed severe platypnea-orthodeoxia syndrome and hence underwent successful repair of the atrial septal defect with implantation of a 28 mm CardioSEAL septal occluder on 01/11/2006.  He was also tested positive for DVT and small peripheral PE at that time.  His past medical history significant for hypertension, hyperlipidemia, history of bronchial asthma, aortic root dilatation and chronic stage IIIa kidney disease related to prior NSAID use.  Except for chronic dyspnea he has no specific complaints.  He has been sedentary except doing some weights.  On his last office visit, due to elevated coronary calcium score I started him on Crestor and also due to aortic root dilatation and ascending aortic dilatation, repeated an echocardiogram and he now presents for follow-up.  He also underwent routine treadmill stress test for evaluation of dyspnea.  No new symptomatology.  Past Medical History:  Diagnosis Date   ADHD    Anxiety    Arthritis    Asthma    excerise induced   Brain abscess    Chronic renal insufficiency, stage III (moderate) (HCC)    DVT (deep venous thrombosis) (HCC)    Esophageal reflux    History of osteomyelitis    History of transfusion of platelets    Hx of pulmonary embolus    x 2    Hyperlipidemia    Hypertension    Neuropathy    Patent foramen ovale    Seizures (HCC)    Sleep apnea    Sleep study done 5-6  years ago machine set at 10   Past Surgical History:  Procedure Laterality Date   ANTERIOR CRUCIATE LIGAMENT REPAIR     BRAIN SURGERY     abscess drainage x 2   CARPAL TUNNEL RELEASE     CRANIOTOMY     CRANIOTOMY  03/30/2012   Procedure: CRANIOTOMY TUMOR EXCISION;  Surgeon: Winfield Cunas, MD;  Location: [facility];  Service: Neurosurgery;  Laterality: Right;   craniotomy for intracranial mass.   CRANIOTOMY  03/31/2012   Procedure: CRANIOTOMY TUMOR EXCISION;  Surgeon: Winfield Cunas, MD;  Location: [facility];  Service: Neurosurgery;  Laterality: Right;  Craniotomy for excision of mass   Deep Vein     FRACTURE SURGERY     right arm   LAPAROSCOPIC APPENDECTOMY  11/20/2011   LAPAROSCOPIC APPENDECTOMY  11/20/2011   Procedure: APPENDECTOMY LAPAROSCOPIC;  Surgeon: Harl Bowie, MD;  Location: [facility];  Service: General;  Laterality: N/A;   PATENT FORAMEN OVALE CLOSURE     Pulmonary Embolus     ULNAR TUNNEL RELEASE     Family History  Problem Relation Age of Onset   Bladder Cancer Mother    Stroke Father    Diabetes Brother    Thyroid disease Sister  Allergies Other        whole family   Asthma Other        whole family    Social History   Tobacco Use   Smoking status: Never   Smokeless tobacco: Never  Substance Use Topics   Alcohol use: No    Comment: quit 10 years ago   Marital Status: Married  ROS  Review of Systems  Cardiovascular:  Positive for dyspnea on exertion. Negative for chest pain and leg swelling.  Respiratory:  Positive for wheezing.   Musculoskeletal:  Positive for arthritis.  Gastrointestinal:  Negative for melena.  Objective  Blood pressure (!) 140/100, pulse 77, temperature 98.2 F (36.8 C), height 5' 6.75" (1.695 m), weight 195 lb (88.5 kg), SpO2 96 %.  Vitals with BMI 02/17/2021 02/17/2021 09/30/2020  Height - 5'  6.75" 5' 6.75"  Weight - 195 lbs 193 lbs  BMI - 123456 [id number]  Systolic [id number] 0000000 [id number]  Diastolic [id number] [id number] 85  Pulse - 77 64     Physical Exam Cardiovascular:     Rate and Rhythm: Normal rate and regular rhythm.     Pulses: Intact distal pulses.     Heart sounds: Normal heart sounds. No murmur heard.   No gallop.     Comments: No leg edema, no JVD. Pulmonary:     Effort: Pulmonary effort is normal.     Breath sounds: Normal breath sounds.  Abdominal:     General: Bowel sounds are normal.     Palpations: Abdomen is soft.   Laboratory examination:   Recent Labs    02/11/21 0839  NA 145*  K 4.1  CL 103  CO2 24  GLUCOSE 91  BUN 26  CREATININE 1.45*  CALCIUM 10.0   estimated creatinine clearance is 54.4 mL/min (A) (by C-G formula based on SCr of 1.45 mg/dL (H)).  CMP Latest Ref Rng & Units 02/11/2021 08/22/2012 03/31/2012  Glucose 65 - 99 mg/dL 91 89 123(H)  BUN 8 - 27 mg/dL 26 24(H) -  Creatinine 0.76 - 1.27 mg/dL 1.45(H) 1.50(H) -  Sodium 134 - 144 mmol/L 145(H) 139 142  Potassium 3.5 - 5.2 mmol/L 4.1 3.9 4.0  Chloride 96 - 106 mmol/L 103 103 -  CO2 20 - 29 mmol/L 24 - -  Calcium 8.6 - 10.2 mg/dL 10.0 - -  Total Protein 6.0 - 8.5 g/dL 6.9 - -  Total Bilirubin 0.0 - 1.2 mg/dL 0.5 - -  Alkaline Phos 44 - 121 IU/L 69 - -  AST 0 - 40 IU/L 23 - -  ALT 0 - 44 IU/L 21 - -   CBC Latest Ref Rng & Units 06/19/2020 08/22/2012 03/31/2012  WBC 4.0 - 10.5 K/uL 7.5 - -  Hemoglobin 13.0 - 17.0 g/dL 14.4 15.0 11.9(L)  Hematocrit 39.0 - 52.0 % 42.1 44.0 35.0(L)  Platelets 150.0 - 400.0 K/uL 256.0 - -    Lipid Panel Recent Labs    02/11/21 0838  CHOL 127  TRIG 87  LDLCALC 59  HDL 51   Lipid Panel     Component Value Date/Time   CHOL 127 02/11/2021 0838   TRIG 87 02/11/2021 0838   HDL 51 02/11/2021 0838   LDLCALC 59 02/11/2021 0838   LABVLDL 17 02/11/2021 0838     TSH No results for input(s): TSH in the last 8760 hours.  External labs:   Labs 02/14/2020:  Hb 15.2/HCT  43.5, platelets 249.  Serum glucose 83 mg, BUN 23, creatinine  1.37, potassium 3.9, CMP otherwise normal.  TSH normal.  Total cholesterol 226, triglycerides 140, HDL 42, LDL 158.  Medications and allergies   Allergies  Allergen Reactions   Iodinated Diagnostic Agents Anaphylaxis   Iohexol Anaphylaxis and Shortness Of Breath     Code: HIVES, Desc: hives and tachycardia last time pt received IV CM kdean 07/19/06, Onset Date: [id number]   On 9/9 pt had a ct head w/ and received 13hr premedication. Was fine after scan, but broke out in hives when he got home.  Took '100mg'$  benedryl at home PO, and was fine.    Ceftriaxone Other (See Comments)    rash   Codeine     agitation   Dilantin [Phenytoin] Hives   Heparin Hives   Hydrocodone     Other reaction(s): agitation   Nuvigil [Armodafinil]     rash   Other     Follows Kosher Diet; Needs Kosher products   Thyroid     Other reaction(s): mouth swelling   Flagyl [Metronidazole] Rash    Outpatient Medications Prior to Visit  Medication Sig   albuterol (PROVENTIL HFA;VENTOLIN HFA) 108 (90 BASE) MCG/ACT inhaler Inhale 2 puffs into the lungs every 6 (six) hours as needed. For shortness of breath   azelastine (ASTELIN) 0.1 % nasal spray as needed.    escitalopram (LEXAPRO) 10 MG tablet Take 1 tablet by mouth daily.   fexofenadine (ALLEGRA) 180 MG tablet Take 1 tablet by mouth daily as needed.   fluticasone-salmeterol (ADVAIR HFA) 115-21 MCG/ACT inhaler Advair HFA 115 mcg-21 mcg/actuation aerosol inhaler   hydrochlorothiazide (MICROZIDE) 12.5 MG capsule Take 1 capsule by mouth daily.   irbesartan (AVAPRO) 150 MG tablet Take by mouth daily.   ketoconazole (NIZORAL) 2 % shampoo as needed.   levETIRAcetam (KEPPRA) 500 MG tablet Take 500 mg by mouth 2 (two) times daily.    levothyroxine (SYNTHROID) 50 MCG tablet TAKE 1 TABLET BY MOUTH  DAILY   pantoprazole (PROTONIX) 40 MG tablet Take 40 mg by mouth daily.   rosuvastatin (CRESTOR) 10 MG tablet  Take 1 tablet (10 mg total) by mouth daily.   No facility-administered medications prior to visit.    Radiology:   Ultrasound of the abdomen 09/18/2020: 1. Multiple hepatic cysts, largest measuring 7.0 cm in the right hepatic lobe. 2. Two small 1.2 cm and 0.9 cm round hyperechoic lesions in the right hepatic lobe, probably hemangiomas, benign. If definitive characterization is required, consider follow-up MRI abdomen without and with contrast in 6 months. 3. Low-density lesions throughout the liver upper measuring up to 6 cm, favor cysts, but these cannot be fully characterized on this noncontrast study.   Cardiac Studies:   ASD repair 01/11/2006: 1.  Intracardiac echocardiogram. 2.  Closure of the PFO with 28 mm CardioSeal septal occluder.  Coronary calcium score 08/21/2020: Total Agatston score 16.4, MeSA database percentile 34. Left main: 0; LAD: 0; LCx: 16.4; RCA: 0. Ascending aorta is dilated at 42 mm.  PCV ECHOCARDIOGRAM COMPLETE 10/11/2020 Left ventricle cavity is normal in size. Mild concentric hypertrophy of the left ventricle. Normal global wall motion. Normal LV systolic function with EF 55%. Doppler evidence of grade I (impaired) diastolic dysfunction, normal LAP. Calculated EF 55%. The aortic root is dilated. Prox ascending aorta measures 4 cm. Trileaflet aortic valve.  Moderate (Grade II) aortic regurgitation. Mild tricuspid regurgitation. No evidence of pulmonary hypertension. Aortic root dilatation, aortic regurgitation, incidental finding of liver cyst new since previous study in 2013.    PCV CARDIAC  STRESS TEST 10/07/2020 Functional status: Fair Chest pain: No. Reason for stopping exercise: Fatigue/weakness. Hypertensive response to exercise: No. Exercise time 6 minutes 47 seconds on Bruce protocol, achieved 8.25 METS, and 90% of age-predicted maximum heart rate. Stress ECG negative for ischemia. Low risk study. No prior studies available for comparison.   EKG:     EKG 09/30/2020: Normal sinus rhythm with rate of 65 bpm, normal axis, poor R wave progression, cannot exclude anteroseptal infarct old.  Low-voltage complexes.     Assessment     ICD-10-CM   1. Agatston coronary artery calcium score less than 100  R93.1     2. Hypercholesteremia  E78.00     3. Primary hypertension  I10     4. Aortic root dilatation (HCC)  I77.810 PCV ECHOCARDIOGRAM COMPLETE      There are no discontinued medications.   No orders of the defined types were placed in this encounter.  Orders Placed This Encounter  Procedures   PCV ECHOCARDIOGRAM COMPLETE    Standing Status:   Future    Standing Expiration Date:   02/17/2022    Recommendations:   [name] is a 65 y.o. Caucasian male with brain abscess diagnosed in 2012 for which he underwent craniotomy, has mild residual left-sided facial and arm weakness and seizures controlled with Dilantin.  He has had repeat craniotomy for the same.  During initial admission in 2012, he was also found to have a very large PFO and as the brain abscess occurred after dental work-up, paradoxical embolus was felt to be the etiology.  Also during hospital admission, patient developed severe platypnea-orthodeoxia syndrome and hence underwent successful repair of the atrial septal defect with implantation of a 28 mm CardioSEAL septal occluder on 01/11/2006.  He was also tested positive for DVT and small peripheral PE at that time.  His past medical history significant for hypertension, hyperlipidemia, history of bronchial asthma, aortic root dilatation and chronic stage IIIa kidney disease related to prior NSAID use.  After I seen him, due to elevated coronary calcium score, I advised him to start Crestor which he is tolerating, with excellent improvement in his lipid profile.  Today's blood pressure was markedly elevated.  However patient's blood pressure has been well controlled previously, he does not want me to make any changes  to his medications, yesterday he had fasting for religious reasons and states that he ate smoked salmon, whole packet and ate salty food leading to elevated blood pressure.  He also states that he has an appointment to see Dr. Theadore Nan very soon for complete physical examination and if the blood pressure is elevated he would consider addition of a different medication at that time.  With regard to aortic root dilatation, fortunately the aortic root measurements fairly coincide with echocardiogram and also radiology.  I will reset repeat echocardiogram in 1 year for follow-up of the same.  He is presently on an ARB that is Avapro continue the same.  His dyspnea is related to underlying reactive airway disease.  Treadmill stress test does not reveal any evidence of ischemia.  Continue primary prevention.  I will see him back in a year.   Adrian Prows, MD, [facility] 02/17/2021, 12:35 PM Office: [phone]

## 2021-03-03 ENCOUNTER — Other Ambulatory Visit: Payer: Self-pay | Admitting: Cardiology

## 2021-03-03 DIAGNOSIS — E7800 Pure hypercholesterolemia, unspecified: Secondary | ICD-10-CM

## 2021-03-18 ENCOUNTER — Other Ambulatory Visit: Payer: Self-pay

## 2021-03-18 ENCOUNTER — Ambulatory Visit
Admission: RE | Admit: 2021-03-18 | Discharge: 2021-03-18 | Disposition: A | Discharge status: Home / Self Care | Payer: No Typology Code available for payment source | Source: Ambulatory Visit | Attending: Family Medicine | Admitting: Family Medicine

## 2021-03-18 DIAGNOSIS — K7689 Other specified diseases of liver: Secondary | ICD-10-CM

## 2021-03-18 MED ORDER — GADOBENATE DIMEGLUMINE 529 MG/ML IV SOLN
16.0000 mL | Freq: Once | INTRAVENOUS | Status: AC | PRN
  Administered 2021-03-18: 16 mL via INTRAVENOUS

## 2021-10-17 ENCOUNTER — Other Ambulatory Visit: Payer: Self-pay | Admitting: Internal Medicine

## 2021-10-21 ENCOUNTER — Other Ambulatory Visit: Payer: Self-pay | Admitting: Internal Medicine

## 2021-11-10 ENCOUNTER — Other Ambulatory Visit: Payer: Self-pay | Admitting: Family Medicine

## 2021-11-10 DIAGNOSIS — I712 Thoracic aortic aneurysm, without rupture, unspecified: Secondary | ICD-10-CM

## 2021-12-10 ENCOUNTER — Other Ambulatory Visit (HOSPITAL_COMMUNITY): Payer: Self-pay | Admitting: Family Medicine

## 2021-12-10 DIAGNOSIS — I712 Thoracic aortic aneurysm, without rupture, unspecified: Secondary | ICD-10-CM

## 2021-12-12 ENCOUNTER — Inpatient Hospital Stay: Admission: RE | Admit: 2021-12-12 | Payer: No Typology Code available for payment source | Source: Ambulatory Visit

## 2021-12-24 ENCOUNTER — Ambulatory Visit (HOSPITAL_COMMUNITY)
Admission: RE | Admit: 2021-12-24 | Discharge: 2021-12-24 | Disposition: A | Discharge status: Home / Self Care | Payer: 59 | Source: Ambulatory Visit | Attending: Family Medicine | Admitting: Family Medicine

## 2021-12-24 DIAGNOSIS — I712 Thoracic aortic aneurysm, without rupture, unspecified: Secondary | ICD-10-CM | POA: Diagnosis present

## 2022-02-04 ENCOUNTER — Ambulatory Visit: Payer: 59

## 2022-02-04 DIAGNOSIS — I77810 Thoracic aortic ectasia: Secondary | ICD-10-CM

## 2022-02-06 ENCOUNTER — Encounter: Payer: Self-pay | Admitting: Cardiology

## 2022-02-09 NOTE — Telephone Encounter (Signed)
From pt

## 2022-02-17 ENCOUNTER — Other Ambulatory Visit: Payer: No Typology Code available for payment source

## 2022-02-26 ENCOUNTER — Ambulatory Visit: Payer: No Typology Code available for payment source | Admitting: Cardiology

## 2022-02-26 ENCOUNTER — Encounter: Payer: Self-pay | Admitting: Cardiology

## 2022-02-26 VITALS — BP 155/79 | HR 80 | Temp 98.3°F | Resp 16 | Ht 66.5 in | Wt 189.2 lb

## 2022-02-26 DIAGNOSIS — I10 Essential (primary) hypertension: Secondary | ICD-10-CM

## 2022-02-26 DIAGNOSIS — E785 Hyperlipidemia, unspecified: Secondary | ICD-10-CM

## 2022-02-26 DIAGNOSIS — N1831 Chronic kidney disease, stage 3a: Secondary | ICD-10-CM

## 2022-02-26 DIAGNOSIS — I77810 Thoracic aortic ectasia: Secondary | ICD-10-CM

## 2022-02-26 DIAGNOSIS — I351 Nonrheumatic aortic (valve) insufficiency: Secondary | ICD-10-CM

## 2022-02-26 DIAGNOSIS — I426 Alcoholic cardiomyopathy: Secondary | ICD-10-CM

## 2022-02-26 DIAGNOSIS — I428 Other cardiomyopathies: Secondary | ICD-10-CM

## 2022-02-26 NOTE — Progress Notes (Signed)
Primary Physician/Referring:  Cari Caraway, MD  Patient ID: [name], male    DOB: Oct 08, 1955, 66 y.o.   MRN: [id number]  Chief Complaint  Patient presents with   Hypertension   Follow-up    1 year   HPI:    [name]  is a 66 y.o. Caucasian male with brain abscess diagnosed in 2012 for which he underwent craniotomy, has mild residual left-sided facial and arm weakness and seizures controlled with Kepra.  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  Also during hospital admission, patient developed severe platypnea-orthodeoxia syndrome and hence underwent successful repair of the atrial septal defect with implantation of a 28 mm CardioSEAL septal occluder on 01/11/2006.  He was also tested positive for DVT and small peripheral PE at that time.    His past medical history significant for hypertension, hyperlipidemia, history of bronchial asthma, aortic root dilatation and chronic stage IIIa kidney disease related to prior NSAID use.  This is a annual visit and follow-up of aortic root dilatation and aortic regurgitation.    Except for chronic dyspnea he has no specific complaints.  He is concerned about the recent echo findings.  No PND or orthopnea. No chest pain or syncope.  Past Medical History:  Diagnosis Date   ADHD    Anxiety    Arthritis    Asthma    excerise induced   Brain abscess    Chronic renal insufficiency, stage III (moderate) (HCC)    DVT (deep venous thrombosis) (HCC)    Esophageal reflux    History of osteomyelitis    History of transfusion of platelets    Hx of pulmonary embolus    x 2   Hyperlipidemia    Hypertension    Neuropathy    Patent foramen ovale    Seizures (HCC)    Sleep apnea    Sleep study done 5-6  years ago machine set at 10   Past Surgical History:  Procedure Laterality Date   ANTERIOR CRUCIATE LIGAMENT REPAIR     BRAIN  SURGERY     abscess drainage x 2   CARPAL TUNNEL RELEASE     CRANIOTOMY     CRANIOTOMY  03/30/2012   Procedure: CRANIOTOMY TUMOR EXCISION;  Surgeon: Winfield Cunas, MD;  Location: [facility];  Service: Neurosurgery;  Laterality: Right;   craniotomy for intracranial mass.   CRANIOTOMY  03/31/2012   Procedure: CRANIOTOMY TUMOR EXCISION;  Surgeon: Winfield Cunas, MD;  Location: [facility];  Service: Neurosurgery;  Laterality: Right;  Craniotomy for excision of mass   Deep Vein     FRACTURE SURGERY     right arm   LAPAROSCOPIC APPENDECTOMY  11/20/2011   LAPAROSCOPIC APPENDECTOMY  11/20/2011   Procedure: APPENDECTOMY LAPAROSCOPIC;  Surgeon: Harl Bowie, MD;  Location: [facility];  Service: General;  Laterality: N/A;   PATENT FORAMEN OVALE CLOSURE     Pulmonary Embolus     ULNAR TUNNEL RELEASE     Family History  Problem Relation Age of Onset   Bladder Cancer Mother    Stroke Father    Thyroid disease Sister    Diabetes Brother    Allergies Other        whole family   Asthma Other        whole family    Social History   Tobacco Use  Smoking status: Never   Smokeless tobacco: Never  Substance Use Topics   Alcohol use: No    Comment: quit 10 years ago   Marital Status: Married  ROS  Review of Systems  Cardiovascular:  Positive for dyspnea on exertion. Negative for chest pain and leg swelling.  Respiratory:  Positive for wheezing.   Musculoskeletal:  Positive for arthritis.  Gastrointestinal:  Negative for melena.   Objective  Blood pressure (!) 155/79, pulse 80, temperature 98.3 F (36.8 C), temperature source Temporal, resp. rate 16, height 5' 6.5" (1.689 m), weight 189 lb 3.2 oz (85.8 kg), SpO2 95 %.     02/26/2022   11:02 AM 02/17/2021   12:28 PM 02/17/2021   12:14 PM  Vitals with BMI  Height 5' 6.5"  5' 6.75"  Weight 189 lbs 3 oz  195 lbs  BMI 84.66  59.93  Systolic 570 177 939  Diastolic 79 030 092  Pulse 80  77     Physical Exam Neck:     Vascular: No  carotid bruit or JVD.  Cardiovascular:     Rate and Rhythm: Normal rate and regular rhythm.     Pulses: Intact distal pulses.     Heart sounds: Murmur heard.     Blowing decrescendo early diastolic murmur is present with a grade of 3/4 at the upper right sternal border radiating to the apex.     No gallop.  Pulmonary:     Effort: Pulmonary effort is normal.     Breath sounds: Normal breath sounds.  Abdominal:     General: Bowel sounds are normal.     Palpations: Abdomen is soft.  Musculoskeletal:     Right lower leg: No edema.     Left lower leg: No edema.    Laboratory examination:   Lab Results  Component Value Date   NA 145 (H) 02/11/2021   K 4.1 02/11/2021   CO2 24 02/11/2021   GLUCOSE 91 02/11/2021   BUN 26 02/11/2021   CREATININE 1.45 (H) 02/11/2021   CALCIUM 10.0 02/11/2021   EGFR 54 (L) 02/11/2021   GFRNONAA 52 (L) 03/22/2012    CrCl cannot be calculated (Patient's most recent lab result is older than the maximum 21 days allowed.).     Latest Ref Rng & Units 02/11/2021    8:39 AM 08/22/2012    5:27 PM 03/31/2012    4:50 PM  CMP  Glucose 65 - 99 mg/dL 91  89  123   BUN 8 - 27 mg/dL 26  24    Creatinine 0.76 - 1.27 mg/dL 1.45  1.50    Sodium 134 - 144 mmol/L 145  139  142   Potassium 3.5 - 5.2 mmol/L 4.1  3.9  4.0   Chloride 96 - 106 mmol/L 103  103    CO2 20 - 29 mmol/L 24     Calcium 8.6 - 10.2 mg/dL 10.0     Total Protein 6.0 - 8.5 g/dL 6.9     Total Bilirubin 0.0 - 1.2 mg/dL 0.5     Alkaline Phos 44 - 121 IU/L 69     AST 0 - 40 IU/L 23     ALT 0 - 44 IU/L 21         Latest Ref Rng & Units 06/19/2020    9:34 AM 08/22/2012    5:27 PM 03/31/2012    4:50 PM  CBC  WBC 4.0 - 10.5 K/uL 7.5     Hemoglobin 13.0 -  17.0 g/dL 14.4  15.0  11.9   Hematocrit 39.0 - 52.0 % 42.1  44.0  35.0   Platelets 150.0 - 400.0 K/uL 256.0      Lipid Panel     Component Value Date/Time   CHOL 127 02/11/2021 0838   TRIG 87 02/11/2021 0838   HDL 51 02/11/2021 0838   LDLCALC  59 02/11/2021 0838   LABVLDL 17 02/11/2021 0838   External labs:   Labs 11/05/2021:  TSH normal at 2.07.  Labs 02/11/2021:  Serum glucose 91 mg, BUN 26, creatinine 1.45, EGFR 54 mL.  Total cholesterol 127, triglycerides 87, HDL 51, LDL 59.  Medications and allergies   Allergies  Allergen Reactions   Iodinated Contrast Media Anaphylaxis   Iohexol Anaphylaxis and Shortness Of Breath     Code: HIVES, Desc: hives and tachycardia last time pt received IV CM kdean 07/19/06, Onset Date: [id number]   On 9/9 pt had a ct head w/ and received 13hr premedication. Was fine after scan, but broke out in hives when he got home.  Took 115m benedryl at home PO, and was fine.    Ceftriaxone Other (See Comments)    rash   Codeine     agitation   Dilantin [Phenytoin] Hives   Heparin Hives   Hydrocodone     Other reaction(s): agitation   Nuvigil [Armodafinil]     rash   Other     Follows Kosher Diet; Needs Kosher products   Thyroid     Other reaction(s): mouth swelling   Flagyl [Metronidazole] Rash     Current Outpatient Medications:    albuterol (PROVENTIL HFA;VENTOLIN HFA) 108 (90 BASE) MCG/ACT inhaler, Inhale 2 puffs into the lungs every 6 (six) hours as needed. For shortness of breath, Disp: , Rfl:    azelastine (ASTELIN) 0.1 % nasal spray, as needed. , Disp: , Rfl:    escitalopram (LEXAPRO) 10 MG tablet, Take 1 tablet by mouth daily., Disp: , Rfl:    fluticasone-salmeterol (ADVAIR HFA) 115-21 MCG/ACT inhaler, Advair HFA 115 mcg-21 mcg/actuation aerosol inhaler, Disp: , Rfl:    irbesartan (AVAPRO) 150 MG tablet, Take by mouth daily., Disp: , Rfl:    levETIRAcetam (KEPPRA) 500 MG tablet, Take 500 mg by mouth 2 (two) times daily. , Disp: , Rfl:    levothyroxine (SYNTHROID) 50 MCG tablet, TAKE 1 TABLET BY MOUTH  DAILY, Disp: 90 tablet, Rfl: 3   pantoprazole (PROTONIX) 40 MG tablet, Take 40 mg by mouth daily., Disp: , Rfl:    rosuvastatin (CRESTOR) 10 MG tablet, TAKE 1 TABLET BY MOUTH  DAILY,  Disp: 90 tablet, Rfl: 3    Radiology:   Ultrasound of the abdomen 09/18/2020: 1. Multiple hepatic cysts, largest measuring 7.0 cm in the right hepatic lobe. 2. Two small 1.2 cm and 0.9 cm round hyperechoic lesions in the right hepatic lobe, probably hemangiomas, benign. If definitive characterization is required, consider follow-up MRI abdomen without and with contrast in 6 months. 3. Low-density lesions throughout the liver upper measuring up to 6 cm, favor cysts, but these cannot be fully characterized on this noncontrast study.   CT chest without contrast 12/24/2021: 1. 4.2 cm ascending thoracic aortic aneurysm, stable from the cardiac CT performed on 08/21/2020. Recommend annual imaging followup by CTA or MRA. This recommendation follows 2010 ACCF/AHA/AATS/ACR/ASA/SCA/SCAI/SIR/STS/SVM Guidelines for the Diagnosis and Management of Patients with Thoracic Aortic Disease. Circulation. 2010; 121:: J188-C166 Aortic aneurysm NOS (ICD10-I71.9) 2. No acute findings. 3. Minor coronary artery calcifications and mild aortic  atherosclerosis. 4. Aortic Atherosclerosis (ICD10-I70.0).  Cardiac Studies:   ASD repair 01/11/2006: 1.  Intracardiac echocardiogram. 2.  Closure of the PFO with 28 mm CardioSeal septal occluder.  Coronary calcium score 08/21/2020: Total Agatston score 16.4, MeSA database percentile 34. Left main: 0; LAD: 0; LCx: 16.4; RCA: 0. Ascending aorta is dilated at 42 mm.    PCV CARDIAC STRESS TEST 10/07/2020 Functional status: Fair Chest pain: No. Reason for stopping exercise: Fatigue/weakness. Hypertensive response to exercise: No. Exercise time 6 minutes 47 seconds on Bruce protocol, achieved 8.25 METS, and 90% of age-predicted maximum heart rate. Stress ECG negative for ischemia. Low risk study. No prior studies available for comparison.  PCV ECHOCARDIOGRAM COMPLETE 02/04/2022  Narrative Echocardiogram 02/04/2022: Mildly depressed LV systolic function with EF 46%.  Left ventricle cavity is moderate to severely dilated. Moderate concentric hypertrophy of the left ventricle. Hypokinetic global wall motion. Calculated EF 46%. Left atrial cavity is moderately dilated at 4.2 cm. Structurally normal trileaflet aortic valve.  Moderate to severe aortic regurgitation. Structurally normal mitral valve.  Mild to moderate mitral regurgitation. Structurally normal tricuspid valve.  Moderate tricuspid regurgitation. Moderate to severe pulmonary hypertension. RVSP measures 60 mmHg. Dilated pulmonary artery. IVC is dilated with respiratory variation. Compared to the study done on 10/11/2020, no change in aortic root dilatation, moderate aortic regurgitation is now moderate to severe and pulmonary hypertension is new.       EKG:   EKG 02/26/2022: Normal sinus rhythm at rate of 72 bpm, normal axis, nonspecific T wave flattening in high lateral leads.  Overall no significant change since 09/30/2020.  Assessment     ICD-10-CM   1. Non-ischemic cardiomyopathy (HCC)  I42.8     2. Severe aortic regurgitation  I35.1 CMP14+EGFR    CBC    3. Aortic root dilatation (HCC)  I77.810     4. Primary hypertension  I10 EKG 12-Lead    5. Stage 3a chronic kidney disease (HCC)  N18.31     6. Mild hyperlipidemia  E78.5 Lipid Panel With LDL/HDL Ratio      Medications Discontinued During This Encounter  Medication Reason   fexofenadine (ALLEGRA) 180 MG tablet    hydrochlorothiazide (MICROZIDE) 12.5 MG capsule    ketoconazole (NIZORAL) 2 % shampoo    No orders of the defined types were placed in this encounter.  Orders Placed This Encounter  Procedures   Lipid Panel With LDL/HDL Ratio   CMP14+EGFR   CBC   EKG 12-Lead   Recommendations:   [name] is a 66 y.o. Caucasian male with brain abscess diagnosed in 2012 for which he underwent craniotomy, has mild residual left-sided facial and arm weakness and seizures controlled with Kepra.  He was also found to have a very  large PFO and as the brain abscess occurred after dental work-up, paradoxical embolus was felt to be the etiology.  Also during hospital admission, patient developed severe platypnea-orthodeoxia syndrome and hence underwent successful repair of the atrial septal defect with implantation of a 28 mm CardioSEAL septal occluder on 01/11/2006.  He was also tested positive for DVT and small peripheral PE at that time.    His past medical history significant for hypertension, hyperlipidemia, history of bronchial asthma, aortic root dilatation and chronic stage IIIa kidney disease related to prior NSAID use.  This is a annual visit and follow-up of aortic root dilatation and aortic regurgitation.  I reviewed the results of the echocardiogram which reveals moderately severe aortic regurgitation and LV dilatation and mild  reduction in LV systolic function which is new compared to prior echocardiogram a year ago.  No clinical evidence of heart failure.  Clinically I suspect his aortic regurgitation is severe.  He will need aortic valve replacement.  Although he has tricuspid valve regurgitation and mitral regurgitation, I suspect the tricuspid regurgitation is secondary to his severe AI and mild pulmonary hypertension related to severe AI and LV dilatation.  I will set him up for right and left heart catheterization.  He will need referral to CT surgery.  Otherwise renal function has remained stable, blood pressure is well controlled, he is tolerating all his medications.  Lipids are well controlled.  Blood pressure is elevated from anxiety from recent findings of echocardiogram, patient's blood pressure has been well controlled at home.  No changes in the medications were done today.  This was a 40-minute office visit encounter with him and his wife, discussions regarding nonischemic cardiomyopathy, need for valvular heart surgery and indication for the same and discussions regarding cardiac catheterization and risks  for invasive strategy.  All questions were answered. Schedule for cardiac catheterization, and possible angioplasty. We discussed regarding risks, benefits, alternatives to this including stress testing, CTA and continued medical therapy. Patient wants to proceed. Understands <1-2% risk of death, stroke, MI, urgent CABG, bleeding, infection, renal failure but not limited to these.    Adrian Prows, MD, [facility] 03/01/2022, 5:05 PM Office: [phone]

## 2022-02-26 NOTE — H&P (View-Only) (Signed)
Primary Physician/Referring:  Cari Caraway, MD  Patient ID: [name], male    DOB: Oct 08, 1955, 66 y.o.   MRN: [id number]  Chief Complaint  Patient presents with   Hypertension   Follow-up    1 year   HPI:    [name]  is a 66 y.o. Caucasian male with brain abscess diagnosed in 2012 for which he underwent craniotomy, has mild residual left-sided facial and arm weakness and seizures controlled with Kepra.  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  Also during hospital admission, patient developed severe platypnea-orthodeoxia syndrome and hence underwent successful repair of the atrial septal defect with implantation of a 28 mm CardioSEAL septal occluder on 01/11/2006.  He was also tested positive for DVT and small peripheral PE at that time.    His past medical history significant for hypertension, hyperlipidemia, history of bronchial asthma, aortic root dilatation and chronic stage IIIa kidney disease related to prior NSAID use.  This is a annual visit and follow-up of aortic root dilatation and aortic regurgitation.    Except for chronic dyspnea he has no specific complaints.  He is concerned about the recent echo findings.  No PND or orthopnea. No chest pain or syncope.  Past Medical History:  Diagnosis Date   ADHD    Anxiety    Arthritis    Asthma    excerise induced   Brain abscess    Chronic renal insufficiency, stage III (moderate) (HCC)    DVT (deep venous thrombosis) (HCC)    Esophageal reflux    History of osteomyelitis    History of transfusion of platelets    Hx of pulmonary embolus    x 2   Hyperlipidemia    Hypertension    Neuropathy    Patent foramen ovale    Seizures (HCC)    Sleep apnea    Sleep study done 5-6  years ago machine set at 10   Past Surgical History:  Procedure Laterality Date   ANTERIOR CRUCIATE LIGAMENT REPAIR     BRAIN  SURGERY     abscess drainage x 2   CARPAL TUNNEL RELEASE     CRANIOTOMY     CRANIOTOMY  03/30/2012   Procedure: CRANIOTOMY TUMOR EXCISION;  Surgeon: Winfield Cunas, MD;  Location: [facility];  Service: Neurosurgery;  Laterality: Right;   craniotomy for intracranial mass.   CRANIOTOMY  03/31/2012   Procedure: CRANIOTOMY TUMOR EXCISION;  Surgeon: Winfield Cunas, MD;  Location: [facility];  Service: Neurosurgery;  Laterality: Right;  Craniotomy for excision of mass   Deep Vein     FRACTURE SURGERY     right arm   LAPAROSCOPIC APPENDECTOMY  11/20/2011   LAPAROSCOPIC APPENDECTOMY  11/20/2011   Procedure: APPENDECTOMY LAPAROSCOPIC;  Surgeon: Harl Bowie, MD;  Location: [facility];  Service: General;  Laterality: N/A;   PATENT FORAMEN OVALE CLOSURE     Pulmonary Embolus     ULNAR TUNNEL RELEASE     Family History  Problem Relation Age of Onset   Bladder Cancer Mother    Stroke Father    Thyroid disease Sister    Diabetes Brother    Allergies Other        whole family   Asthma Other        whole family    Social History   Tobacco Use  Smoking status: Never   Smokeless tobacco: Never  Substance Use Topics   Alcohol use: No    Comment: quit 10 years ago   Marital Status: Married  ROS  Review of Systems  Cardiovascular:  Positive for dyspnea on exertion. Negative for chest pain and leg swelling.  Respiratory:  Positive for wheezing.   Musculoskeletal:  Positive for arthritis.  Gastrointestinal:  Negative for melena.   Objective  Blood pressure (!) 155/79, pulse 80, temperature 98.3 F (36.8 C), temperature source Temporal, resp. rate 16, height 5' 6.5" (1.689 m), weight 189 lb 3.2 oz (85.8 kg), SpO2 95 %.     02/26/2022   11:02 AM 02/17/2021   12:28 PM 02/17/2021   12:14 PM  Vitals with BMI  Height 5' 6.5"  5' 6.75"  Weight 189 lbs 3 oz  195 lbs  BMI 84.66  59.93  Systolic 570 177 939  Diastolic 79 030 092  Pulse 80  77     Physical Exam Neck:     Vascular: No  carotid bruit or JVD.  Cardiovascular:     Rate and Rhythm: Normal rate and regular rhythm.     Pulses: Intact distal pulses.     Heart sounds: Murmur heard.     Blowing decrescendo early diastolic murmur is present with a grade of 3/4 at the upper right sternal border radiating to the apex.     No gallop.  Pulmonary:     Effort: Pulmonary effort is normal.     Breath sounds: Normal breath sounds.  Abdominal:     General: Bowel sounds are normal.     Palpations: Abdomen is soft.  Musculoskeletal:     Right lower leg: No edema.     Left lower leg: No edema.    Laboratory examination:   Lab Results  Component Value Date   NA 145 (H) 02/11/2021   K 4.1 02/11/2021   CO2 24 02/11/2021   GLUCOSE 91 02/11/2021   BUN 26 02/11/2021   CREATININE 1.45 (H) 02/11/2021   CALCIUM 10.0 02/11/2021   EGFR 54 (L) 02/11/2021   GFRNONAA 52 (L) 03/22/2012    CrCl cannot be calculated (Patient's most recent lab result is older than the maximum 21 days allowed.).     Latest Ref Rng & Units 02/11/2021    8:39 AM 08/22/2012    5:27 PM 03/31/2012    4:50 PM  CMP  Glucose 65 - 99 mg/dL 91  89  123   BUN 8 - 27 mg/dL 26  24    Creatinine 0.76 - 1.27 mg/dL 1.45  1.50    Sodium 134 - 144 mmol/L 145  139  142   Potassium 3.5 - 5.2 mmol/L 4.1  3.9  4.0   Chloride 96 - 106 mmol/L 103  103    CO2 20 - 29 mmol/L 24     Calcium 8.6 - 10.2 mg/dL 10.0     Total Protein 6.0 - 8.5 g/dL 6.9     Total Bilirubin 0.0 - 1.2 mg/dL 0.5     Alkaline Phos 44 - 121 IU/L 69     AST 0 - 40 IU/L 23     ALT 0 - 44 IU/L 21         Latest Ref Rng & Units 06/19/2020    9:34 AM 08/22/2012    5:27 PM 03/31/2012    4:50 PM  CBC  WBC 4.0 - 10.5 K/uL 7.5     Hemoglobin 13.0 -  17.0 g/dL 14.4  15.0  11.9   Hematocrit 39.0 - 52.0 % 42.1  44.0  35.0   Platelets 150.0 - 400.0 K/uL 256.0      Lipid Panel     Component Value Date/Time   CHOL 127 02/11/2021 0838   TRIG 87 02/11/2021 0838   HDL 51 02/11/2021 0838   LDLCALC  59 02/11/2021 0838   LABVLDL 17 02/11/2021 0838   External labs:   Labs 11/05/2021:  TSH normal at 2.07.  Labs 02/11/2021:  Serum glucose 91 mg, BUN 26, creatinine 1.45, EGFR 54 mL.  Total cholesterol 127, triglycerides 87, HDL 51, LDL 59.  Medications and allergies   Allergies  Allergen Reactions   Iodinated Contrast Media Anaphylaxis   Iohexol Anaphylaxis and Shortness Of Breath     Code: HIVES, Desc: hives and tachycardia last time pt received IV CM kdean 07/19/06, Onset Date: [id number]   On 9/9 pt had a ct head w/ and received 13hr premedication. Was fine after scan, but broke out in hives when he got home.  Took 115m benedryl at home PO, and was fine.    Ceftriaxone Other (See Comments)    rash   Codeine     agitation   Dilantin [Phenytoin] Hives   Heparin Hives   Hydrocodone     Other reaction(s): agitation   Nuvigil [Armodafinil]     rash   Other     Follows Kosher Diet; Needs Kosher products   Thyroid     Other reaction(s): mouth swelling   Flagyl [Metronidazole] Rash     Current Outpatient Medications:    albuterol (PROVENTIL HFA;VENTOLIN HFA) 108 (90 BASE) MCG/ACT inhaler, Inhale 2 puffs into the lungs every 6 (six) hours as needed. For shortness of breath, Disp: , Rfl:    azelastine (ASTELIN) 0.1 % nasal spray, as needed. , Disp: , Rfl:    escitalopram (LEXAPRO) 10 MG tablet, Take 1 tablet by mouth daily., Disp: , Rfl:    fluticasone-salmeterol (ADVAIR HFA) 115-21 MCG/ACT inhaler, Advair HFA 115 mcg-21 mcg/actuation aerosol inhaler, Disp: , Rfl:    irbesartan (AVAPRO) 150 MG tablet, Take by mouth daily., Disp: , Rfl:    levETIRAcetam (KEPPRA) 500 MG tablet, Take 500 mg by mouth 2 (two) times daily. , Disp: , Rfl:    levothyroxine (SYNTHROID) 50 MCG tablet, TAKE 1 TABLET BY MOUTH  DAILY, Disp: 90 tablet, Rfl: 3   pantoprazole (PROTONIX) 40 MG tablet, Take 40 mg by mouth daily., Disp: , Rfl:    rosuvastatin (CRESTOR) 10 MG tablet, TAKE 1 TABLET BY MOUTH  DAILY,  Disp: 90 tablet, Rfl: 3    Radiology:   Ultrasound of the abdomen 09/18/2020: 1. Multiple hepatic cysts, largest measuring 7.0 cm in the right hepatic lobe. 2. Two small 1.2 cm and 0.9 cm round hyperechoic lesions in the right hepatic lobe, probably hemangiomas, benign. If definitive characterization is required, consider follow-up MRI abdomen without and with contrast in 6 months. 3. Low-density lesions throughout the liver upper measuring up to 6 cm, favor cysts, but these cannot be fully characterized on this noncontrast study.   CT chest without contrast 12/24/2021: 1. 4.2 cm ascending thoracic aortic aneurysm, stable from the cardiac CT performed on 08/21/2020. Recommend annual imaging followup by CTA or MRA. This recommendation follows 2010 ACCF/AHA/AATS/ACR/ASA/SCA/SCAI/SIR/STS/SVM Guidelines for the Diagnosis and Management of Patients with Thoracic Aortic Disease. Circulation. 2010; 121:: J188-C166 Aortic aneurysm NOS (ICD10-I71.9) 2. No acute findings. 3. Minor coronary artery calcifications and mild aortic  atherosclerosis. 4. Aortic Atherosclerosis (ICD10-I70.0).  Cardiac Studies:   ASD repair 01/11/2006: 1.  Intracardiac echocardiogram. 2.  Closure of the PFO with 28 mm CardioSeal septal occluder.  Coronary calcium score 08/21/2020: Total Agatston score 16.4, MeSA database percentile 34. Left main: 0; LAD: 0; LCx: 16.4; RCA: 0. Ascending aorta is dilated at 42 mm.    PCV CARDIAC STRESS TEST 10/07/2020 Functional status: Fair Chest pain: No. Reason for stopping exercise: Fatigue/weakness. Hypertensive response to exercise: No. Exercise time 6 minutes 47 seconds on Bruce protocol, achieved 8.25 METS, and 90% of age-predicted maximum heart rate. Stress ECG negative for ischemia. Low risk study. No prior studies available for comparison.  PCV ECHOCARDIOGRAM COMPLETE 02/04/2022  Narrative Echocardiogram 02/04/2022: Mildly depressed LV systolic function with EF 46%.  Left ventricle cavity is moderate to severely dilated. Moderate concentric hypertrophy of the left ventricle. Hypokinetic global wall motion. Calculated EF 46%. Left atrial cavity is moderately dilated at 4.2 cm. Structurally normal trileaflet aortic valve.  Moderate to severe aortic regurgitation. Structurally normal mitral valve.  Mild to moderate mitral regurgitation. Structurally normal tricuspid valve.  Moderate tricuspid regurgitation. Moderate to severe pulmonary hypertension. RVSP measures 60 mmHg. Dilated pulmonary artery. IVC is dilated with respiratory variation. Compared to the study done on 10/11/2020, no change in aortic root dilatation, moderate aortic regurgitation is now moderate to severe and pulmonary hypertension is new.       EKG:   EKG 02/26/2022: Normal sinus rhythm at rate of 72 bpm, normal axis, nonspecific T wave flattening in high lateral leads.  Overall no significant change since 09/30/2020.  Assessment     ICD-10-CM   1. Non-ischemic cardiomyopathy (HCC)  I42.8     2. Severe aortic regurgitation  I35.1 CMP14+EGFR    CBC    3. Aortic root dilatation (HCC)  I77.810     4. Primary hypertension  I10 EKG 12-Lead    5. Stage 3a chronic kidney disease (HCC)  N18.31     6. Mild hyperlipidemia  E78.5 Lipid Panel With LDL/HDL Ratio      Medications Discontinued During This Encounter  Medication Reason   fexofenadine (ALLEGRA) 180 MG tablet    hydrochlorothiazide (MICROZIDE) 12.5 MG capsule    ketoconazole (NIZORAL) 2 % shampoo    No orders of the defined types were placed in this encounter.  Orders Placed This Encounter  Procedures   Lipid Panel With LDL/HDL Ratio   CMP14+EGFR   CBC   EKG 12-Lead   Recommendations:   [name] is a 66 y.o. Caucasian male with brain abscess diagnosed in 2012 for which he underwent craniotomy, has mild residual left-sided facial and arm weakness and seizures controlled with Kepra.  He was also found to have a very  large PFO and as the brain abscess occurred after dental work-up, paradoxical embolus was felt to be the etiology.  Also during hospital admission, patient developed severe platypnea-orthodeoxia syndrome and hence underwent successful repair of the atrial septal defect with implantation of a 28 mm CardioSEAL septal occluder on 01/11/2006.  He was also tested positive for DVT and small peripheral PE at that time.    His past medical history significant for hypertension, hyperlipidemia, history of bronchial asthma, aortic root dilatation and chronic stage IIIa kidney disease related to prior NSAID use.  This is a annual visit and follow-up of aortic root dilatation and aortic regurgitation.  I reviewed the results of the echocardiogram which reveals moderately severe aortic regurgitation and LV dilatation and mild  reduction in LV systolic function which is new compared to prior echocardiogram a year ago.  No clinical evidence of heart failure.  Clinically I suspect his aortic regurgitation is severe.  He will need aortic valve replacement.  Although he has tricuspid valve regurgitation and mitral regurgitation, I suspect the tricuspid regurgitation is secondary to his severe AI and mild pulmonary hypertension related to severe AI and LV dilatation.  I will set him up for right and left heart catheterization.  He will need referral to CT surgery.  Otherwise renal function has remained stable, blood pressure is well controlled, he is tolerating all his medications.  Lipids are well controlled.  Blood pressure is elevated from anxiety from recent findings of echocardiogram, patient's blood pressure has been well controlled at home.  No changes in the medications were done today.  This was a 40-minute office visit encounter with him and his wife, discussions regarding nonischemic cardiomyopathy, need for valvular heart surgery and indication for the same and discussions regarding cardiac catheterization and risks  for invasive strategy.  All questions were answered. Schedule for cardiac catheterization, and possible angioplasty. We discussed regarding risks, benefits, alternatives to this including stress testing, CTA and continued medical therapy. Patient wants to proceed. Understands <1-2% risk of death, stroke, MI, urgent CABG, bleeding, infection, renal failure but not limited to these.    Adrian Prows, MD, [facility] 03/01/2022, 5:05 PM Office: [phone]

## 2022-03-02 ENCOUNTER — Encounter: Payer: Self-pay | Admitting: Cardiology

## 2022-03-02 NOTE — Telephone Encounter (Signed)
From patient.

## 2022-03-03 ENCOUNTER — Encounter: Payer: Self-pay | Admitting: Cardiology

## 2022-03-03 NOTE — Telephone Encounter (Signed)
From patient.

## 2022-03-04 LAB — CMP14+EGFR
ALT: 17 IU/L (ref 0–44)
AST: 22 IU/L (ref 0–40)
Albumin/Globulin Ratio: 2 (ref 1.2–2.2)
Albumin: 4.5 g/dL (ref 3.9–4.9)
Alkaline Phosphatase: 62 IU/L (ref 44–121)
BUN/Creatinine Ratio: 13 (ref 10–24)
BUN: 19 mg/dL (ref 8–27)
Bilirubin Total: 0.7 mg/dL (ref 0.0–1.2)
CO2: 23 mmol/L (ref 20–29)
Calcium: 9.6 mg/dL (ref 8.6–10.2)
Chloride: 103 mmol/L (ref 96–106)
Creatinine, Ser: 1.43 mg/dL — ABNORMAL HIGH (ref 0.76–1.27)
Globulin, Total: 2.2 g/dL (ref 1.5–4.5)
Glucose: 86 mg/dL (ref 70–99)
Potassium: 4.2 mmol/L (ref 3.5–5.2)
Sodium: 141 mmol/L (ref 134–144)
Total Protein: 6.7 g/dL (ref 6.0–8.5)
eGFR: 54 mL/min/{1.73_m2} — ABNORMAL LOW (ref >59)

## 2022-03-04 LAB — CBC
Hematocrit: 42.8 % (ref 37.5–51.0)
Hemoglobin: 14.2 g/dL (ref 13.0–17.7)
MCH: 31.1 pg (ref 26.6–33.0)
MCHC: 33.2 g/dL (ref 31.5–35.7)
MCV: 94 fL (ref 79–97)
Platelets: 243 10*3/uL (ref 150–450)
RBC: 4.56 x10E6/uL (ref 4.14–5.80)
RDW: 13.2 % (ref 11.6–15.4)
WBC: 8.5 10*3/uL (ref 3.4–10.8)

## 2022-03-04 LAB — LIPID PANEL WITH LDL/HDL RATIO
Cholesterol, Total: 127 mg/dL (ref 100–199)
HDL: 53 mg/dL (ref >39)
LDL Chol Calc (NIH): 55 mg/dL (ref 0–99)
LDL/HDL Ratio: 1 ratio (ref 0.0–3.6)
Triglycerides: 100 mg/dL (ref 0–149)
VLDL Cholesterol Cal: 19 mg/dL (ref 5–40)

## 2022-03-10 ENCOUNTER — Encounter (HOSPITAL_COMMUNITY): Payer: Self-pay | Admitting: Cardiology

## 2022-03-10 ENCOUNTER — Ambulatory Visit (HOSPITAL_COMMUNITY)
Admission: RE | Admit: 2022-03-10 | Discharge: 2022-03-10 | Disposition: A | Discharge status: Home / Self Care | Payer: 59 | Attending: Cardiology | Admitting: Cardiology

## 2022-03-10 ENCOUNTER — Encounter: Payer: Self-pay | Admitting: Cardiology

## 2022-03-10 ENCOUNTER — Other Ambulatory Visit: Payer: Self-pay

## 2022-03-10 ENCOUNTER — Encounter (HOSPITAL_COMMUNITY)
Admission: RE | Disposition: A | Discharge status: Home / Self Care | Payer: Self-pay | Source: Home / Self Care | Attending: Cardiology

## 2022-03-10 DIAGNOSIS — I129 Hypertensive chronic kidney disease with stage 1 through stage 4 chronic kidney disease, or unspecified chronic kidney disease: Secondary | ICD-10-CM | POA: Insufficient documentation

## 2022-03-10 DIAGNOSIS — I77810 Thoracic aortic ectasia: Secondary | ICD-10-CM | POA: Diagnosis not present

## 2022-03-10 DIAGNOSIS — I272 Pulmonary hypertension, unspecified: Secondary | ICD-10-CM | POA: Insufficient documentation

## 2022-03-10 DIAGNOSIS — I2510 Atherosclerotic heart disease of native coronary artery without angina pectoris: Secondary | ICD-10-CM

## 2022-03-10 DIAGNOSIS — I428 Other cardiomyopathies: Secondary | ICD-10-CM | POA: Insufficient documentation

## 2022-03-10 DIAGNOSIS — E785 Hyperlipidemia, unspecified: Secondary | ICD-10-CM | POA: Diagnosis not present

## 2022-03-10 DIAGNOSIS — N1831 Chronic kidney disease, stage 3a: Secondary | ICD-10-CM | POA: Diagnosis not present

## 2022-03-10 DIAGNOSIS — I351 Nonrheumatic aortic (valve) insufficiency: Secondary | ICD-10-CM | POA: Insufficient documentation

## 2022-03-10 HISTORY — PX: RIGHT/LEFT HEART CATH AND CORONARY ANGIOGRAPHY: CATH118266

## 2022-03-10 LAB — POCT I-STAT EG7
Acid-Base Excess: 2 mmol/L (ref 0.0–2.0)
Acid-Base Excess: 2 mmol/L (ref 0.0–2.0)
Acid-Base Excess: 2 mmol/L (ref 0.0–2.0)
Bicarbonate: 27.2 mmol/L (ref 20.0–28.0)
Bicarbonate: 28.2 mmol/L — ABNORMAL HIGH (ref 20.0–28.0)
Bicarbonate: 28.2 mmol/L — ABNORMAL HIGH (ref 20.0–28.0)
Calcium, Ion: 1.25 mmol/L (ref 1.15–1.40)
Calcium, Ion: 1.25 mmol/L (ref 1.15–1.40)
Calcium, Ion: 1.27 mmol/L (ref 1.15–1.40)
HCT: 37 % — ABNORMAL LOW (ref 39.0–52.0)
HCT: 37 % — ABNORMAL LOW (ref 39.0–52.0)
HCT: 38 % — ABNORMAL LOW (ref 39.0–52.0)
Hemoglobin: 12.6 g/dL — ABNORMAL LOW (ref 13.0–17.0)
Hemoglobin: 12.6 g/dL — ABNORMAL LOW (ref 13.0–17.0)
Hemoglobin: 12.9 g/dL — ABNORMAL LOW (ref 13.0–17.0)
O2 Saturation: 95 %
O2 Saturation: 65 %
O2 Saturation: 66 %
Patient temperature: 42
Potassium: 4.3 mmol/L (ref 3.5–5.1)
Potassium: 4.1 mmol/L (ref 3.5–5.1)
Potassium: 4.1 mmol/L (ref 3.5–5.1)
Sodium: 142 mmol/L (ref 135–145)
Sodium: 143 mmol/L (ref 135–145)
Sodium: 143 mmol/L (ref 135–145)
TCO2: 28 mmol/L (ref 22–32)
TCO2: 30 mmol/L (ref 22–32)
TCO2: 30 mmol/L (ref 22–32)
pCO2, Ven: 53.8 mmHg (ref 44–60)
pCO2, Ven: 47 mmHg (ref 44–60)
pCO2, Ven: 47.1 mmHg (ref 44–60)
pH, Ven: 7.332 (ref 7.25–7.43)
pH, Ven: 7.385 (ref 7.25–7.43)
pH, Ven: 7.386 (ref 7.25–7.43)
pO2, Ven: 105 mmHg — ABNORMAL HIGH (ref 32–45)
pO2, Ven: 35 mmHg (ref 32–45)
pO2, Ven: 35 mmHg (ref 32–45)

## 2022-03-10 SURGERY — RIGHT/LEFT HEART CATH AND CORONARY ANGIOGRAPHY
Anesthesia: LOCAL

## 2022-03-10 MED ORDER — SODIUM CHLORIDE 0.9 % WEIGHT BASED INFUSION
3.0000 mL/kg/h | INTRAVENOUS | Status: AC
  Administered 2022-03-10: 3 mL/kg/h via INTRAVENOUS

## 2022-03-10 MED ORDER — SODIUM CHLORIDE 0.9% FLUSH: 3.0000 mL | INTRAVENOUS | Status: DC | PRN

## 2022-03-10 MED ORDER — ASPIRIN 81 MG PO CHEW: 81.0000 mg | CHEWABLE_TABLET | ORAL | Status: DC

## 2022-03-10 MED ORDER — LIDOCAINE HCL (PF) 1 % IJ SOLN
INTRAMUSCULAR | Status: AC
  Filled 2022-03-10: qty 30

## 2022-03-10 MED ORDER — DIPHENHYDRAMINE HCL 50 MG/ML IJ SOLN
50.0000 mg | Freq: Once | INTRAMUSCULAR | Status: AC
  Administered 2022-03-10: 50 mg via INTRAVENOUS
  Filled 2022-03-10: qty 1

## 2022-03-10 MED ORDER — SODIUM CHLORIDE 0.9 % WEIGHT BASED INFUSION: 1.0000 mL/kg/h | INTRAVENOUS | Status: DC

## 2022-03-10 MED ORDER — VERAPAMIL HCL 2.5 MG/ML IV SOLN
INTRAVENOUS | Status: AC
  Filled 2022-03-10: qty 2

## 2022-03-10 MED ORDER — SODIUM CHLORIDE 0.9% FLUSH: 3.0000 mL | Freq: Two times a day (BID) | INTRAVENOUS | Status: DC

## 2022-03-10 MED ORDER — METHYLPREDNISOLONE SODIUM SUCC 125 MG IJ SOLR
125.0000 mg | Freq: Once | INTRAMUSCULAR | Status: AC
  Administered 2022-03-10: 125 mg via INTRAVENOUS
  Filled 2022-03-10: qty 2

## 2022-03-10 MED ORDER — ASPIRIN 81 MG PO CHEW
81.0000 mg | CHEWABLE_TABLET | ORAL | Status: AC
  Administered 2022-03-10: 81 mg via ORAL
  Filled 2022-03-10: qty 1

## 2022-03-10 MED ORDER — HEPARIN SODIUM (PORCINE) 1000 UNIT/ML IJ SOLN
INTRAMUSCULAR | Status: DC | PRN
  Administered 2022-03-10: 5000 [IU] via INTRAVENOUS

## 2022-03-10 MED ORDER — MIDAZOLAM HCL 2 MG/2ML IJ SOLN
INTRAMUSCULAR | Status: AC
  Filled 2022-03-10: qty 2

## 2022-03-10 MED ORDER — ONDANSETRON HCL 4 MG/2ML IJ SOLN: 4.0000 mg | Freq: Four times a day (QID) | INTRAMUSCULAR | Status: DC | PRN

## 2022-03-10 MED ORDER — ACETAMINOPHEN 325 MG PO TABS: 650.0000 mg | ORAL_TABLET | ORAL | Status: DC | PRN

## 2022-03-10 MED ORDER — SODIUM CHLORIDE 0.9 % IV SOLN: 250.0000 mL | INTRAVENOUS | Status: DC | PRN

## 2022-03-10 MED ORDER — MIDAZOLAM HCL 2 MG/2ML IJ SOLN
INTRAMUSCULAR | Status: DC | PRN
  Administered 2022-03-10: 2 mg via INTRAVENOUS

## 2022-03-10 MED ORDER — HEPARIN (PORCINE) IN NACL 1000-0.9 UT/500ML-% IV SOLN
INTRAVENOUS | Status: DC | PRN
  Administered 2022-03-10 (×2): 500 mL

## 2022-03-10 MED ORDER — FENTANYL CITRATE (PF) 100 MCG/2ML IJ SOLN
INTRAMUSCULAR | Status: AC
  Filled 2022-03-10: qty 2

## 2022-03-10 MED ORDER — HEPARIN SODIUM (PORCINE) 1000 UNIT/ML IJ SOLN
INTRAMUSCULAR | Status: AC
  Filled 2022-03-10: qty 10

## 2022-03-10 MED ORDER — VERAPAMIL HCL 2.5 MG/ML IV SOLN
INTRAVENOUS | Status: DC | PRN
  Administered 2022-03-10: 10 mL via INTRA_ARTERIAL

## 2022-03-10 MED ORDER — FENTANYL CITRATE (PF) 100 MCG/2ML IJ SOLN
INTRAMUSCULAR | Status: DC | PRN
Start: 2022-03-10 — End: 2022-03-10
  Administered 2022-03-10: 25 ug via INTRAVENOUS

## 2022-03-10 MED ORDER — LIDOCAINE HCL (PF) 1 % IJ SOLN
INTRAMUSCULAR | Status: DC | PRN
  Administered 2022-03-10: 2 mL
  Administered 2022-03-10: 1 mL

## 2022-03-10 SURGICAL SUPPLY — 14 items
BAND ZEPHYR COMPRESS 30 LONG (HEMOSTASIS) IMPLANT
CATH BALLN WEDGE 5F 110CM (CATHETERS) IMPLANT
CATH OPTITORQUE TIG 4.0 5F (CATHETERS) IMPLANT
GLIDESHEATH SLEND A-KIT 6F 22G (SHEATH) IMPLANT
GLIDESHEATH SLEND SS 6F .021 (SHEATH) IMPLANT
GUIDEWIRE .025 260CM (WIRE) IMPLANT
GUIDEWIRE INQWIRE 1.5J.035X260 (WIRE) IMPLANT
INQWIRE 1.5J .035X260CM (WIRE) ×1
KIT HEART LEFT (KITS) ×1 IMPLANT
PACK CARDIAC CATHETERIZATION (CUSTOM PROCEDURE TRAY) ×1 IMPLANT
SHEATH GLIDE SLENDER 4/5FR (SHEATH) IMPLANT
TRANSDUCER W/STOPCOCK (MISCELLANEOUS) ×1 IMPLANT
TUBING CIL FLEX 10 FLL-RA (TUBING) ×1 IMPLANT
WIRE HI TORQ VERSACORE-J 145CM (WIRE) IMPLANT

## 2022-03-10 NOTE — Interval H&P Note (Signed)
History and Physical Interval Note:  03/10/2022 10:08 AM  [name]  has presented today for surgery, with the diagnosis of cardimyopathy.  The various methods of treatment have been discussed with the patient and family. After consideration of risks, benefits and other options for treatment, the patient has consented to  Procedure(s): RIGHT/LEFT HEART CATH AND CORONARY ANGIOGRAPHY (N/A) as a surgical intervention.  The patient's history has been reviewed, patient examined, no change in status, stable for surgery.  I have reviewed the patient's chart and labs.  Questions were answered to the patient's satisfaction.     Adrian Prows

## 2022-03-18 ENCOUNTER — Institutional Professional Consult (permissible substitution) (INDEPENDENT_AMBULATORY_CARE_PROVIDER_SITE_OTHER): Payer: 59 | Admitting: Thoracic Surgery (Cardiothoracic Vascular Surgery)

## 2022-03-18 ENCOUNTER — Encounter: Payer: Self-pay | Admitting: Thoracic Surgery (Cardiothoracic Vascular Surgery)

## 2022-03-18 VITALS — BP 148/82 | HR 84 | Resp 18 | Ht 66.0 in | Wt 189.0 lb

## 2022-03-18 DIAGNOSIS — I351 Nonrheumatic aortic (valve) insufficiency: Secondary | ICD-10-CM

## 2022-03-18 NOTE — Progress Notes (Signed)
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 Pt has been doing fairly well after these issues and actually power lifts and aggressively using training bike. He reports over the past few months he has been more fatigued. He has no lower ext edema or lightheadedness. He has no palpitations or CP. He has been followed very closely by cardiology and has been having serial echos for known AI and ascending aortic aneurysm. He has recently been found to have worsening AI to know grade severe with dilation of the LV. He has mild Mr and mild to moderate TR. He as an EF of 45%. He was felt with increasing AI and dilation of LV with progressive symptoms to require AVR and was taken to the cath lab where he was found not to have PHTN however a proximal RCA lesion of over 70%. (Which I have personally reviewed and concur with findings). His aortic aneurysm has not been measured over 4.2 cm and has been confirmed by CT. Pt presents for discussion of surgical options moving forward with his cardiac issues. He does have Stage III Renal insufficiency in addition to above.       Past Medical History:  Diagnosis Date   ADHD     Anxiety     Arthritis     Asthma      excerise induced   Brain abscess     Chronic renal insufficiency, stage III (moderate) (HCC)     DVT (deep venous thrombosis) (HCC)     Esophageal reflux     History of osteomyelitis     History of transfusion of platelets     Hx of pulmonary embolus      x 2   Hyperlipidemia     Hypertension     Neuropathy     Patent foramen ovale     Seizures (HCC)     Sleep apnea      Sleep study done 5-6  years ago machine set at 10           Past  Surgical History:  Procedure Laterality Date   ANTERIOR CRUCIATE LIGAMENT REPAIR       BRAIN SURGERY        abscess drainage x 2   CARPAL TUNNEL RELEASE       CRANIOTOMY       CRANIOTOMY   03/30/2012    Procedure: CRANIOTOMY TUMOR EXCISION;  Surgeon: Kyle L Cabbell, MD;  Location: [facility];  Service: Neurosurgery;  Laterality: Right;   craniotomy for intracranial mass.   CRANIOTOMY   03/31/2012    Procedure: CRANIOTOMY TUMOR EXCISION;  Surgeon: Kyle L Cabbell, MD;  Location: [facility];  Service: Neurosurgery;  Laterality: Right;  Craniotomy for excision of mass   Deep Vein       FRACTURE SURGERY        right arm   LAPAROSCOPIC APPENDECTOMY   11/20/2011   LAPAROSCOPIC APPENDECTOMY   11/20/2011    Procedure: APPENDECTOMY LAPAROSCOPIC;  Surgeon: Douglas A Blackman, MD;  Location: [facility];  Service: General;  Laterality: N/A;   PATENT FORAMEN OVALE CLOSURE       Pulmonary Embolus         RIGHT/LEFT HEART CATH AND CORONARY ANGIOGRAPHY N/A 03/10/2022    Procedure: RIGHT/LEFT HEART CATH AND CORONARY ANGIOGRAPHY;  Surgeon: Ganji, Jay, MD;  Location: [facility];  Service: Cardiovascular;  Laterality: N/A;   ULNAR TUNNEL RELEASE               Family History  Problem Relation Age of Onset   Bladder Cancer Mother     Stroke Father     Thyroid disease Sister     Diabetes Brother     Allergies Other          whole family   Asthma Other          whole family      Social History Social History         Tobacco Use   Smoking status: Never   Smokeless tobacco: Never  Vaping Use   Vaping Use: Never used  Substance Use Topics   Alcohol use: No      Comment: quit 10 years ago   Drug use: No            Current Outpatient Medications  Medication Sig Dispense Refill   albuterol (PROVENTIL HFA;VENTOLIN HFA) 108 (90 BASE) MCG/ACT inhaler Inhale 2 puffs into the lungs every 6 (six) hours as needed for wheezing or shortness of breath.       azelastine (ASTELIN) 0.1 % nasal spray  Place 1 spray into both nostrils 2 (two) times daily.       escitalopram (LEXAPRO) 10 MG tablet Take 1 tablet by mouth daily.       fluticasone-salmeterol (ADVAIR HFA) 230-21 MCG/ACT inhaler Inhale 2 puffs into the lungs 2 (two) times daily.       irbesartan (AVAPRO) 150 MG tablet Take 150 mg by mouth daily.       levETIRAcetam (KEPPRA) 500 MG tablet Take 500 mg by mouth 2 (two) times daily.        levothyroxine (SYNTHROID) 50 MCG tablet TAKE 1 TABLET BY MOUTH  DAILY (Patient taking differently: Take 50-100 mcg by mouth See admin instructions. 50 mcg daily in the morning, alternating with 100 mcg every other day) 90 tablet 3   pantoprazole (PROTONIX) 40 MG tablet Take 40 mg by mouth every other day.       rosuvastatin (CRESTOR) 10 MG tablet TAKE 1 TABLET BY MOUTH  DAILY 90 tablet 3    No current facility-administered medications for this visit.           Allergies  Allergen Reactions   Iodinated Contrast Media Anaphylaxis   Iohexol Anaphylaxis and Shortness Of Breath       Code: HIVES, Desc: hives and tachycardia last time pt received IV CM kdean 07/19/06, Onset Date: [id number]   On 9/9 pt had a ct head w/ and received 13hr premedication. Was fine after scan, but broke out in hives when he got home.  Took 100mg benedryl at home PO, and was fine.     Armour Thyroid [Thyroid]        mouth swelling   Codeine        agitation   Dilantin [Phenytoin] Hives   Heparin Hives   Hydrocodone        Agitation   Nuvigil [Armodafinil]        rash   Other        Follows Kosher Diet; Needs Kosher products   Rocephin [Ceftriaxone] Other (See Comments)      rash   Flagyl [Metronidazole] Rash            Disease. Circulation. 2010; 121: O841-Y606. Aortic aneurysm NOS (ICD10-I71.9) 2. No acute findings. 3. Minor coronary artery calcifications and mild aortic atherosclerosis.   Aortic Atherosclerosis (ICD10-I70.0).  Right Left Heart Catheterization 03/10/22:  RA 7/5, mean 5 mmHg RV 39/5, EDP 10 mmHg PA 36/17, mean 26 mmHg. PW 21/21, mean 18 mmHg. QP/QS 1.0.  PVR 1.70 Wood units.   LV 155/13, EDP 30 mmHg. Ao 143/69, mean 100 mmHg.  There was no pressure gradient across the aortic valve.   RCA: Anterior origin.  Dominant and continues to the PDA gives origin to very small PL branches, proximal segment has a 70% stenosis which is focal.  Distal RCA has a tandem 30% stenosis.  Mild disease is present throughout the RCA. LM: Large caliber vessel.  No significant disease. LAD: Large vessel giving origin to a very large D1.  There is mild 20 to 30% stenosis in the proximal to mid segment of the LAD.  Mild disease in the ostium of the D1. LCx: Very large caliber vessel giving origin to a large OM1 which has secondary branches.  Circumflex continues in the AV groove, OM1 is large with mild disease.  Proximal segment has mild ectasia and a 10 to 20% stenosis.   Impression: Mild pulmonary hypertension secondary to elevated EDP, single-vessel coronary artery disease that is of significance involving the right coronary artery which is moderate-sized vessel.  Mild disease is evident in the LAD and CX.   Recommendation: We will discuss with surgical colleagues regarding aortic valve replacement and possible consideration of single-vessel bypass surgery to the right coronary artery versus  medical therapy for the same.  55 mL contrast utilized.   ECHO Findings:(02/04/22) 1. Mildly depressed LV systolic function with EF 46%. Left ventricle cavity is moderate to severely dilated. Moderate concentric hypertrophy of the left ventricle. Hypokinetic global wall motion. 2. Left atrial cavity is moderately dilated at 4.2 cm. 3. Right atrial cavity is normal in size. 4. Right ventricle cavity is normal in size. Normal right ventricular function. 5. Structurally normal trileaflet aortic valve. Moderate to severe aortic regurgitation. 6. Structurally normal mitral valve. Mild to moderate mitral regurgitation. 7. Structurally normal tricuspid valve. Moderate tricuspid regurgitation. Moderate to severe pulmonary hypertension. 8. Structurally normal pulmonic valve with trace regurgitation. 9. Pericardium is normal. No evidence of significant pericardial effusion. 10. The aortic root is normal. 11. Dilated pulmonary artery. 12. IVC is dilated with respiratory variation. 1. Mildly depressed LV systolic function with EF 46%. Left ventricle cavity is moderate to severely dilated. Moderate concentric hypertrophy of the left ventricle. Hypokinetic global wall motion. Calculated EF 46%. 2. Left atrial cavity is moderately dilated at 4.2 cm. 3. Structurally normal trileaflet aortic valve. Moderate to severe aortic regurgitation. 4. Structurally normal mitral valve. Mild to moderate mitral regurgitation. 5. Structurally normal tricuspid valve. Moderate tricuspid regurgitation. Moderate to severe pulmonary hypertension. RVSP measures 60 mmHg. 6. Dilated pulmonary artery. 7. IVC is dilated with respiratory variation. Conclusions: Detravion  Impression: Aortic Insufficiency  Pt with NYHA class II symptoms and with depressed LV function and dilation of ventrical, it is a class I indication to proceed with AVR. Pt and wife had an extensive counseling session of the prognosis and options  of therapy. Pt would desire a tissue valve replacement and understands that the lifespan of the valve to be in the 10-15 yr range with possibility of transcatheter therapies in the future if this valve should deteriorate. The understand all the risks and goals of surgery, the recovery  and the need to not lift over 10lbs for up to 6-8 weeks.   CAD  Pt with an RCA lesion of 70% that should be addressed with a SVG at the time of AVR.  Aortic Aneurysm  At 4.2 cm no indication for surgical intervention  Renal Insufficiency   Needs to be followed closely after CPB. Pt and wife understand this is a risk for worsening after surgery  Cerebral Abscess  No indication at present of causing increased risk but discussion with pt and wife that residual defect of hand weakness may worsen temporarily as swelling occurs after CPB.  Plan: Elective AVR and CAB x 1(SVG to RCA)  70 min was spent in reviewing records, viewing studies, discussion with pt and wife the complexities of the upcomming surgery with risks and recovery expectations reviewed and in also chart preperation   Coralie Common, MD [facility] [phone]

## 2022-03-19 ENCOUNTER — Encounter: Payer: Self-pay | Admitting: *Deleted

## 2022-03-19 ENCOUNTER — Encounter: Payer: Self-pay | Admitting: Cardiology

## 2022-03-19 ENCOUNTER — Other Ambulatory Visit: Payer: Self-pay | Admitting: *Deleted

## 2022-03-19 DIAGNOSIS — I351 Nonrheumatic aortic (valve) insufficiency: Secondary | ICD-10-CM

## 2022-03-19 DIAGNOSIS — I2510 Atherosclerotic heart disease of native coronary artery without angina pectoris: Secondary | ICD-10-CM

## 2022-03-20 ENCOUNTER — Encounter: Payer: Self-pay | Admitting: *Deleted

## 2022-03-20 NOTE — Telephone Encounter (Signed)
From patient.

## 2022-03-26 ENCOUNTER — Ambulatory Visit: Payer: No Typology Code available for payment source | Admitting: Cardiology

## 2022-04-17 NOTE — Pre-Procedure Instructions (Signed)
Surgical Instructions    Your procedure is scheduled on April 22, 2022.  Report to [facility] Main Entrance "A" at 6:30 A.M., then check in with the Admitting office.  Call this number if you have problems the morning of surgery:  [phone]   If you have any questions prior to your surgery date call [phone]: Open Monday-Friday 8am-4pm    Remember:  Do not eat or drink after midnight the night before your surgery      Take these medicines the morning of surgery with A SIP OF WATER:  escitalopram (LEXAPRO)  fluticasone-salmeterol (ADVAIR HFA) inhaler  levETIRAcetam (KEPPRA)  levothyroxine (SYNTHROID)   pantoprazole (PROTONIX)   rosuvastatin (CRESTOR)    Take these medicines the morning of surgery with a sip of water AS NEEDED:  albuterol (PROVENTIL HFA;VENTOLIN HFA) inhaler  azelastine (ASTELIN) nasal spray    As of today, STOP taking any Aspirin (unless otherwise instructed by your surgeon) Aleve, Naproxen, Ibuprofen, Motrin, Advil, Goody's, BC's, all herbal medications, fish oil, and all vitamins.                     Do NOT Smoke (Tobacco/Vaping) for 24 hours prior to your procedure.  If you use a CPAP at night, you may bring your mask/headgear for your overnight stay.   Contacts, glasses, piercing's, hearing aid's, dentures or partials may not be worn into surgery, please bring cases for these belongings.    For patients admitted to the hospital, discharge time will be determined by your treatment team.   Patients discharged the day of surgery will not be allowed to drive home, and someone needs to stay with them for 24 hours.  SURGICAL WAITING ROOM VISITATION Patients having surgery or a procedure may have no more than 2 support people in the waiting area - these visitors may rotate.   Children under the age of 103 must have an adult with them who is not the patient. If the patient needs to stay at the hospital during part of their recovery, the visitor  guidelines for inpatient rooms apply. Pre-op nurse will coordinate an appropriate time for 1 support person to accompany patient in pre-op.  This support person may not rotate.   Please refer to the [facility] website for the visitor guidelines for Inpatients (after your surgery is over and you are in a regular room).    Special instructions:   [place]- Preparing For Surgery  Before surgery, you can play an important role. Because skin is not sterile, your skin needs to be as free of germs as possible. You can reduce the number of germs on your skin by washing with CHG (chlorahexidine gluconate) Soap before surgery.  CHG is an antiseptic cleaner which kills germs and bonds with the skin to continue killing germs even after washing.    Oral Hygiene is also important to reduce your risk of infection.  Remember - BRUSH YOUR TEETH THE MORNING OF SURGERY WITH YOUR REGULAR TOOTHPASTE  Please do not use if you have an allergy to CHG or antibacterial soaps. If your skin becomes reddened/irritated stop using the CHG.  Do not shave (including legs and underarms) for at least 48 hours prior to first CHG shower. It is OK to shave your face.  Please follow these instructions carefully.   Shower the NIGHT BEFORE SURGERY and the MORNING OF SURGERY  If you chose to wash your hair, wash your hair first as usual with your normal shampoo.  After  you shampoo, rinse your hair and body thoroughly to remove the shampoo.  Use CHG Soap as you would any other liquid soap. You can apply CHG directly to the skin and wash gently with a scrungie or a clean washcloth.   Apply the CHG Soap to your body ONLY FROM THE NECK DOWN.  Do not use on open wounds or open sores. Avoid contact with your eyes, ears, mouth and genitals (private parts). Wash Face and genitals (private parts)  with your normal soap.   Wash thoroughly, paying special attention to the area where your surgery will be performed.  Thoroughly rinse  your body with warm water from the neck down.  DO NOT shower/wash with your normal soap after using and rinsing off the CHG Soap.  Pat yourself dry with a CLEAN TOWEL.  Wear CLEAN PAJAMAS to bed the night before surgery  Place CLEAN SHEETS on your bed the night before your surgery  DO NOT SLEEP WITH PETS.   Day of Surgery: Take a shower with CHG soap. Do not wear jewelry or makeup Do not wear lotions, powders, colognes, or deodorant. Do not shave 48 hours prior to surgery.  Men may shave face and neck. Do not bring valuables to the hospital.  [facility] is not responsible for any belongings or valuables. Do not wear nail polish, gel polish, artificial nails, or any other type of covering on natural nails (fingers and toes) If you have artificial nails or gel coating that need to be removed by a nail salon, please have this removed prior to surgery. Artificial nails or gel coating may interfere with anesthesia's ability to adequately monitor your vital signs. Wear Clean/Comfortable clothing the morning of surgery Remember to brush your teeth WITH YOUR REGULAR TOOTHPASTE.   Please read over the following fact sheets that you were given.    If you received a COVID test during your pre-op visit  it is requested that you wear a mask when out in public, stay away from anyone that may not be feeling well and notify your surgeon if you develop symptoms. If you have been in contact with anyone that has tested positive in the last 10 days please notify you surgeon.

## 2022-04-20 ENCOUNTER — Encounter (HOSPITAL_COMMUNITY)
Admission: RE | Admit: 2022-04-20 | Discharge: 2022-04-20 | Disposition: A | Discharge status: Home / Self Care | Payer: 59 | Source: Ambulatory Visit | Attending: Thoracic Surgery (Cardiothoracic Vascular Surgery) | Admitting: Thoracic Surgery (Cardiothoracic Vascular Surgery)

## 2022-04-20 ENCOUNTER — Encounter (HOSPITAL_COMMUNITY): Payer: Self-pay

## 2022-04-20 ENCOUNTER — Other Ambulatory Visit: Payer: Self-pay

## 2022-04-20 ENCOUNTER — Ambulatory Visit (HOSPITAL_COMMUNITY)
Admission: RE | Admit: 2022-04-20 | Discharge: 2022-04-20 | Disposition: A | Discharge status: Home / Self Care | Payer: No Typology Code available for payment source | Source: Ambulatory Visit | Attending: Thoracic Surgery (Cardiothoracic Vascular Surgery) | Admitting: Thoracic Surgery (Cardiothoracic Vascular Surgery)

## 2022-04-20 VITALS — BP 148/86 | HR 85 | Temp 97.7°F | Resp 18 | Ht 66.0 in | Wt 188.9 lb

## 2022-04-20 DIAGNOSIS — I350 Nonrheumatic aortic (valve) stenosis: Secondary | ICD-10-CM | POA: Insufficient documentation

## 2022-04-20 DIAGNOSIS — Z01818 Encounter for other preprocedural examination: Secondary | ICD-10-CM | POA: Insufficient documentation

## 2022-04-20 DIAGNOSIS — I351 Nonrheumatic aortic (valve) insufficiency: Secondary | ICD-10-CM

## 2022-04-20 DIAGNOSIS — Z1152 Encounter for screening for COVID-19: Secondary | ICD-10-CM | POA: Insufficient documentation

## 2022-04-20 DIAGNOSIS — I2510 Atherosclerotic heart disease of native coronary artery without angina pectoris: Secondary | ICD-10-CM

## 2022-04-20 HISTORY — DX: Hypothyroidism, unspecified: E03.9

## 2022-04-20 HISTORY — DX: Atherosclerotic heart disease of native coronary artery without angina pectoris: I25.10

## 2022-04-20 HISTORY — DX: Angina pectoris, unspecified: I20.9

## 2022-04-20 LAB — COMPREHENSIVE METABOLIC PANEL WITH GFR
ALT: 17 U/L (ref 0–44)
AST: 22 U/L (ref 15–41)
Albumin: 4.1 g/dL (ref 3.5–5.0)
Alkaline Phosphatase: 54 U/L (ref 38–126)
Anion gap: 7 (ref 5–15)
BUN: 17 mg/dL (ref 8–23)
CO2: 23 mmol/L (ref 22–32)
Calcium: 9.2 mg/dL (ref 8.9–10.3)
Chloride: 109 mmol/L (ref 98–111)
Creatinine, Ser: 1.26 mg/dL — ABNORMAL HIGH (ref 0.61–1.24)
GFR, Estimated: >60 mL/min
Glucose, Bld: 113 mg/dL — ABNORMAL HIGH (ref 70–99)
Potassium: 3.8 mmol/L (ref 3.5–5.1)
Sodium: 139 mmol/L (ref 135–145)
Total Bilirubin: 0.8 mg/dL (ref 0.3–1.2)
Total Protein: 6.9 g/dL (ref 6.5–8.1)

## 2022-04-20 LAB — CBC
HCT: 43.6 % (ref 39.0–52.0)
Hemoglobin: 14.7 g/dL (ref 13.0–17.0)
MCH: 30.8 pg (ref 26.0–34.0)
MCHC: 33.7 g/dL (ref 30.0–36.0)
MCV: 91.2 fL (ref 80.0–100.0)
Platelets: 249 K/uL (ref 150–400)
RBC: 4.78 MIL/uL (ref 4.22–5.81)
RDW: 12.7 % (ref 11.5–15.5)
WBC: 8.2 K/uL (ref 4.0–10.5)
nRBC: 0 % (ref 0.0–0.2)

## 2022-04-20 LAB — URINALYSIS, ROUTINE W REFLEX MICROSCOPIC
Bilirubin Urine: NEGATIVE
Glucose, UA: NEGATIVE mg/dL
Hgb urine dipstick: NEGATIVE
Ketones, ur: NEGATIVE mg/dL
Leukocytes,Ua: NEGATIVE
Nitrite: NEGATIVE
Protein, ur: NEGATIVE mg/dL
Specific Gravity, Urine: 1.005 (ref 1.005–1.030)
pH: 6 (ref 5.0–8.0)

## 2022-04-20 LAB — BLOOD GAS, ARTERIAL
Acid-Base Excess: 2.4 mmol/L — ABNORMAL HIGH (ref 0.0–2.0)
Bicarbonate: 26.4 mmol/L (ref 20.0–28.0)
Drawn by: 6643
O2 Saturation: 98 %
Patient temperature: 37
pCO2 arterial: 38 mmHg (ref 32–48)
pH, Arterial: 7.45 (ref 7.35–7.45)
pO2, Arterial: 102 mmHg (ref 83–108)

## 2022-04-20 LAB — TYPE AND SCREEN
ABO/RH(D): B POS
Antibody Screen: NEGATIVE

## 2022-04-20 LAB — PROTIME-INR
INR: 1.1 (ref 0.8–1.2)
Prothrombin Time: 14.2 s (ref 11.4–15.2)

## 2022-04-20 LAB — SURGICAL PCR SCREEN
MRSA, PCR: NEGATIVE
Staphylococcus aureus: NEGATIVE

## 2022-04-20 LAB — HEMOGLOBIN A1C
Hgb A1c MFr Bld: 5.2 % (ref 4.8–5.6)
Mean Plasma Glucose: 102.54 mg/dL

## 2022-04-20 LAB — APTT: aPTT: 30 s (ref 24–36)

## 2022-04-20 NOTE — Progress Notes (Signed)
PCP - Theadore Nan, MD Cardiologist - Dr. Adrian Prows  PPM/ICD - n/a  Chest x-ray - 04/20/22 EKG - 04/20/22 Stress Test - 10/07/20 ECHO - 02/04/22 Cardiac Cath - 03/10/22  Sleep Study - OSA+ CPAP - uses nightly  Last dose of GLP1 agonist- n/a GLP1 instructions: n/a  Blood Thinner Instructions: n/a Aspirin Instructions: n/a  NPO  COVID TEST- 04/20/22  Anesthesia review: Yes, hx of CAD.  Patient denies shortness of breath, fever, cough and chest pain at PAT appointment   All instructions explained to the patient, with a verbal understanding of the material. Patient agrees to go over the instructions while at home for a better understanding. Patient also instructed to self quarantine after being tested for COVID-19. The opportunity to ask questions was provided.

## 2022-04-20 NOTE — Pre-Procedure Instructions (Signed)
Surgical Instructions    Your procedure is scheduled on April 22, 2022.  Report to [facility] Main Entrance "A" at 6:30 A.M., then check in with the Admitting office.  Call this number if you have problems the morning of surgery:  [phone]   If you have any questions prior to your surgery date call [phone]: Open Monday-Friday 8am-4pm    Remember:  Do not eat or drink after midnight the night before your surgery      Take these medicines the morning of surgery with A SIP OF WATER:  escitalopram (LEXAPRO)  fluticasone-salmeterol (ADVAIR HFA) inhaler  levETIRAcetam (KEPPRA)  levothyroxine (SYNTHROID)   pantoprazole (PROTONIX)   rosuvastatin (CRESTOR)    Take these medicines the morning of surgery with a sip of water AS NEEDED:  albuterol (PROVENTIL HFA;VENTOLIN HFA) inhaler  azelastine (ASTELIN) nasal spray  Please bring all inhalers with you the day of surgery.   As of today, STOP taking any Aspirin (unless otherwise instructed by your surgeon) Aleve, Naproxen, Ibuprofen, Motrin, Advil, Goody's, BC's, all herbal medications, fish oil, and all vitamins.                     Do NOT Smoke (Tobacco/Vaping) for 24 hours prior to your procedure.  If you use a CPAP at night, you may bring your mask/headgear for your overnight stay.   Contacts, glasses, piercing's, hearing aid's, dentures or partials may not be worn into surgery, please bring cases for these belongings.    For patients admitted to the hospital, discharge time will be determined by your treatment team.   Patients discharged the day of surgery will not be allowed to drive home, and someone needs to stay with them for 24 hours.  SURGICAL WAITING ROOM VISITATION Patients having surgery or a procedure may have no more than 2 support people in the waiting area - these visitors may rotate.   Children under the age of 3 must have an adult with them who is not the patient. If the patient needs to stay at the  hospital during part of their recovery, the visitor guidelines for inpatient rooms apply. Pre-op nurse will coordinate an appropriate time for 1 support person to accompany patient in pre-op.  This support person may not rotate.   Please refer to the [facility] website for the visitor guidelines for Inpatients (after your surgery is over and you are in a regular room).    Special instructions:   [place]- Preparing For Surgery  Before surgery, you can play an important role. Because skin is not sterile, your skin needs to be as free of germs as possible. You can reduce the number of germs on your skin by washing with CHG (chlorahexidine gluconate) Soap before surgery.  CHG is an antiseptic cleaner which kills germs and bonds with the skin to continue killing germs even after washing.    Oral Hygiene is also important to reduce your risk of infection.  Remember - BRUSH YOUR TEETH THE MORNING OF SURGERY WITH YOUR REGULAR TOOTHPASTE  Please do not use if you have an allergy to CHG or antibacterial soaps. If your skin becomes reddened/irritated stop using the CHG.  Do not shave (including legs and underarms) for at least 48 hours prior to first CHG shower. It is OK to shave your face.  Please follow these instructions carefully.   Shower the NIGHT BEFORE SURGERY and the MORNING OF SURGERY  If you chose to wash your hair, wash your  hair first as usual with your normal shampoo.  After you shampoo, rinse your hair and body thoroughly to remove the shampoo.  Use CHG Soap as you would any other liquid soap. You can apply CHG directly to the skin and wash gently with a scrungie or a clean washcloth.   Apply the CHG Soap to your body ONLY FROM THE NECK DOWN.  Do not use on open wounds or open sores. Avoid contact with your eyes, ears, mouth and genitals (private parts). Wash Face and genitals (private parts)  with your normal soap.   Wash thoroughly, paying special attention to the area where  your surgery will be performed.  Thoroughly rinse your body with warm water from the neck down.  DO NOT shower/wash with your normal soap after using and rinsing off the CHG Soap.  Pat yourself dry with a CLEAN TOWEL.  Wear CLEAN PAJAMAS to bed the night before surgery  Place CLEAN SHEETS on your bed the night before your surgery  DO NOT SLEEP WITH PETS.   Day of Surgery: Take a shower with CHG soap. Do not wear jewelry Do not wear lotions, powders, colognes, or deodorant. Men may shave face and neck. Do not bring valuables to the hospital.  [facility] is not responsible for any belongings or valuables. Wear Clean/Comfortable clothing the morning of surgery Remember to brush your teeth WITH YOUR REGULAR TOOTHPASTE.   Please read over the following fact sheets that you were given.    If you received a COVID test during your pre-op visit  it is requested that you wear a mask when out in public, stay away from anyone that may not be feeling well and notify your surgeon if you develop symptoms. If you have been in contact with anyone that has tested positive in the last 10 days please notify you surgeon.

## 2022-04-21 LAB — SARS CORONAVIRUS 2 (TAT 6-24 HRS): SARS Coronavirus 2: NEGATIVE

## 2022-04-21 MED ORDER — POTASSIUM CHLORIDE 2 MEQ/ML IV SOLN
80.0000 meq | INTRAVENOUS | Status: DC
  Filled 2022-04-21: qty 40

## 2022-04-21 MED ORDER — TRANEXAMIC ACID 1000 MG/10ML IV SOLN
1.5000 mg/kg/h | INTRAVENOUS | Status: AC
  Administered 2022-04-22: 1.5 mg/kg/h via INTRAVENOUS
  Filled 2022-04-21: qty 25

## 2022-04-21 MED ORDER — EPINEPHRINE HCL 5 MG/250ML IV SOLN IN NS
0.0000 ug/min | INTRAVENOUS | Status: DC
  Filled 2022-04-21: qty 250

## 2022-04-21 MED ORDER — MANNITOL 20 % IV SOLN
INTRAVENOUS | Status: DC
  Filled 2022-04-21: qty 13

## 2022-04-21 MED ORDER — VANCOMYCIN HCL 1000 MG IV SOLR
INTRAVENOUS | Status: DC
  Filled 2022-04-21: qty 20

## 2022-04-21 MED ORDER — PLASMA-LYTE A IV SOLN
INTRAVENOUS | Status: DC
  Filled 2022-04-21: qty 2.5

## 2022-04-21 MED ORDER — PHENYLEPHRINE HCL-NACL 20-0.9 MG/250ML-% IV SOLN
30.0000 ug/min | INTRAVENOUS | Status: AC
  Administered 2022-04-22: 25 ug/min via INTRAVENOUS
  Filled 2022-04-21: qty 250

## 2022-04-21 MED ORDER — NOREPINEPHRINE 4 MG/250ML-% IV SOLN
0.0000 ug/min | INTRAVENOUS | Status: DC
  Filled 2022-04-21: qty 250

## 2022-04-21 MED ORDER — DEXMEDETOMIDINE HCL IN NACL 400 MCG/100ML IV SOLN
0.1000 ug/kg/h | INTRAVENOUS | Status: AC
  Administered 2022-04-22: .7 ug/kg/h via INTRAVENOUS
  Filled 2022-04-21: qty 100

## 2022-04-21 MED ORDER — TRANEXAMIC ACID (OHS) PUMP PRIME SOLUTION
2.0000 mg/kg | INTRAVENOUS | Status: DC
  Filled 2022-04-21: qty 1.71

## 2022-04-21 MED ORDER — HEPARIN 30,000 UNITS/1000 ML (OHS) CELLSAVER SOLUTION
Status: DC
  Filled 2022-04-21: qty 1000

## 2022-04-21 MED ORDER — VANCOMYCIN HCL 1500 MG/300ML IV SOLN
1500.0000 mg | INTRAVENOUS | Status: AC
  Administered 2022-04-22: 1500 mg via INTRAVENOUS
  Filled 2022-04-21: qty 300

## 2022-04-21 MED ORDER — TRANEXAMIC ACID (OHS) BOLUS VIA INFUSION
15.0000 mg/kg | INTRAVENOUS | Status: AC
  Administered 2022-04-22: 1285.5 mg via INTRAVENOUS
  Filled 2022-04-21: qty 1286

## 2022-04-21 MED ORDER — MILRINONE LACTATE IN DEXTROSE 20-5 MG/100ML-% IV SOLN
0.3000 ug/kg/min | INTRAVENOUS | Status: DC
  Filled 2022-04-21: qty 100

## 2022-04-21 MED ORDER — INSULIN REGULAR(HUMAN) IN NACL 100-0.9 UT/100ML-% IV SOLN
INTRAVENOUS | Status: AC
  Administered 2022-04-22: 1.7 [IU]/h via INTRAVENOUS
  Filled 2022-04-21: qty 100

## 2022-04-21 MED ORDER — CEFAZOLIN SODIUM-DEXTROSE 2-4 GM/100ML-% IV SOLN
2.0000 g | INTRAVENOUS | Status: DC
  Filled 2022-04-21: qty 100

## 2022-04-21 MED ORDER — CEFAZOLIN SODIUM-DEXTROSE 2-4 GM/100ML-% IV SOLN
2.0000 g | INTRAVENOUS | Status: AC
  Administered 2022-04-22 (×2): 2 g via INTRAVENOUS
  Filled 2022-04-21: qty 100

## 2022-04-21 NOTE — Anesthesia Preprocedure Evaluation (Signed)
Anesthesia Evaluation  Patient identified by MRN, date of birth, ID band Patient awake    Reviewed: Allergy & Precautions, NPO status , Patient's Chart, lab work & pertinent test results  History of Anesthesia Complications Negative for: history of anesthetic complications  Airway Mallampati: III  TM Distance: >3 FB Neck ROM: Full    Dental  (+) Dental Advisory Given, Teeth Intact   Pulmonary asthma , sleep apnea and Continuous Positive Airway Pressure Ventilation , PE   Pulmonary exam normal        Cardiovascular hypertension, Pt. on medications pulmonary hypertension+ CAD, +CHF and + DVT  + Valvular Problems/Murmurs AI and MR  Rhythm:Regular Rate:Normal + Diastolic murmurs  '23 Cath - Mild pulmonary hypertension secondary to elevated EDP, single-vessel coronary artery disease that is of significance involving the right coronary artery which is moderate-sized vessel.  Mild disease is evident in the LAD and CX.  '23 TTE - EF 46%. Left ventricle cavity is moderate to severely dilated. Moderate concentric hypertrophy of the  left ventricle. Hypokinetic global wall motion. Left atrial cavity is moderately dilated at 4.2 cm. Moderate to severe aortic regurgitation. Mild to moderate mitral regurgitation. Moderate tricuspid regurgitation. Moderate to severe pulmonary hypertension. RVSP measures 60 mmHg.  Dilated pulmonary artery.     Neuro/Psych Seizures -, Well Controlled,  PSYCHIATRIC DISORDERS Anxiety  Cerebral mass s/p craniotomy      GI/Hepatic Neg liver ROS, GERD  Medicated and Controlled,  Endo/Other  Hypothyroidism  Obesity   Renal/GU CRFRenal disease     Musculoskeletal  (+) Arthritis ,   Abdominal   Peds  Hematology   Anesthesia Other Findings   Reproductive/Obstetrics                            Anesthesia Physical Anesthesia Plan  ASA: 4  Anesthesia Plan: General   Post-op  Pain Management:    Induction: Intravenous  PONV Risk Score and Plan: 2 and Treatment may vary due to age or medical condition  Airway Management Planned: Oral ETT  Additional Equipment: Arterial line, CVP, PA Cath, TEE and Ultrasound Guidance Line Placement  Intra-op Plan:   Post-operative Plan: Post-operative intubation/ventilation  Informed Consent: I have reviewed the patients History and Physical, chart, labs and discussed the procedure including the risks, benefits and alternatives for the proposed anesthesia with the patient or authorized representative who has indicated his/her understanding and acceptance.     Dental advisory given  Plan Discussed with: CRNA and Anesthesiologist  Anesthesia Plan Comments:        Anesthesia Quick Evaluation

## 2022-04-21 NOTE — H&P (Signed)
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 Pt has been doing fairly well after these issues and actually power lifts and aggressively using training bike. He reports over the past few months he has been more fatigued. He has no lower ext edema or lightheadedness. He has no palpitations or CP. He has been followed very closely by cardiology and has been having serial echos for known AI and ascending aortic aneurysm. He has recently been found to have worsening AI to know grade severe with dilation of the LV. He has mild Mr and mild to moderate TR. He as an EF of 45%. He was felt with increasing AI and dilation of LV with progressive symptoms to require AVR and was taken to the cath lab where he was found not to have PHTN however a proximal RCA lesion of over 70%. (Which I have personally reviewed and concur with findings). His aortic aneurysm has not been measured over 4.2 cm and has been confirmed by CT. Pt presents for discussion of surgical options moving forward with his cardiac issues. He does have Stage III Renal insufficiency in addition to above.       Past Medical History:  Diagnosis Date   ADHD     Anxiety     Arthritis     Asthma      excerise induced   Brain abscess     Chronic renal insufficiency, stage III (moderate) (HCC)     DVT (deep venous thrombosis) (HCC)     Esophageal reflux     History of osteomyelitis     History of transfusion of platelets     Hx of pulmonary embolus      x 2   Hyperlipidemia     Hypertension     Neuropathy     Patent foramen ovale     Seizures (HCC)     Sleep apnea      Sleep study done 5-6  years ago machine set at 10           Past  Surgical History:  Procedure Laterality Date   ANTERIOR CRUCIATE LIGAMENT REPAIR       BRAIN SURGERY        abscess drainage x 2   CARPAL TUNNEL RELEASE       CRANIOTOMY       CRANIOTOMY   03/30/2012    Procedure: CRANIOTOMY TUMOR EXCISION;  Surgeon: Winfield Cunas, MD;  Location: [facility];  Service: Neurosurgery;  Laterality: Right;   craniotomy for intracranial mass.   CRANIOTOMY   03/31/2012    Procedure: CRANIOTOMY TUMOR EXCISION;  Surgeon: Winfield Cunas, MD;  Location: [facility];  Service: Neurosurgery;  Laterality: Right;  Craniotomy for excision of mass   Deep Vein       FRACTURE SURGERY        right arm   LAPAROSCOPIC APPENDECTOMY   11/20/2011   LAPAROSCOPIC APPENDECTOMY   11/20/2011    Procedure: APPENDECTOMY LAPAROSCOPIC;  Surgeon: Harl Bowie, MD;  Location: [facility];  Service: General;  Laterality: N/A;   PATENT FORAMEN OVALE CLOSURE       Pulmonary Embolus  RIGHT/LEFT HEART CATH AND CORONARY ANGIOGRAPHY N/A 03/10/2022    Procedure: RIGHT/LEFT HEART CATH AND CORONARY ANGIOGRAPHY;  Surgeon: Adrian Prows, MD;  Location: [place] [facility] [facility];  Service: Cardiovascular;  Laterality: N/A;   ULNAR TUNNEL RELEASE               Family History  Problem Relation Age of Onset   Bladder Cancer Mother     Stroke Father     Thyroid disease Sister     Diabetes Brother     Allergies Other          whole family   Asthma Other          whole family      Social History Social History         Tobacco Use   Smoking status: Never   Smokeless tobacco: Never  Vaping Use   Vaping Use: Never used  Substance Use Topics   Alcohol use: No      Comment: quit 10 years ago   Drug use: No            Current Outpatient Medications  Medication Sig Dispense Refill   albuterol (PROVENTIL HFA;VENTOLIN HFA) 108 (90 BASE) MCG/ACT inhaler Inhale 2 puffs into the lungs every 6 (six) hours as needed for wheezing or shortness of breath.       azelastine (ASTELIN) 0.1 % nasal spray  Place 1 spray into both nostrils 2 (two) times daily.       escitalopram (LEXAPRO) 10 MG tablet Take 1 tablet by mouth daily.       fluticasone-salmeterol (ADVAIR HFA) 230-21 MCG/ACT inhaler Inhale 2 puffs into the lungs 2 (two) times daily.       irbesartan (AVAPRO) 150 MG tablet Take 150 mg by mouth daily.       levETIRAcetam (KEPPRA) 500 MG tablet Take 500 mg by mouth 2 (two) times daily.        levothyroxine (SYNTHROID) 50 MCG tablet TAKE 1 TABLET BY MOUTH  DAILY (Patient taking differently: Take 50-100 mcg by mouth See admin instructions. 50 mcg daily in the morning, alternating with 100 mcg every other day) 90 tablet 3   pantoprazole (PROTONIX) 40 MG tablet Take 40 mg by mouth every other day.       rosuvastatin (CRESTOR) 10 MG tablet TAKE 1 TABLET BY MOUTH  DAILY 90 tablet 3    No current facility-administered medications for this visit.           Allergies  Allergen Reactions   Iodinated Contrast Media Anaphylaxis   Iohexol Anaphylaxis and Shortness Of Breath       Code: HIVES, Desc: hives and tachycardia last time pt received IV CM kdean 07/19/06, Onset Date: [id number]   On 9/9 pt had a ct head w/ and received 13hr premedication. Was fine after scan, but broke out in hives when he got home.  Took 110m benedryl at home PO, and was fine.     [place] Thyroid [Thyroid]        mouth swelling   Codeine        agitation   Dilantin [Phenytoin] Hives   Heparin Hives   Hydrocodone        Agitation   Nuvigil [Armodafinil]        rash   Other        Follows Kosher Diet; Needs Kosher products   Rocephin [Ceftriaxone] Other (See Comments)      rash   Flagyl [Metronidazole] Rash  BP (!) 148/82 (BP Location: Left Arm, Patient Position: Sitting)   Pulse 84   Resp 18   Ht _0  (1.676 m)   Wt 189 lb (85.7 kg)   SpO2 96% Comment: RA  BMI 30.51 kg/m  Physical Exam: A directed physical exam reveals: Head and Neck: intact Heart: 3/6 diastolic murmur with soft systolic  component Legs: no vericosities   Diagnostic Tests:     Latest Reference Range & Units 03/03/22 08:12  eGFR >59 mL/min/1.73 54 (L)    CT SCAN IMPRESSION:(12/25/21) 1. 4.2 cm ascending thoracic aortic aneurysm, stable from the cardiac CT performed on 08/21/2020. Recommend annual imaging followup by CTA or MRA. This recommendation follows 2010 ACCF/AHA/AATS/ACR/ASA/SCA/SCAI/SIR/STS/SVM Guidelines for the Diagnosis and Management of Patients with Thoracic Aortic Disease. Circulation. 2010; 121: U235-T614. Aortic aneurysm NOS (ICD10-I71.9) 2. No acute findings. 3. Minor coronary artery calcifications and mild aortic atherosclerosis.   Aortic Atherosclerosis (ICD10-I70.0).   Right Left Heart Catheterization 03/10/22:  RA 7/5, mean 5 mmHg RV 39/5, EDP 10 mmHg PA 36/17, mean 26 mmHg. PW 21/21, mean 18 mmHg. QP/QS 1.0.  PVR 1.70 Wood units.   LV 155/13, EDP 30 mmHg. Ao 143/69, mean 100 mmHg.  There was no pressure gradient across the aortic valve.   RCA: Anterior origin.  Dominant and continues to the PDA gives origin to very small PL branches, proximal segment has a 70% stenosis which is focal.  Distal RCA has a tandem 30% stenosis.  Mild disease is present throughout the RCA. LM: Large caliber vessel.  No significant disease. LAD: Large vessel giving origin to a very large D1.  There is mild 20 to 30% stenosis in the proximal to mid segment of the LAD.  Mild disease in the ostium of the D1. LCx: Very large caliber vessel giving origin to a large OM1 which has secondary branches.  Circumflex continues in the AV groove, OM1 is large with mild disease.  Proximal segment has mild ectasia and a 10 to 20% stenosis.   Impression: Mild pulmonary hypertension secondary to elevated EDP, single-vessel coronary artery disease that is of significance involving the right coronary artery which is moderate-sized vessel.  Mild disease is evident in the LAD and CX.   Recommendation: We will discuss with  surgical colleagues regarding aortic valve replacement and possible consideration of single-vessel bypass surgery to the right coronary artery versus medical therapy for the same.  55 mL contrast utilized.     ECHO Findings:(02/04/22) 1. Mildly depressed LV systolic function with EF 46%. Left ventricle cavity is moderate to severely dilated. Moderate concentric hypertrophy of the left ventricle. Hypokinetic global wall motion. 2. Left atrial cavity is moderately dilated at 4.2 cm. 3. Right atrial cavity is normal in size. 4. Right ventricle cavity is normal in size. Normal right ventricular function. 5. Structurally normal trileaflet aortic valve. Moderate to severe aortic regurgitation. 6. Structurally normal mitral valve. Mild to moderate mitral regurgitation. 7. Structurally normal tricuspid valve. Moderate tricuspid regurgitation. Moderate to severe pulmonary hypertension. 8. Structurally normal pulmonic valve with trace regurgitation. 9. Pericardium is normal. No evidence of significant pericardial effusion. 10. The aortic root is normal. 11. Dilated pulmonary artery. 12. IVC is dilated with respiratory variation. 1. Mildly depressed LV systolic function with EF 46%. Left ventricle cavity is moderate to severely dilated. Moderate concentric hypertrophy of the left ventricle. Hypokinetic global wall motion. Calculated EF 46%. 2. Left atrial cavity is moderately dilated at 4.2 cm. 3. Structurally normal trileaflet aortic valve. Moderate to  severe aortic regurgitation. 4. Structurally normal mitral valve. Mild to moderate mitral regurgitation. 5. Structurally normal tricuspid valve. Moderate tricuspid regurgitation. Moderate to severe pulmonary hypertension. RVSP measures 60 mmHg. 6. Dilated pulmonary artery. 7. IVC is dilated with respiratory variation. Conclusions: [place]   Impression: Aortic Insufficiency  Pt with NYHA class II symptoms and with depressed LV function  and dilation of ventrical, it is a class I indication to proceed with AVR. Pt and wife had an extensive counseling session of the prognosis and options of therapy. Pt would desire a tissue valve replacement and understands that the lifespan of the valve to be in the 10-15 yr range with possibility of transcatheter therapies in the future if this valve should deteriorate. The understand all the risks and goals of surgery, the recovery and the need to not lift over 10lbs for up to 6-8 weeks.    CAD  Pt with an RCA lesion of 70% that should be addressed with a SVG at the time of AVR.   Aortic Aneurysm  At 4.2 cm no indication for surgical intervention   Renal Insufficiency   Needs to be followed closely after CPB. Pt and wife understand this is a risk for worsening after surgery   Cerebral Abscess  No indication at present of causing increased risk but discussion with pt and wife that residual defect of hand weakness may worsen temporarily as swelling occurs after CPB.   Plan: Elective AVR and CAB x 1(SVG to RCA)   70 min was spent in reviewing records, viewing studies, discussion with pt and wife the complexities of the upcomming surgery with risks and recovery expectations reviewed and in also chart preperation     Coralie Common, MD [facility] [phone]

## 2022-04-22 ENCOUNTER — Inpatient Hospital Stay (HOSPITAL_COMMUNITY): Payer: 59 | Admitting: Vascular Surgery

## 2022-04-22 ENCOUNTER — Inpatient Hospital Stay (HOSPITAL_COMMUNITY)
Admission: RE | Admit: 2022-04-22 | Discharge: 2022-05-04 | DRG: 219 | Disposition: A | Discharge status: Home / Self Care | Payer: 59 | Attending: Thoracic Surgery (Cardiothoracic Vascular Surgery) | Admitting: Thoracic Surgery (Cardiothoracic Vascular Surgery)

## 2022-04-22 ENCOUNTER — Encounter (HOSPITAL_COMMUNITY)
Admission: RE | Disposition: A | Discharge status: Home / Self Care | Payer: Self-pay | Source: Home / Self Care | Attending: Thoracic Surgery (Cardiothoracic Vascular Surgery)

## 2022-04-22 ENCOUNTER — Other Ambulatory Visit: Payer: Self-pay

## 2022-04-22 ENCOUNTER — Inpatient Hospital Stay (HOSPITAL_COMMUNITY): Payer: 59

## 2022-04-22 ENCOUNTER — Encounter (HOSPITAL_COMMUNITY): Payer: Self-pay | Admitting: Thoracic Surgery (Cardiothoracic Vascular Surgery)

## 2022-04-22 ENCOUNTER — Inpatient Hospital Stay (HOSPITAL_COMMUNITY): Payer: 59 | Admitting: Certified Registered"

## 2022-04-22 DIAGNOSIS — Z9049 Acquired absence of other specified parts of digestive tract: Secondary | ICD-10-CM

## 2022-04-22 DIAGNOSIS — I3139 Other pericardial effusion (noninflammatory): Secondary | ICD-10-CM | POA: Diagnosis present

## 2022-04-22 DIAGNOSIS — Z86711 Personal history of pulmonary embolism: Secondary | ICD-10-CM

## 2022-04-22 DIAGNOSIS — Z952 Presence of prosthetic heart valve: Secondary | ICD-10-CM | POA: Diagnosis present

## 2022-04-22 DIAGNOSIS — Z885 Allergy status to narcotic agent status: Secondary | ICD-10-CM

## 2022-04-22 DIAGNOSIS — Z8661 Personal history of infections of the central nervous system: Secondary | ICD-10-CM

## 2022-04-22 DIAGNOSIS — I2510 Atherosclerotic heart disease of native coronary artery without angina pectoris: Secondary | ICD-10-CM

## 2022-04-22 DIAGNOSIS — R410 Disorientation, unspecified: Secondary | ICD-10-CM | POA: Diagnosis not present

## 2022-04-22 DIAGNOSIS — Z883 Allergy status to other anti-infective agents status: Secondary | ICD-10-CM

## 2022-04-22 DIAGNOSIS — I351 Nonrheumatic aortic (valve) insufficiency: Secondary | ICD-10-CM | POA: Diagnosis present

## 2022-04-22 DIAGNOSIS — I480 Paroxysmal atrial fibrillation: Secondary | ICD-10-CM | POA: Diagnosis not present

## 2022-04-22 DIAGNOSIS — E039 Hypothyroidism, unspecified: Secondary | ICD-10-CM | POA: Diagnosis present

## 2022-04-22 DIAGNOSIS — E785 Hyperlipidemia, unspecified: Secondary | ICD-10-CM | POA: Diagnosis present

## 2022-04-22 DIAGNOSIS — I130 Hypertensive heart and chronic kidney disease with heart failure and stage 1 through stage 4 chronic kidney disease, or unspecified chronic kidney disease: Secondary | ICD-10-CM | POA: Diagnosis present

## 2022-04-22 DIAGNOSIS — Z823 Family history of stroke: Secondary | ICD-10-CM

## 2022-04-22 DIAGNOSIS — G4733 Obstructive sleep apnea (adult) (pediatric): Secondary | ICD-10-CM | POA: Diagnosis present

## 2022-04-22 DIAGNOSIS — R739 Hyperglycemia, unspecified: Secondary | ICD-10-CM | POA: Diagnosis not present

## 2022-04-22 DIAGNOSIS — R443 Hallucinations, unspecified: Secondary | ICD-10-CM | POA: Diagnosis not present

## 2022-04-22 DIAGNOSIS — I5032 Chronic diastolic (congestive) heart failure: Secondary | ICD-10-CM | POA: Diagnosis present

## 2022-04-22 DIAGNOSIS — Z8349 Family history of other endocrine, nutritional and metabolic diseases: Secondary | ICD-10-CM

## 2022-04-22 DIAGNOSIS — T8111XA Postprocedural  cardiogenic shock, initial encounter: Secondary | ICD-10-CM | POA: Diagnosis not present

## 2022-04-22 DIAGNOSIS — D62 Acute posthemorrhagic anemia: Secondary | ICD-10-CM

## 2022-04-22 DIAGNOSIS — J45909 Unspecified asthma, uncomplicated: Secondary | ICD-10-CM | POA: Diagnosis present

## 2022-04-22 DIAGNOSIS — Y848 Other medical procedures as the cause of abnormal reaction of the patient, or of later complication, without mention of misadventure at the time of the procedure: Secondary | ICD-10-CM | POA: Diagnosis not present

## 2022-04-22 DIAGNOSIS — E871 Hypo-osmolality and hyponatremia: Secondary | ICD-10-CM | POA: Diagnosis not present

## 2022-04-22 DIAGNOSIS — N189 Chronic kidney disease, unspecified: Secondary | ICD-10-CM | POA: Diagnosis not present

## 2022-04-22 DIAGNOSIS — Z9289 Personal history of other medical treatment: Secondary | ICD-10-CM

## 2022-04-22 DIAGNOSIS — G4700 Insomnia, unspecified: Secondary | ICD-10-CM | POA: Diagnosis present

## 2022-04-22 DIAGNOSIS — N179 Acute kidney failure, unspecified: Secondary | ICD-10-CM | POA: Diagnosis not present

## 2022-04-22 DIAGNOSIS — Z006 Encounter for examination for normal comparison and control in clinical research program: Secondary | ICD-10-CM

## 2022-04-22 DIAGNOSIS — Z7901 Long term (current) use of anticoagulants: Secondary | ICD-10-CM

## 2022-04-22 DIAGNOSIS — Z79890 Hormone replacement therapy (postmenopausal): Secondary | ICD-10-CM

## 2022-04-22 DIAGNOSIS — I4892 Unspecified atrial flutter: Secondary | ICD-10-CM | POA: Diagnosis present

## 2022-04-22 DIAGNOSIS — Z825 Family history of asthma and other chronic lower respiratory diseases: Secondary | ICD-10-CM

## 2022-04-22 DIAGNOSIS — G060 Intracranial abscess and granuloma: Secondary | ICD-10-CM | POA: Diagnosis present

## 2022-04-22 DIAGNOSIS — I509 Heart failure, unspecified: Secondary | ICD-10-CM | POA: Diagnosis not present

## 2022-04-22 DIAGNOSIS — R609 Edema, unspecified: Secondary | ICD-10-CM | POA: Diagnosis not present

## 2022-04-22 DIAGNOSIS — R000 Tachycardia, unspecified: Secondary | ICD-10-CM | POA: Diagnosis not present

## 2022-04-22 DIAGNOSIS — Z951 Presence of aortocoronary bypass graft: Secondary | ICD-10-CM

## 2022-04-22 DIAGNOSIS — Z9911 Dependence on respirator [ventilator] status: Secondary | ICD-10-CM

## 2022-04-22 DIAGNOSIS — Z86718 Personal history of other venous thrombosis and embolism: Secondary | ICD-10-CM

## 2022-04-22 DIAGNOSIS — Z8673 Personal history of transient ischemic attack (TIA), and cerebral infarction without residual deficits: Secondary | ICD-10-CM

## 2022-04-22 DIAGNOSIS — I4891 Unspecified atrial fibrillation: Secondary | ICD-10-CM | POA: Diagnosis not present

## 2022-04-22 DIAGNOSIS — E875 Hyperkalemia: Secondary | ICD-10-CM | POA: Diagnosis not present

## 2022-04-22 DIAGNOSIS — K219 Gastro-esophageal reflux disease without esophagitis: Secondary | ICD-10-CM | POA: Diagnosis present

## 2022-04-22 DIAGNOSIS — D696 Thrombocytopenia, unspecified: Secondary | ICD-10-CM | POA: Diagnosis not present

## 2022-04-22 DIAGNOSIS — Z888 Allergy status to other drugs, medicaments and biological substances status: Secondary | ICD-10-CM

## 2022-04-22 DIAGNOSIS — Z9989 Dependence on other enabling machines and devices: Secondary | ICD-10-CM | POA: Diagnosis not present

## 2022-04-22 DIAGNOSIS — G40909 Epilepsy, unspecified, not intractable, without status epilepticus: Secondary | ICD-10-CM | POA: Diagnosis present

## 2022-04-22 DIAGNOSIS — M1990 Unspecified osteoarthritis, unspecified site: Secondary | ICD-10-CM | POA: Diagnosis present

## 2022-04-22 DIAGNOSIS — I340 Nonrheumatic mitral (valve) insufficiency: Secondary | ICD-10-CM | POA: Diagnosis not present

## 2022-04-22 DIAGNOSIS — F419 Anxiety disorder, unspecified: Secondary | ICD-10-CM | POA: Diagnosis present

## 2022-04-22 DIAGNOSIS — Z79899 Other long term (current) drug therapy: Secondary | ICD-10-CM

## 2022-04-22 DIAGNOSIS — I350 Nonrheumatic aortic (valve) stenosis: Secondary | ICD-10-CM | POA: Diagnosis not present

## 2022-04-22 DIAGNOSIS — R579 Shock, unspecified: Secondary | ICD-10-CM | POA: Diagnosis not present

## 2022-04-22 DIAGNOSIS — Z20822 Contact with and (suspected) exposure to covid-19: Secondary | ICD-10-CM | POA: Diagnosis present

## 2022-04-22 DIAGNOSIS — Z91041 Radiographic dye allergy status: Secondary | ICD-10-CM

## 2022-04-22 DIAGNOSIS — Z8774 Personal history of (corrected) congenital malformations of heart and circulatory system: Secondary | ICD-10-CM

## 2022-04-22 DIAGNOSIS — F909 Attention-deficit hyperactivity disorder, unspecified type: Secondary | ICD-10-CM | POA: Diagnosis present

## 2022-04-22 DIAGNOSIS — N1831 Chronic kidney disease, stage 3a: Secondary | ICD-10-CM | POA: Diagnosis present

## 2022-04-22 DIAGNOSIS — E876 Hypokalemia: Secondary | ICD-10-CM | POA: Diagnosis present

## 2022-04-22 HISTORY — PX: CORONARY ARTERY BYPASS GRAFT: SHX141

## 2022-04-22 HISTORY — PX: TEE WITHOUT CARDIOVERSION: SHX5443

## 2022-04-22 HISTORY — DX: Presence of prosthetic heart valve: Z95.2

## 2022-04-22 HISTORY — PX: AORTIC VALVE REPLACEMENT: SHX41

## 2022-04-22 LAB — GLUCOSE, CAPILLARY
Glucose-Capillary: 118 mg/dL — ABNORMAL HIGH (ref 70–99)
Glucose-Capillary: 121 mg/dL — ABNORMAL HIGH (ref 70–99)
Glucose-Capillary: 129 mg/dL — ABNORMAL HIGH (ref 70–99)
Glucose-Capillary: 124 mg/dL — ABNORMAL HIGH (ref 70–99)
Glucose-Capillary: 144 mg/dL — ABNORMAL HIGH (ref 70–99)
Glucose-Capillary: 115 mg/dL — ABNORMAL HIGH (ref 70–99)
Glucose-Capillary: 113 mg/dL — ABNORMAL HIGH (ref 70–99)
Glucose-Capillary: 121 mg/dL — ABNORMAL HIGH (ref 70–99)
Glucose-Capillary: 137 mg/dL — ABNORMAL HIGH (ref 70–99)

## 2022-04-22 LAB — POCT I-STAT, CHEM 8
BUN: 18 mg/dL (ref 8–23)
BUN: 16 mg/dL (ref 8–23)
BUN: 18 mg/dL (ref 8–23)
BUN: 17 mg/dL (ref 8–23)
Calcium, Ion: 1.26 mmol/L (ref 1.15–1.40)
Calcium, Ion: 1.21 mmol/L (ref 1.15–1.40)
Calcium, Ion: 1.02 mmol/L — ABNORMAL LOW (ref 1.15–1.40)
Calcium, Ion: 0.98 mmol/L — ABNORMAL LOW (ref 1.15–1.40)
Chloride: 102 mmol/L (ref 98–111)
Chloride: 104 mmol/L (ref 98–111)
Chloride: 101 mmol/L (ref 98–111)
Chloride: 100 mmol/L (ref 98–111)
Creatinine, Ser: 1.1 mg/dL (ref 0.61–1.24)
Creatinine, Ser: 0.9 mg/dL (ref 0.61–1.24)
Creatinine, Ser: 1 mg/dL (ref 0.61–1.24)
Creatinine, Ser: 1 mg/dL (ref 0.61–1.24)
Glucose, Bld: 99 mg/dL (ref 70–99)
Glucose, Bld: 98 mg/dL (ref 70–99)
Glucose, Bld: 101 mg/dL — ABNORMAL HIGH (ref 70–99)
Glucose, Bld: 141 mg/dL — ABNORMAL HIGH (ref 70–99)
HCT: 34 % — ABNORMAL LOW (ref 39.0–52.0)
HCT: 32 % — ABNORMAL LOW (ref 39.0–52.0)
HCT: 27 % — ABNORMAL LOW (ref 39.0–52.0)
HCT: 24 % — ABNORMAL LOW (ref 39.0–52.0)
Hemoglobin: 11.6 g/dL — ABNORMAL LOW (ref 13.0–17.0)
Hemoglobin: 10.9 g/dL — ABNORMAL LOW (ref 13.0–17.0)
Hemoglobin: 9.2 g/dL — ABNORMAL LOW (ref 13.0–17.0)
Hemoglobin: 8.2 g/dL — ABNORMAL LOW (ref 13.0–17.0)
Potassium: 4 mmol/L (ref 3.5–5.1)
Potassium: 3.8 mmol/L (ref 3.5–5.1)
Potassium: 5 mmol/L (ref 3.5–5.1)
Potassium: 5.2 mmol/L — ABNORMAL HIGH (ref 3.5–5.1)
Sodium: 141 mmol/L (ref 135–145)
Sodium: 140 mmol/L (ref 135–145)
Sodium: 138 mmol/L (ref 135–145)
Sodium: 137 mmol/L (ref 135–145)
TCO2: 25 mmol/L (ref 22–32)
TCO2: 24 mmol/L (ref 22–32)
TCO2: 27 mmol/L (ref 22–32)
TCO2: 25 mmol/L (ref 22–32)

## 2022-04-22 LAB — BASIC METABOLIC PANEL
Anion gap: 6 (ref 5–15)
BUN: 15 mg/dL (ref 8–23)
CO2: 24 mmol/L (ref 22–32)
Calcium: 7.8 mg/dL — ABNORMAL LOW (ref 8.9–10.3)
Chloride: 108 mmol/L (ref 98–111)
Creatinine, Ser: 1.11 mg/dL (ref 0.61–1.24)
GFR, Estimated: >60 mL/min (ref >60)
Glucose, Bld: 117 mg/dL — ABNORMAL HIGH (ref 70–99)
Potassium: 4.4 mmol/L (ref 3.5–5.1)
Sodium: 138 mmol/L (ref 135–145)

## 2022-04-22 LAB — POCT I-STAT 7, (LYTES, BLD GAS, ICA,H+H)
Acid-Base Excess: 4 mmol/L — ABNORMAL HIGH (ref 0.0–2.0)
Acid-Base Excess: 4 mmol/L — ABNORMAL HIGH (ref 0.0–2.0)
Acid-base deficit: 2 mmol/L (ref 0.0–2.0)
Acid-base deficit: 3 mmol/L — ABNORMAL HIGH (ref 0.0–2.0)
Acid-base deficit: 4 mmol/L — ABNORMAL HIGH (ref 0.0–2.0)
Bicarbonate: 22.7 mmol/L (ref 20.0–28.0)
Bicarbonate: 22.7 mmol/L (ref 20.0–28.0)
Bicarbonate: 23.2 mmol/L (ref 20.0–28.0)
Bicarbonate: 26.5 mmol/L (ref 20.0–28.0)
Bicarbonate: 29 mmol/L — ABNORMAL HIGH (ref 20.0–28.0)
Calcium, Ion: 1.05 mmol/L — ABNORMAL LOW (ref 1.15–1.40)
Calcium, Ion: 1.05 mmol/L — ABNORMAL LOW (ref 1.15–1.40)
Calcium, Ion: 1.07 mmol/L — ABNORMAL LOW (ref 1.15–1.40)
Calcium, Ion: 1.16 mmol/L (ref 1.15–1.40)
Calcium, Ion: 1.2 mmol/L (ref 1.15–1.40)
HCT: 26 % — ABNORMAL LOW (ref 39.0–52.0)
HCT: 26 % — ABNORMAL LOW (ref 39.0–52.0)
HCT: 27 % — ABNORMAL LOW (ref 39.0–52.0)
HCT: 27 % — ABNORMAL LOW (ref 39.0–52.0)
HCT: 30 % — ABNORMAL LOW (ref 39.0–52.0)
Hemoglobin: 10.2 g/dL — ABNORMAL LOW (ref 13.0–17.0)
Hemoglobin: 8.8 g/dL — ABNORMAL LOW (ref 13.0–17.0)
Hemoglobin: 8.8 g/dL — ABNORMAL LOW (ref 13.0–17.0)
Hemoglobin: 9.2 g/dL — ABNORMAL LOW (ref 13.0–17.0)
Hemoglobin: 9.2 g/dL — ABNORMAL LOW (ref 13.0–17.0)
O2 Saturation: 100 %
O2 Saturation: 100 %
O2 Saturation: 99 %
O2 Saturation: 99 %
O2 Saturation: 99 %
Patient temperature: 35.3
Patient temperature: 37.2
Patient temperature: 37.2
Potassium: 3.9 mmol/L (ref 3.5–5.1)
Potassium: 4.2 mmol/L (ref 3.5–5.1)
Potassium: 4.2 mmol/L (ref 3.5–5.1)
Potassium: 4.2 mmol/L (ref 3.5–5.1)
Potassium: 5.2 mmol/L — ABNORMAL HIGH (ref 3.5–5.1)
Sodium: 139 mmol/L (ref 135–145)
Sodium: 139 mmol/L (ref 135–145)
Sodium: 140 mmol/L (ref 135–145)
Sodium: 140 mmol/L (ref 135–145)
Sodium: 140 mmol/L (ref 135–145)
TCO2: 24 mmol/L (ref 22–32)
TCO2: 24 mmol/L (ref 22–32)
TCO2: 24 mmol/L (ref 22–32)
TCO2: 27 mmol/L (ref 22–32)
TCO2: 30 mmol/L (ref 22–32)
pCO2 arterial: 32.7 mmHg (ref 32–48)
pCO2 arterial: 40.7 mmHg (ref 32–48)
pCO2 arterial: 42.3 mmHg (ref 32–48)
pCO2 arterial: 42.8 mmHg (ref 32–48)
pCO2 arterial: 43.6 mmHg (ref 32–48)
pH, Arterial: 7.329 — ABNORMAL LOW (ref 7.35–7.45)
pH, Arterial: 7.334 — ABNORMAL LOW (ref 7.35–7.45)
pH, Arterial: 7.365 (ref 7.35–7.45)
pH, Arterial: 7.431 (ref 7.35–7.45)
pH, Arterial: 7.517 — ABNORMAL HIGH (ref 7.35–7.45)
pO2, Arterial: 126 mmHg — ABNORMAL HIGH (ref 83–108)
pO2, Arterial: 128 mmHg — ABNORMAL HIGH (ref 83–108)
pO2, Arterial: 147 mmHg — ABNORMAL HIGH (ref 83–108)
pO2, Arterial: 312 mmHg — ABNORMAL HIGH (ref 83–108)
pO2, Arterial: 351 mmHg — ABNORMAL HIGH (ref 83–108)

## 2022-04-22 LAB — CBC
HCT: 31.1 % — ABNORMAL LOW (ref 39.0–52.0)
HCT: 30 % — ABNORMAL LOW (ref 39.0–52.0)
Hemoglobin: 10.6 g/dL — ABNORMAL LOW (ref 13.0–17.0)
Hemoglobin: 10.3 g/dL — ABNORMAL LOW (ref 13.0–17.0)
MCH: 31.1 pg (ref 26.0–34.0)
MCH: 31 pg (ref 26.0–34.0)
MCHC: 34.1 g/dL (ref 30.0–36.0)
MCHC: 34.3 g/dL (ref 30.0–36.0)
MCV: 91.2 fL (ref 80.0–100.0)
MCV: 90.4 fL (ref 80.0–100.0)
Platelets: 156 10*3/uL (ref 150–400)
Platelets: 169 10*3/uL (ref 150–400)
RBC: 3.41 MIL/uL — ABNORMAL LOW (ref 4.22–5.81)
RBC: 3.32 MIL/uL — ABNORMAL LOW (ref 4.22–5.81)
RDW: 12.7 % (ref 11.5–15.5)
RDW: 12.5 % (ref 11.5–15.5)
WBC: 20.4 10*3/uL — ABNORMAL HIGH (ref 4.0–10.5)
WBC: 18.2 10*3/uL — ABNORMAL HIGH (ref 4.0–10.5)
nRBC: 0 % (ref 0.0–0.2)
nRBC: 0 % (ref 0.0–0.2)

## 2022-04-22 LAB — POCT I-STAT EG7
Acid-Base Excess: 0 mmol/L (ref 0.0–2.0)
Acid-Base Excess: 1 mmol/L (ref 0.0–2.0)
Bicarbonate: 25.2 mmol/L (ref 20.0–28.0)
Bicarbonate: 26 mmol/L (ref 20.0–28.0)
Calcium, Ion: 1 mmol/L — ABNORMAL LOW (ref 1.15–1.40)
Calcium, Ion: 1.03 mmol/L — ABNORMAL LOW (ref 1.15–1.40)
HCT: 23 % — ABNORMAL LOW (ref 39.0–52.0)
HCT: 24 % — ABNORMAL LOW (ref 39.0–52.0)
Hemoglobin: 7.8 g/dL — ABNORMAL LOW (ref 13.0–17.0)
Hemoglobin: 8.2 g/dL — ABNORMAL LOW (ref 13.0–17.0)
O2 Saturation: 78 %
O2 Saturation: 84 %
Potassium: 5.2 mmol/L — ABNORMAL HIGH (ref 3.5–5.1)
Potassium: 5.8 mmol/L — ABNORMAL HIGH (ref 3.5–5.1)
Sodium: 137 mmol/L (ref 135–145)
Sodium: 139 mmol/L (ref 135–145)
TCO2: 26 mmol/L (ref 22–32)
TCO2: 27 mmol/L (ref 22–32)
pCO2, Ven: 40.2 mmHg — ABNORMAL LOW (ref 44–60)
pCO2, Ven: 41.3 mmHg — ABNORMAL LOW (ref 44–60)
pH, Ven: 7.393 (ref 7.25–7.43)
pH, Ven: 7.418 (ref 7.25–7.43)
pO2, Ven: 42 mmHg (ref 32–45)
pO2, Ven: 49 mmHg — ABNORMAL HIGH (ref 32–45)

## 2022-04-22 LAB — MAGNESIUM: Magnesium: 3.1 mg/dL — ABNORMAL HIGH (ref 1.7–2.4)

## 2022-04-22 LAB — PLATELET COUNT: Platelets: 162 10*3/uL (ref 150–400)

## 2022-04-22 LAB — ECHO INTRAOPERATIVE TEE
Height: 67 in
Weight: 3008 oz

## 2022-04-22 LAB — APTT: aPTT: 44 seconds — ABNORMAL HIGH (ref 24–36)

## 2022-04-22 LAB — PROTIME-INR
INR: 1.8 — ABNORMAL HIGH (ref 0.8–1.2)
Prothrombin Time: 20.3 seconds — ABNORMAL HIGH (ref 11.4–15.2)

## 2022-04-22 LAB — HEMOGLOBIN AND HEMATOCRIT, BLOOD
HCT: 27.5 % — ABNORMAL LOW (ref 39.0–52.0)
Hemoglobin: 9.9 g/dL — ABNORMAL LOW (ref 13.0–17.0)

## 2022-04-22 SURGERY — REPLACEMENT, AORTIC VALVE, OPEN
Anesthesia: General | Site: Chest

## 2022-04-22 MED ORDER — SODIUM BICARBONATE 8.4 % IV SOLN
INTRAVENOUS | Status: AC
  Filled 2022-04-22: qty 50

## 2022-04-22 MED ORDER — SODIUM CHLORIDE 0.9 % IV SOLN: INTRAVENOUS | Status: DC | PRN

## 2022-04-22 MED ORDER — ACETAMINOPHEN 160 MG/5ML PO SOLN: 650.0000 mg | Freq: Once | ORAL | Status: AC

## 2022-04-22 MED ORDER — PLASMA-LYTE A IV SOLN
INTRAVENOUS | Status: DC | PRN
  Administered 2022-04-22: 500 mL

## 2022-04-22 MED ORDER — BUDESONIDE 0.5 MG/2ML IN SUSP
0.5000 mg | Freq: Two times a day (BID) | RESPIRATORY_TRACT | Status: DC
  Administered 2022-04-22 – 2022-04-23 (×2): 0.5 mg via RESPIRATORY_TRACT
  Filled 2022-04-22 (×2): qty 2

## 2022-04-22 MED ORDER — ALBUMIN HUMAN 5 % IV SOLN: INTRAVENOUS | Status: DC | PRN

## 2022-04-22 MED ORDER — PROTAMINE SULFATE 10 MG/ML IV SOLN
INTRAVENOUS | Status: DC | PRN
  Administered 2022-04-22: 280 mg via INTRAVENOUS

## 2022-04-22 MED ORDER — EPHEDRINE 5 MG/ML INJ
INTRAVENOUS | Status: AC
  Filled 2022-04-22: qty 5

## 2022-04-22 MED ORDER — SODIUM CHLORIDE 0.45 % IV SOLN: INTRAVENOUS | Status: DC | PRN

## 2022-04-22 MED ORDER — BISACODYL 10 MG RE SUPP: 10.0000 mg | Freq: Every day | RECTAL | Status: DC

## 2022-04-22 MED ORDER — LACTATED RINGERS IV SOLN: INTRAVENOUS | Status: DC

## 2022-04-22 MED ORDER — ROCURONIUM BROMIDE 10 MG/ML (PF) SYRINGE
PREFILLED_SYRINGE | INTRAVENOUS | Status: AC
  Filled 2022-04-22: qty 40

## 2022-04-22 MED ORDER — ASPIRIN 81 MG PO CHEW: 324.0000 mg | CHEWABLE_TABLET | Freq: Every day | ORAL | Status: DC

## 2022-04-22 MED ORDER — PHENYLEPHRINE HCL-NACL 20-0.9 MG/250ML-% IV SOLN
0.0000 ug/min | INTRAVENOUS | Status: DC
  Administered 2022-04-23: 25 ug/min via INTRAVENOUS
  Filled 2022-04-22: qty 250

## 2022-04-22 MED ORDER — MOMETASONE FURO-FORMOTEROL FUM 200-5 MCG/ACT IN AERO
2.0000 | INHALATION_SPRAY | Freq: Two times a day (BID) | RESPIRATORY_TRACT | Status: DC
  Filled 2022-04-22: qty 8.8

## 2022-04-22 MED ORDER — BISACODYL 5 MG PO TBEC
10.0000 mg | DELAYED_RELEASE_TABLET | Freq: Every day | ORAL | Status: DC
  Administered 2022-04-23 – 2022-04-29 (×7): 10 mg via ORAL
  Filled 2022-04-22 (×9): qty 2

## 2022-04-22 MED ORDER — ACETAMINOPHEN 500 MG PO TABS
1000.0000 mg | ORAL_TABLET | Freq: Four times a day (QID) | ORAL | Status: AC
  Administered 2022-04-23 – 2022-04-27 (×19): 1000 mg via ORAL
  Filled 2022-04-22 (×19): qty 2

## 2022-04-22 MED ORDER — EPHEDRINE SULFATE-NACL 50-0.9 MG/10ML-% IV SOSY
PREFILLED_SYRINGE | INTRAVENOUS | Status: DC | PRN
  Administered 2022-04-22: 5 mg via INTRAVENOUS
  Administered 2022-04-22: 2.5 mg via INTRAVENOUS

## 2022-04-22 MED ORDER — ~~LOC~~ CARDIAC SURGERY, PATIENT & FAMILY EDUCATION
Freq: Once | Status: DC
  Filled 2022-04-22: qty 1

## 2022-04-22 MED ORDER — CALCIUM GLUCONATE-NACL 1-0.675 GM/50ML-% IV SOLN
1.0000 g | Freq: Once | INTRAVENOUS | Status: AC
  Administered 2022-04-22: 1000 mg via INTRAVENOUS
  Filled 2022-04-22: qty 50

## 2022-04-22 MED ORDER — DEXTROSE 50 % IV SOLN: 0.0000 mL | INTRAVENOUS | Status: DC | PRN

## 2022-04-22 MED ORDER — HEPARIN SODIUM (PORCINE) 1000 UNIT/ML IJ SOLN
INTRAMUSCULAR | Status: DC | PRN
  Administered 2022-04-22: 30000 [IU] via INTRAVENOUS

## 2022-04-22 MED ORDER — ONDANSETRON HCL 4 MG/2ML IJ SOLN
4.0000 mg | Freq: Four times a day (QID) | INTRAMUSCULAR | Status: DC | PRN
  Administered 2022-04-22: 4 mg via INTRAVENOUS
  Filled 2022-04-22: qty 2

## 2022-04-22 MED ORDER — LACTATED RINGERS IV SOLN: INTRAVENOUS | Status: DC | PRN

## 2022-04-22 MED ORDER — ACETAMINOPHEN 650 MG RE SUPP
650.0000 mg | Freq: Once | RECTAL | Status: AC
  Administered 2022-04-22: 650 mg via RECTAL

## 2022-04-22 MED ORDER — NITROGLYCERIN IN D5W 200-5 MCG/ML-% IV SOLN
INTRAVENOUS | Status: AC
  Filled 2022-04-22: qty 250

## 2022-04-22 MED ORDER — FENTANYL CITRATE (PF) 250 MCG/5ML IJ SOLN
INTRAMUSCULAR | Status: AC
  Filled 2022-04-22: qty 5

## 2022-04-22 MED ORDER — CHLORHEXIDINE GLUCONATE 0.12 % MT SOLN
15.0000 mL | Freq: Once | OROMUCOSAL | Status: AC
  Administered 2022-04-22: 15 mL via OROMUCOSAL
  Filled 2022-04-22: qty 15

## 2022-04-22 MED ORDER — ALBUTEROL SULFATE (2.5 MG/3ML) 0.083% IN NEBU: 2.5000 mg | INHALATION_SOLUTION | Freq: Four times a day (QID) | RESPIRATORY_TRACT | Status: DC | PRN

## 2022-04-22 MED ORDER — OXYCODONE HCL 5 MG PO TABS
5.0000 mg | ORAL_TABLET | ORAL | Status: DC | PRN
  Administered 2022-04-22 – 2022-04-26 (×13): 10 mg via ORAL
  Filled 2022-04-22 (×13): qty 2

## 2022-04-22 MED ORDER — FAMOTIDINE IN NACL 20-0.9 MG/50ML-% IV SOLN
20.0000 mg | Freq: Two times a day (BID) | INTRAVENOUS | Status: AC
  Administered 2022-04-22: 20 mg via INTRAVENOUS
  Filled 2022-04-22: qty 50

## 2022-04-22 MED ORDER — ASPIRIN 325 MG PO TBEC
325.0000 mg | DELAYED_RELEASE_TABLET | Freq: Every day | ORAL | Status: DC
  Administered 2022-04-23 – 2022-04-26 (×4): 325 mg via ORAL
  Filled 2022-04-22 (×4): qty 1

## 2022-04-22 MED ORDER — METOPROLOL TARTRATE 5 MG/5ML IV SOLN
2.5000 mg | INTRAVENOUS | Status: DC | PRN
  Administered 2022-04-25 – 2022-04-30 (×6): 5 mg via INTRAVENOUS
  Filled 2022-04-22 (×8): qty 5

## 2022-04-22 MED ORDER — CHLORHEXIDINE GLUCONATE CLOTH 2 % EX PADS
6.0000 | MEDICATED_PAD | Freq: Every day | CUTANEOUS | Status: DC
  Administered 2022-04-22 – 2022-04-30 (×9): 6 via TOPICAL

## 2022-04-22 MED ORDER — AZELASTINE HCL 0.1 % NA SOLN
1.0000 | Freq: Two times a day (BID) | NASAL | Status: DC | PRN
  Administered 2022-04-30: 1 via NASAL

## 2022-04-22 MED ORDER — DEXMEDETOMIDINE HCL IN NACL 400 MCG/100ML IV SOLN: 0.0000 ug/kg/h | INTRAVENOUS | Status: DC

## 2022-04-22 MED ORDER — LEVOTHYROXINE SODIUM 50 MCG PO TABS
50.0000 ug | ORAL_TABLET | ORAL | Status: DC
  Administered 2022-04-24 – 2022-05-04 (×5): 50 ug via ORAL
  Filled 2022-04-22 (×5): qty 1

## 2022-04-22 MED ORDER — ROSUVASTATIN CALCIUM 5 MG PO TABS
10.0000 mg | ORAL_TABLET | Freq: Every day | ORAL | Status: DC
  Administered 2022-04-23 – 2022-04-27 (×5): 10 mg via ORAL
  Filled 2022-04-22 (×5): qty 2

## 2022-04-22 MED ORDER — SODIUM CHLORIDE 0.9% FLUSH: 3.0000 mL | INTRAVENOUS | Status: DC | PRN

## 2022-04-22 MED ORDER — METOPROLOL TARTRATE 12.5 MG HALF TABLET
12.5000 mg | ORAL_TABLET | Freq: Once | ORAL | Status: AC
  Administered 2022-04-22: 12.5 mg via ORAL
  Filled 2022-04-22: qty 1

## 2022-04-22 MED ORDER — ARFORMOTEROL TARTRATE 15 MCG/2ML IN NEBU
15.0000 ug | INHALATION_SOLUTION | Freq: Two times a day (BID) | RESPIRATORY_TRACT | Status: DC
  Administered 2022-04-22 – 2022-04-23 (×2): 15 ug via RESPIRATORY_TRACT
  Filled 2022-04-22 (×2): qty 2

## 2022-04-22 MED ORDER — METOPROLOL TARTRATE 12.5 MG HALF TABLET
12.5000 mg | ORAL_TABLET | Freq: Two times a day (BID) | ORAL | Status: DC
  Administered 2022-04-23 – 2022-04-25 (×3): 12.5 mg via ORAL
  Filled 2022-04-22 (×4): qty 1

## 2022-04-22 MED ORDER — ORAL CARE MOUTH RINSE
15.0000 mL | OROMUCOSAL | Status: DC
  Administered 2022-04-22 (×4): 15 mL via OROMUCOSAL

## 2022-04-22 MED ORDER — ORAL CARE MOUTH RINSE: 15.0000 mL | OROMUCOSAL | Status: DC | PRN

## 2022-04-22 MED ORDER — MIDAZOLAM HCL 2 MG/2ML IJ SOLN: 2.0000 mg | INTRAMUSCULAR | Status: DC | PRN

## 2022-04-22 MED ORDER — POTASSIUM CHLORIDE 10 MEQ/50ML IV SOLN: 10.0000 meq | INTRAVENOUS | Status: AC

## 2022-04-22 MED ORDER — ETOMIDATE 2 MG/ML IV SOLN
INTRAVENOUS | Status: AC
  Filled 2022-04-22: qty 10

## 2022-04-22 MED ORDER — VANCOMYCIN HCL IN DEXTROSE 1-5 GM/200ML-% IV SOLN
1000.0000 mg | Freq: Once | INTRAVENOUS | Status: AC
  Administered 2022-04-22: 1000 mg via INTRAVENOUS
  Filled 2022-04-22: qty 200

## 2022-04-22 MED ORDER — CEFAZOLIN SODIUM-DEXTROSE 2-4 GM/100ML-% IV SOLN
2.0000 g | Freq: Three times a day (TID) | INTRAVENOUS | Status: AC
  Administered 2022-04-22 – 2022-04-24 (×6): 2 g via INTRAVENOUS
  Filled 2022-04-22 (×6): qty 100

## 2022-04-22 MED ORDER — MIDAZOLAM HCL (PF) 5 MG/ML IJ SOLN
INTRAMUSCULAR | Status: DC | PRN
  Administered 2022-04-22: 2 mg via INTRAVENOUS
  Administered 2022-04-22: 3 mg via INTRAVENOUS
  Administered 2022-04-22: 1 mg via INTRAVENOUS
  Administered 2022-04-22 (×2): 2 mg via INTRAVENOUS

## 2022-04-22 MED ORDER — MUPIROCIN 2 % EX OINT
1.0000 | TOPICAL_OINTMENT | Freq: Once | CUTANEOUS | Status: AC
  Administered 2022-04-22: 1 via TOPICAL

## 2022-04-22 MED ORDER — ROCURONIUM BROMIDE 10 MG/ML (PF) SYRINGE
PREFILLED_SYRINGE | INTRAVENOUS | Status: DC | PRN
  Administered 2022-04-22: 40 mg via INTRAVENOUS
  Administered 2022-04-22: 100 mg via INTRAVENOUS
  Administered 2022-04-22 (×4): 30 mg via INTRAVENOUS

## 2022-04-22 MED ORDER — LEVETIRACETAM 500 MG PO TABS
500.0000 mg | ORAL_TABLET | Freq: Two times a day (BID) | ORAL | Status: DC
  Filled 2022-04-22: qty 1

## 2022-04-22 MED ORDER — SODIUM CHLORIDE 0.9 % IV SOLN: 250.0000 mL | INTRAVENOUS | Status: DC

## 2022-04-22 MED ORDER — PANTOPRAZOLE SODIUM 40 MG PO TBEC: 40.0000 mg | DELAYED_RELEASE_TABLET | ORAL | Status: DC

## 2022-04-22 MED ORDER — METOPROLOL TARTRATE 25 MG/10 ML ORAL SUSPENSION: 12.5000 mg | Freq: Two times a day (BID) | ORAL | Status: DC

## 2022-04-22 MED ORDER — MORPHINE SULFATE (PF) 2 MG/ML IV SOLN
1.0000 mg | INTRAVENOUS | Status: DC | PRN
  Administered 2022-04-22: 2 mg via INTRAVENOUS
  Administered 2022-04-23: 4 mg via INTRAVENOUS
  Administered 2022-04-23: 2 mg via INTRAVENOUS
  Filled 2022-04-22 (×2): qty 1
  Filled 2022-04-22: qty 2

## 2022-04-22 MED ORDER — PHENYLEPHRINE 80 MCG/ML (10ML) SYRINGE FOR IV PUSH (FOR BLOOD PRESSURE SUPPORT)
PREFILLED_SYRINGE | INTRAVENOUS | Status: AC
  Filled 2022-04-22: qty 20

## 2022-04-22 MED ORDER — ACETAMINOPHEN 160 MG/5ML PO SOLN: 1000.0000 mg | Freq: Four times a day (QID) | ORAL | Status: AC

## 2022-04-22 MED ORDER — ARTIFICIAL TEARS OPHTHALMIC OINT
TOPICAL_OINTMENT | OPHTHALMIC | Status: DC | PRN
  Administered 2022-04-22: 1 via OPHTHALMIC

## 2022-04-22 MED ORDER — PHENYLEPHRINE 80 MCG/ML (10ML) SYRINGE FOR IV PUSH (FOR BLOOD PRESSURE SUPPORT)
PREFILLED_SYRINGE | INTRAVENOUS | Status: DC | PRN
  Administered 2022-04-22: 160 ug via INTRAVENOUS
  Administered 2022-04-22: 80 ug via INTRAVENOUS

## 2022-04-22 MED ORDER — SODIUM CHLORIDE (PF) 0.9 % IJ SOLN
INTRAMUSCULAR | Status: AC
  Filled 2022-04-22: qty 10

## 2022-04-22 MED ORDER — THROMBIN (RECOMBINANT) 20000 UNITS EX SOLR
CUTANEOUS | Status: AC
  Filled 2022-04-22: qty 20000

## 2022-04-22 MED ORDER — CHLORHEXIDINE GLUCONATE 0.12 % MT SOLN
15.0000 mL | OROMUCOSAL | Status: AC
  Administered 2022-04-22: 15 mL via OROMUCOSAL
  Filled 2022-04-22: qty 15

## 2022-04-22 MED ORDER — PROPOFOL 10 MG/ML IV BOLUS
INTRAVENOUS | Status: DC | PRN
  Administered 2022-04-22: 90 mg via INTRAVENOUS

## 2022-04-22 MED ORDER — PANTOPRAZOLE SODIUM 40 MG PO TBEC
40.0000 mg | DELAYED_RELEASE_TABLET | Freq: Every day | ORAL | Status: DC
  Administered 2022-04-24 – 2022-05-04 (×10): 40 mg via ORAL
  Filled 2022-04-22 (×10): qty 1

## 2022-04-22 MED ORDER — NITROGLYCERIN IN D5W 200-5 MCG/ML-% IV SOLN: 0.0000 ug/min | INTRAVENOUS | Status: DC

## 2022-04-22 MED ORDER — LACTATED RINGERS IV SOLN: 500.0000 mL | Freq: Once | INTRAVENOUS | Status: DC | PRN

## 2022-04-22 MED ORDER — MAGNESIUM SULFATE 4 GM/100ML IV SOLN
4.0000 g | Freq: Once | INTRAVENOUS | Status: AC
  Administered 2022-04-22: 4 g via INTRAVENOUS
  Filled 2022-04-22: qty 100

## 2022-04-22 MED ORDER — FENTANYL CITRATE (PF) 250 MCG/5ML IJ SOLN
INTRAMUSCULAR | Status: DC | PRN
  Administered 2022-04-22: 50 ug via INTRAVENOUS
  Administered 2022-04-22 (×2): 100 ug via INTRAVENOUS
  Administered 2022-04-22: 150 ug via INTRAVENOUS
  Administered 2022-04-22 (×2): 50 ug via INTRAVENOUS
  Administered 2022-04-22 (×3): 100 ug via INTRAVENOUS
  Administered 2022-04-22: 50 ug via INTRAVENOUS
  Administered 2022-04-22: 100 ug via INTRAVENOUS

## 2022-04-22 MED ORDER — TRAMADOL HCL 50 MG PO TABS
50.0000 mg | ORAL_TABLET | ORAL | Status: DC | PRN
  Administered 2022-04-23: 100 mg via ORAL
  Administered 2022-04-23: 50 mg via ORAL
  Filled 2022-04-22: qty 2
  Filled 2022-04-22: qty 1

## 2022-04-22 MED ORDER — VANCOMYCIN HCL 1000 MG IV SOLR
INTRAVENOUS | Status: DC | PRN
  Administered 2022-04-22: 1000 mL

## 2022-04-22 MED ORDER — ESCITALOPRAM OXALATE 10 MG PO TABS
10.0000 mg | ORAL_TABLET | Freq: Every day | ORAL | Status: DC
  Administered 2022-04-23 – 2022-04-30 (×8): 10 mg via ORAL
  Filled 2022-04-22 (×8): qty 1

## 2022-04-22 MED ORDER — 0.9 % SODIUM CHLORIDE (POUR BTL) OPTIME
TOPICAL | Status: DC | PRN
  Administered 2022-04-22: 2000 mL

## 2022-04-22 MED ORDER — DOCUSATE SODIUM 100 MG PO CAPS
200.0000 mg | ORAL_CAPSULE | Freq: Every day | ORAL | Status: DC
  Administered 2022-04-23 – 2022-04-29 (×7): 200 mg via ORAL
  Filled 2022-04-22 (×10): qty 2

## 2022-04-22 MED ORDER — MUPIROCIN 2 % EX OINT
TOPICAL_OINTMENT | CUTANEOUS | Status: AC
  Filled 2022-04-22: qty 22

## 2022-04-22 MED ORDER — PROPOFOL 10 MG/ML IV BOLUS
INTRAVENOUS | Status: AC
  Filled 2022-04-22: qty 20

## 2022-04-22 MED ORDER — ALBUTEROL SULFATE HFA 108 (90 BASE) MCG/ACT IN AERS: 2.0000 | INHALATION_SPRAY | Freq: Four times a day (QID) | RESPIRATORY_TRACT | Status: DC | PRN

## 2022-04-22 MED ORDER — PROTAMINE SULFATE 10 MG/ML IV SOLN
INTRAVENOUS | Status: AC
  Filled 2022-04-22: qty 5

## 2022-04-22 MED ORDER — CHLORHEXIDINE GLUCONATE 0.12 % MT SOLN: 15.0000 mL | Freq: Once | OROMUCOSAL | Status: DC

## 2022-04-22 MED ORDER — LEVOTHYROXINE SODIUM 100 MCG PO TABS
100.0000 ug | ORAL_TABLET | ORAL | Status: DC
  Administered 2022-04-23 – 2022-05-03 (×7): 100 ug via ORAL
  Filled 2022-04-22 (×8): qty 1

## 2022-04-22 MED ORDER — INSULIN REGULAR(HUMAN) IN NACL 100-0.9 UT/100ML-% IV SOLN: INTRAVENOUS | Status: DC

## 2022-04-22 MED ORDER — SODIUM CHLORIDE 0.9 % IV SOLN: INTRAVENOUS | Status: DC

## 2022-04-22 MED ORDER — LEVETIRACETAM IN NACL 500 MG/100ML IV SOLN
500.0000 mg | Freq: Once | INTRAVENOUS | Status: AC
  Administered 2022-04-22: 500 mg via INTRAVENOUS
  Filled 2022-04-22: qty 100

## 2022-04-22 MED ORDER — SODIUM CHLORIDE 0.9% FLUSH
3.0000 mL | Freq: Two times a day (BID) | INTRAVENOUS | Status: DC
  Administered 2022-04-23 – 2022-04-28 (×8): 3 mL via INTRAVENOUS

## 2022-04-22 MED ORDER — ALBUMIN HUMAN 5 % IV SOLN
250.0000 mL | INTRAVENOUS | Status: AC | PRN
  Administered 2022-04-22 (×3): 12.5 g via INTRAVENOUS
  Filled 2022-04-22 (×2): qty 250

## 2022-04-22 MED ORDER — ROCURONIUM BROMIDE 10 MG/ML (PF) SYRINGE
PREFILLED_SYRINGE | INTRAVENOUS | Status: AC
  Filled 2022-04-22: qty 20

## 2022-04-22 MED ORDER — EPHEDRINE 5 MG/ML INJ
INTRAVENOUS | Status: AC
  Filled 2022-04-22: qty 10

## 2022-04-22 MED ORDER — MIDAZOLAM HCL (PF) 10 MG/2ML IJ SOLN
INTRAMUSCULAR | Status: AC
  Filled 2022-04-22: qty 2

## 2022-04-22 MED ORDER — LEVETIRACETAM 500 MG PO TABS
500.0000 mg | ORAL_TABLET | Freq: Two times a day (BID) | ORAL | Status: DC
  Administered 2022-04-23 – 2022-05-04 (×23): 500 mg via ORAL
  Filled 2022-04-22 (×24): qty 1

## 2022-04-22 MED ORDER — SODIUM CHLORIDE (PF) 0.9 % IJ SOLN
INTRAMUSCULAR | Status: AC
  Filled 2022-04-22: qty 20

## 2022-04-22 MED ORDER — CHLORHEXIDINE GLUCONATE 4 % EX LIQD: 30.0000 mL | CUTANEOUS | Status: DC

## 2022-04-22 MED ORDER — ORAL CARE MOUTH RINSE: 15.0000 mL | Freq: Once | OROMUCOSAL | Status: AC

## 2022-04-22 MED FILL — Lidocaine HCl Local Preservative Free (PF) Inj 2%: INTRAMUSCULAR | Qty: 14 | Status: AC

## 2022-04-22 MED FILL — Potassium Chloride Inj 2 mEq/ML: INTRAVENOUS | Qty: 20 | Status: AC

## 2022-04-22 MED FILL — Heparin Sodium (Porcine) Inj 1000 Unit/ML: Qty: 1000 | Status: AC

## 2022-04-22 SURGICAL SUPPLY — 103 items
ADAPTER CARDIO PERF ANTE/RETRO (ADAPTER) ×2 IMPLANT
APPLIER CLIP 13 LRG OPEN (CLIP) ×2
APR CLP LRG 13 20 CLIP (CLIP) ×1
BAG DECANTER FOR FLEXI CONT (MISCELLANEOUS) ×2 IMPLANT
BLADE 11 SAFETY STRL DISP (BLADE) IMPLANT
BLADE CLIPPER SURG (BLADE) ×2 IMPLANT
BLADE MICRO SHARP 3 15 DEG (BLADE) ×2 IMPLANT
BLADE STERNUM SYSTEM 6 (BLADE) ×2 IMPLANT
BNDG ELASTIC 4X5.8 VLCR STR LF (GAUZE/BANDAGES/DRESSINGS) ×2 IMPLANT
BNDG ELASTIC 6X5.8 VLCR STR LF (GAUZE/BANDAGES/DRESSINGS) ×2 IMPLANT
BNDG GAUZE DERMACEA FLUFF 4 (GAUZE/BANDAGES/DRESSINGS) ×2 IMPLANT
BOOT SUTURE AID YELLOW STND (SUTURE) ×2 IMPLANT
CANISTER SUCT 3000ML PPV (MISCELLANEOUS) ×2 IMPLANT
CANNULA AORTIC ROOT 9FR (CANNULA) IMPLANT
CANNULA MC2 2 STG 29/37 NON-V (CANNULA) IMPLANT
CANNULA MC2 TWO STAGE (CANNULA) ×2
CANNULA NON VENT 22FR 12 (CANNULA) ×2 IMPLANT
CANNULA VESSEL 3MM 2 BLNT TIP (CANNULA) IMPLANT
CATH HEART VENT LEFT (CATHETERS) ×2 IMPLANT
CATH RETROPLEGIA CORONARY 14FR (CATHETERS) ×2 IMPLANT
CATH ROBINSON RED A/P 18FR (CATHETERS) ×6 IMPLANT
CATH THOR STR 32F SOFT 20 RADI (CATHETERS) ×4 IMPLANT
CATH THORACIC 28FR RT ANG (CATHETERS) ×2 IMPLANT
CLIP APPLIE 13 LRG OPEN (CLIP) IMPLANT
CLIP TI LARGE 6 (CLIP) ×2 IMPLANT
CLIP VESOCCLUDE MED 24/CT (CLIP) IMPLANT
CLIP VESOCCLUDE SM WIDE 24/CT (CLIP) IMPLANT
CNTNR URN SCR LID CUP LEK RST (MISCELLANEOUS) IMPLANT
CONT SPEC 4OZ STRL OR WHT (MISCELLANEOUS) ×6
CONTAINER PROTECT SURGISLUSH (MISCELLANEOUS) ×6 IMPLANT
DERMABOND ADVANCED .7 DNX12 (GAUZE/BANDAGES/DRESSINGS) IMPLANT
DEVICE SUT CK QUICK LOAD MINI (Prosthesis & Implant Heart) IMPLANT
DRAPE CARDIOVASCULAR INCISE (DRAPES) ×2
DRAPE INCISE IOBAN 66X45 STRL (DRAPES) ×2 IMPLANT
DRAPE SRG 135X102X78XABS (DRAPES) ×2 IMPLANT
DRAPE WARM FLUID 44X44 (DRAPES) ×2 IMPLANT
DRESSING AQUACEL AG ADV 3.5X12 (MISCELLANEOUS) ×2 IMPLANT
DRSG AQUACEL AG ADV 3.5X10 (GAUZE/BANDAGES/DRESSINGS) IMPLANT
DRSG AQUACEL AG ADV 3.5X12 (MISCELLANEOUS) ×2
DRSG AQUACEL AG ADV 3.5X14 (GAUZE/BANDAGES/DRESSINGS) ×2 IMPLANT
ELECT BLADE 4.0 EZ CLEAN MEGAD (MISCELLANEOUS) ×2
ELECT CAUTERY BLADE 6.4 (BLADE) ×2 IMPLANT
ELECT REM PT RETURN 9FT ADLT (ELECTROSURGICAL) ×4
ELECTRODE BLDE 4.0 EZ CLN MEGD (MISCELLANEOUS) ×2 IMPLANT
ELECTRODE REM PT RTRN 9FT ADLT (ELECTROSURGICAL) ×4 IMPLANT
FELT TEFLON 1X6 (MISCELLANEOUS) ×4 IMPLANT
GAUZE SPONGE 4X4 12PLY STRL (GAUZE/BANDAGES/DRESSINGS) ×4 IMPLANT
GLOVE BIOGEL PI IND STRL 7.5 (GLOVE) IMPLANT
GLOVE SS BIOGEL STRL SZ 6 (GLOVE) IMPLANT
GOWN STRL REUS W/ TWL LRG LVL3 (GOWN DISPOSABLE) ×12 IMPLANT
GOWN STRL REUS W/TWL LRG LVL3 (GOWN DISPOSABLE) ×4
HEMOSTAT POWDER SURGIFOAM 1G (HEMOSTASIS) ×6 IMPLANT
HEMOSTAT SURGICEL 2X14 (HEMOSTASIS) ×2 IMPLANT
INSERT FOGARTY 61MM (MISCELLANEOUS) IMPLANT
IV ADAPTER SYR DOUBLE MALE LL (MISCELLANEOUS) IMPLANT
KIT BASIN OR (CUSTOM PROCEDURE TRAY) ×2 IMPLANT
KIT CATH CPB BARTLE (MISCELLANEOUS) ×2 IMPLANT
KIT SUCTION CATH 14FR (SUCTIONS) ×2 IMPLANT
KIT SUT CK MINI COMBO 4X17 (Prosthesis & Implant Heart) IMPLANT
KIT TURNOVER KIT B (KITS) ×2 IMPLANT
KIT VASOVIEW HEMOPRO 2 VH 4000 (KITS) ×2 IMPLANT
KNIFE MICRO-UNI 3.5 30 DEG (BLADE) IMPLANT
LINE VENT (MISCELLANEOUS) IMPLANT
NS IRRIG 1000ML POUR BTL (IV SOLUTION) ×10 IMPLANT
ORGANIZER SUTURE GABBAY-FRATER (MISCELLANEOUS) IMPLANT
PACK E OPEN HEART (SUTURE) ×2 IMPLANT
PACK OPEN HEART (CUSTOM PROCEDURE TRAY) ×2 IMPLANT
PAD ARMBOARD 7.5X6 YLW CONV (MISCELLANEOUS) ×4 IMPLANT
PAD ELECT DEFIB RADIOL ZOLL (MISCELLANEOUS) ×2 IMPLANT
PENCIL BUTTON HOLSTER BLD 10FT (ELECTRODE) ×2 IMPLANT
POSITIONER HEAD DONUT 9IN (MISCELLANEOUS) ×2 IMPLANT
PUNCH AORTIC ROTATE 4.0MM (MISCELLANEOUS) ×2 IMPLANT
PUNCH AORTIC ROTATE 4.5MM 8IN (MISCELLANEOUS) ×2 IMPLANT
SET MPS 3-ND DEL (MISCELLANEOUS) IMPLANT
SUPPORT HEART JANKE-BARRON (MISCELLANEOUS) ×2 IMPLANT
SUT BONE WAX W31G (SUTURE) ×2 IMPLANT
SUT EB EXC GRN/WHT 2-0 V-5 (SUTURE) ×4 IMPLANT
SUT MNCRL AB 4-0 PS2 18 (SUTURE) ×4 IMPLANT
SUT PROLENE 4 0 RB 1 (SUTURE) ×18
SUT PROLENE 4 0 SH DA (SUTURE) ×2 IMPLANT
SUT PROLENE 4-0 RB1 .5 CRCL 36 (SUTURE) ×2 IMPLANT
SUT PROLENE 6 0 C 1 30 (SUTURE) IMPLANT
SUT PROLENE 7 0 BV1 MDA (SUTURE) ×2 IMPLANT
SUT SILK 2 0 SH (SUTURE) IMPLANT
SUT STEEL SZ 6 DBL 3X14 BALL (SUTURE) ×6 IMPLANT
SUT VIC AB 0 CTX 36 (SUTURE) ×4
SUT VIC AB 0 CTX36XBRD ANTBCTR (SUTURE) ×4 IMPLANT
SUT VIC AB 2-0 CT1 27 (SUTURE) ×6
SUT VIC AB 2-0 CT1 TAPERPNT 27 (SUTURE) ×4 IMPLANT
SYSTEM SAHARA CHEST DRAIN ATS (WOUND CARE) ×2 IMPLANT
TAPE CLOTH SURG 4X10 WHT LF (GAUZE/BANDAGES/DRESSINGS) IMPLANT
TAPE PAPER 2X10 WHT MICROPORE (GAUZE/BANDAGES/DRESSINGS) IMPLANT
TOWEL GREEN STERILE (TOWEL DISPOSABLE) ×2 IMPLANT
TOWEL GREEN STERILE FF (TOWEL DISPOSABLE) ×2 IMPLANT
TRAY FOLEY SLVR 16FR TEMP STAT (SET/KITS/TRAYS/PACK) ×2 IMPLANT
TUBE CONNECTING 20X1/4 (TUBING) IMPLANT
TUBE SUCTION CARDIAC 10FR (CANNULA) IMPLANT
TUBING LAP HI FLOW INSUFFLATIO (TUBING) ×2 IMPLANT
UNDERPAD 30X36 HEAVY ABSORB (UNDERPADS AND DIAPERS) ×2 IMPLANT
VALVE AORTIC SZ25 INSP/RESIL (Prosthesis & Implant Heart) IMPLANT
VENT LEFT HEART 12002 (CATHETERS) ×4
WATER STERILE IRR 1000ML POUR (IV SOLUTION) ×4 IMPLANT
YANKAUER SUCT BULB TIP NO VENT (SUCTIONS) IMPLANT

## 2022-04-22 NOTE — Procedures (Signed)
Extubation Procedure Note  Patient Details:   Name: [name] DOB: [birth year] MRN: [id number]   Airway Documentation:    Vent end date: 04/22/22 Vent end time: 2215   Evaluation  O2 sats: stable throughout Complications: No apparent complications Patient did tolerate procedure well. Bilateral Breath Sounds: Clear   Yes Pt extubated per weaning protocol. Vc 1.8 NIF -35. Vitals stable, pt alert to name and location. Audible cough leak heard. Abg within normal range. Pt on 4l [place] sat 100. No complication    [place]  Safira Proffit 04/22/2022, 10:27 PM

## 2022-04-22 NOTE — Anesthesia Procedure Notes (Signed)
Procedure Name: Intubation Date/Time: 04/22/2022 9:01 AM  Performed by: Anastasio Auerbach, CRNAPre-anesthesia Checklist: Patient identified, Emergency Drugs available, Suction available and Patient being monitored Patient Re-evaluated:Patient Re-evaluated prior to induction Oxygen Delivery Method: Circle system utilized Preoxygenation: Pre-oxygenation with 100% oxygen Induction Type: IV induction Ventilation: Mask ventilation without difficulty Laryngoscope Size: Mac and 3 Grade View: Grade I Tube type: Oral Tube size: 8.0 mm Number of attempts: 1 Airway Equipment and Method: Stylet and Oral airway Placement Confirmation: ETT inserted through vocal cords under direct vision, positive ETCO2 and breath sounds checked- equal and bilateral Secured at: 22 cm Tube secured with: Tape Dental Injury: Teeth and Oropharynx as per pre-operative assessment

## 2022-04-22 NOTE — Hospital Course (Addendum)
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 Pt has been doing fairly well after these issues and actually power lifts and aggressively using training bike. He reports over the past few months he has been more fatigued. He has no lower ext edema or lightheadedness. He has no palpitations or CP. He has been followed very closely by cardiology and has been having serial echos for known AI and ascending aortic aneurysm. He has recently been found to have worsening AI to know grade severe with dilation of the LV. He has mild Mr and mild to moderate TR. He as an EF of 45%. He was felt with increasing AI and dilation of LV with progressive symptoms to require AVR and was taken to the cath lab where he was found not to have PHTN however a proximal RCA lesion of over 70%. (Which I have personally reviewed and concur with findings). His aortic aneurysm has not been measured over 4.2 cm and has been confirmed by CT. Pt presents for discussion of surgical options moving forward with his cardiac issues. He does have Stage III Renal insufficiency in addition to above. The patient was evaluated by Dr. Lavonna Monarch who was in agreement he would require Aortic Valve Replacement and Coronary bypass grafting.  The risks and benefits of the procedure were explained to the patient and he was agreeable to proceed.  Hospital Course:  [name] presented to [facility] on 04/22/2022.  He was taken to the operating room and underwent Aortic Valve Replacement with a 25 mm [employer] Resilia Bioprosthetic Valve and Coronary bypass grafting x 1 utilizing SVG to RCA.  He tolerated the procedure without difficulty and was taken to the SICU in stable condition. He was extubated the evening of  surgery. He was weaned off drips as tolerated. His chest tubes and arterial line were removed without complication. He was restarted on vasopressin for hypotension. He developed Atrial Fibrillation with RVR.  He was not treated with Amiodarone due to allergy to Iodine.  He was started on Cardizem drip which had to be increased due to continued episodes.  His Lopressor dose was also increased for additional HR control.  He developed URI symptoms and has a family member who was positive for COVID.  Due to this repeat COVID test was obtained and was negative.  He was hypokalemic and his potassium supplementation was increased.  EP consultation was obtained who made adjustments to his Cardizem regimen.  His Lopressor dose was also increased.  They felt if his atrial Fibrillation persisted he could undergo cardioversion in the future. Once he was off all drips he was started on diuretics for volume overloaded state.  The patient again converted to Atrial Fibrillation.  He underwent Cardioversion on 04/30/2022.  The patient was then loaded and started on Tikosyn therapy. The dose was adjusted as needed.

## 2022-04-22 NOTE — Anesthesia Procedure Notes (Signed)
Central Venous Catheter Insertion Performed by: Audry Pili, MD, anesthesiologist Start/End10/05/2022 8:07 AM, 04/22/2022 8:18 AM Patient location: Pre-op. Preanesthetic checklist: patient identified, IV checked, risks and benefits discussed, surgical consent, monitors and equipment checked, pre-op evaluation, timeout performed and anesthesia consent Position: Trendelenburg Lidocaine 1% used for infiltration and patient sedated Hand hygiene performed , maximum sterile barriers used  and Seldinger technique used Catheter size: 8.5 Fr Central line was placed.MAC introducer Procedure performed using ultrasound guided technique. Ultrasound Notes:anatomy identified, needle tip was noted to be adjacent to the nerve/plexus identified, no ultrasound evidence of intravascular and/or intraneural injection and image(s) printed for medical record Attempts: 1 Following insertion, line sutured, dressing applied and Biopatch. Post procedure assessment: blood return through all ports, no air and free fluid flow  Patient tolerated the procedure well with no immediate complications.

## 2022-04-22 NOTE — Op Note (Addendum)
CARDIOVASCULAR SURGERY OPERATIVE NOTE  04/22/2022 [place] [id number]  Surgeon:  Everlena Cooper, MD  First Assistant: Ellwood Handler  [facility]   Preoperative Diagnosis:  Severe aortic regurgitation                                             Coronary artery disease   Postoperative Diagnosis:  Same   Procedure:  Median Sternotomy Extracorporeal circulation 3.   Aortic valve replacement using a 27m Inspiris Pericardial valve.(Serial #[id number]   4.   Coronary artery bypass x 1 with RSVG from aorta to RCA  Anesthesia:  General Endotracheal   Clinical History/Surgical Indication: Aortic Insufficiency  Pt with NYHA class II symptoms and with depressed LV function and dilation of ventrical, it is a class I indication to proceed with AVR. Pt and wife had an extensive counseling session of the prognosis and options of therapy. Pt would desire a tissue valve replacement and understands that the lifespan of the valve to be in the 10-15 yr range with possibility of transcatheter therapies in the future if this valve should deteriorate. The understand all the risks and goals of surgery, the recovery and the need to not lift over 10lbs for up to 6-8 weeks.    CAD  Pt with an RCA lesion of 70% that should be addressed with a SVG at the time of AVR.   Photos   Media Information   Document Information     Preparation:  The patient was seen in the preoperative holding area and the correct patient, correct operation were confirmed with the patient after reviewing the medical record and catheterization. The consent was signed by me. Preoperative antibiotics were given. A pulmonary arterial line and radial arterial line were placed by the anesthesia team. The patient was taken back to the operating room and positioned supine on the operating room table. After being placed under general endotracheal anesthesia by the anesthesia team a foley catheter was placed. The neck, chest, abdomen, and  both legs were prepped with betadine soap and solution and draped in the usual sterile manner. A surgical time-out was taken and the correct patient and operative procedure were confirmed with the nursing and anesthesia staff.   Pre-bypass TEE:   Complete TEE assessment was performed by Dr. TRenold DonMD.     Post-bypass TEE:   Normal functioning prosthetic aortic valve with no perivalvular leak or regurgitation through the valve.Mean gradient of 5 mmHg Left ventricular function preserved. mild mitral regurgitation.    Cardiopulmonary Bypass:  A median sternotomy was performed. The pericardium was opened in the midline. Right ventricular function appeared normal. The ascending aorta was of normal size and had no palpable plaque. There were no contraindications to aortic cannulation or cross-clamping. The patient was fully systemically heparinized and the ACT was maintained > 400 sec. The proximal aortic arch was cannulated with a 298F aortic cannula for arterial inflow. Venous cannulation was performed via the right atrial appendage using a two-staged venous cannula. An antegrade cardioplegia/vent cannula was inserted into the mid-ascending aorta. A left ventricular vent was placed via the right superior pulmonary vein. A retrograde cardioplegia cannnula was placed into the coronary sinus via the right atrium. Aortic occlusion was performed with a single cross-clamp. Systemic cooling to 32 degrees Centigrade and topical cooling of the heart with iced saline were used. KBC was  delivered first antegrade then retrograde for the amount calculated of around 1200cc. A reanimation dose of 400cc was given retrograde prior to cross clamp removal. Carbon dioxide was insufflated into the pericardium at 5L/min throughout the procedure to minimize intracardiac air.   Aortic Valve Replacement:   A transverse aortotomy was performed 1 cm above the take-off of the right coronary artery. The native valve was  tricuspid  with dilation of the annulus.The ostia of the coronary arteries were in normal position and were not obstructed. The native valve leaflets were excised and the annulus was decalcified with rongeurs. Care was taken to remove all particulate debris. The left ventricle was directly inspected for debris and then irrigated with ice saline solution. The annulus was sized and a size 59m inspiris pericardial  valve was chosen. The m serial number was [id number] While the valve was being prepared 2-0 Ethibond pledgeted horizontal mattress sutures were placed around the annulus with the pledgets in a sub-annular position. The sutures were placed through the sewing ring and the valve lowered into place. The sutures were tied sequentially with the core knot system. The valve seated nicely and the coronary ostia were not obstructed. The prosthetic valve leaflets moved normally and there was no sub-valvular obstruction. The aortotomy was closed using 4-0 Prolene pledgetted suture in 2 layers .  Coronary artery bypass The right coronary was brought into view with a 2-0 silk retraction suture and an arteriotomy performed and extended with pots scissors. Using a piece of RSVG that was harvested by the PA using the tunneled saphenous excision system from the left leg which was closed in layers with absorbable sutures, an end to side anastamosis was performed using 7-0 prolene continuous suture. It was flushed and deaired in the usual fashion.  The proximal RSVG was brought off the aorta through a 4.0 punch with a running 6-0 prolene. A proximal SVG marker was placed also.   Completion:  The patient was rewarmed to 37 degrees Centigrade. De-airing maneuvers were performed and the head placed in trendelenburg position. The crossclamp was removed with a time of 71 minutes. There was spontaneous return of sinus rhythm. The aortotomy was checked for hemostasis. Two temporary epicardial pacing wires were placed on the  right atrium and two on the right ventricle. The left ventricular vent and retrograde cardioplegia cannulas were removed. The patient was weaned from CPB without difficulty on no inotropes. CPB time was 98 minutes. . Heparin was fully reversed with protamine and the aortic and venous cannulas removed. Hemostasis was achieved. Mediastinal drainage tubes were placed. The sternum was closed with double #6 stainless steel wires. The fascia was closed with continuous # 1 vicryl suture. The subcutaneous tissue was closed with 2-0 vicryl continuous suture. The skin was closed with 3-0 vicryl subcuticular suture. All sponge, needle, and instrument counts were reported correct at the end of the case. Dry sterile dressings were placed over the incisions and around the chest tubes which were connected to pleurevac suction. The patient was then transported to the surgical intensive care unit in critical but stable condition.

## 2022-04-22 NOTE — Brief Op Note (Signed)
04/22/2022  9:42 AM  PATIENT:  [name]  66 y.o. male  PRE-OPERATIVE DIAGNOSIS:  AI CAD  POST-OPERATIVE DIAGNOSIS:  * No post-op diagnosis entered *  PROCEDURE:  Procedure(s):  AORTIC VALVE REPLACEMENT  -25 mm Edwards Resilia Bioprosthetic Vavle  CORONARY ARTERY BYPASS GRAFTING x 1 SVG to RCA  TRANSESOPHAGEAL ECHOCARDIOGRAM (TEE) (N/A)  ENDOSCOPIC HARVEST GREATER SAPHENOUS VEIN -Right Thigh Vein harvest time: 15 min Vein prep time: 10 min  SURGEON:  Surgeon(s) and Role:    Coralie Common, MD - Primary  PHYSICIAN ASSISTANT: Ellwood Handler PA-C  ASSISTANTS: Farrel Gordon RNFA   ANESTHESIA:   general  EBL:  400 mL   BLOOD ADMINISTERED:   CC CELLSAVER  DRAINS:  Chest Drains x 2    LOCAL MEDICATIONS USED:  NONE  SPECIMEN:  Source of Specimen:  Aortic Valve Leaflets  DISPOSITION OF SPECIMEN:  PATHOLOGY  COUNTS:  YES  TOURNIQUET:  * No tourniquets in log *  DICTATION: .Dragon Dictation  PLAN OF CARE: Admit to inpatient   PATIENT DISPOSITION:  ICU - intubated and hemodynamically stable.   Delay start of Pharmacological VTE agent (>24hrs) due to surgical blood loss or risk of bleeding: yes

## 2022-04-22 NOTE — Anesthesia Postprocedure Evaluation (Signed)
Anesthesia Post Note  Patient: [name]  Procedure(s) Performed: AORTIC VALVE REPLACEMENT (AVR) CORONARY ARTERY BYPASS GRAFTING (CABG) (Chest) TRANSESOPHAGEAL ECHOCARDIOGRAM (TEE)     Patient location during evaluation: ICU Anesthesia Type: General Level of consciousness: sedated and patient remains intubated per anesthesia plan Pain management: pain level controlled Vital Signs Assessment: post-procedure vital signs reviewed and stable Respiratory status: patient remains intubated per anesthesia plan Cardiovascular status: stable Postop Assessment: no apparent nausea or vomiting Anesthetic complications: no   No notable events documented.  Last Vitals:  Vitals:   04/22/22 0824 04/22/22 1313  BP:    Pulse: 66   Resp:    Temp:    SpO2: 97% 97%    Last Pain:  Vitals:   04/22/22 0716  TempSrc:   PainSc: 0-No pain                 Audry Pili

## 2022-04-22 NOTE — Consult Note (Signed)
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  Post operatively the patient remained intubated per plan and was admitted to ICU  Operative course unremarkable. No dysrhythmias. Variable neo requirement.  Cross clamp time 72 min Total pump time 99 min  EBL 1090 Cell saver 620   PCCM consulted in this setting    Pertinent  Medical History  CAD Aortic regurg Brain abscess Seizure disorder PFO s/p repair Dvt PE    Significant Hospital Events: Including procedures, antibiotic start and stop dates in addition to other pertinent events   10/11 CABG and AVR. Remains intubated after case. Anatomy not amenable to swan. PCCM consulted  Interim History / Subjective:  POD 0 CABG and AVR No swan   EBL 1090  + 620 cell saver  Xclamp 72 min Total pump 99 min   Remains intubated  On 40 neo  A paced   Objective   Blood pressure (!) 142/71, pulse 66, temperature 98 F (36.7 C), temperature source Oral, resp. rate 15, height '5\' 7"'$  (1.702 m), weight 85.3 kg, SpO2 97 %. PAP: (7-32)/(-1-12) 7/3  Vent Mode: SIMV;PSV;PRVC FiO2 (%):  [50 %] 50 % Set Rate:  [12 bmp] 12 bmp Vt Set:  [520 mL] 520 mL PEEP:  [5 cmH20] 5 cmH20 Pressure Support:  [10 cmH20] 10 cmH20 Plateau Pressure:  [13 cmH20] 13 cmH20   Intake/Output Summary (Last 24 hours) at 04/22/2022 1356 Last data filed at 04/22/2022 1317 Gross per 24 hour  Intake 2770 ml  Output 1100 ml  Net 1670 ml   Filed Weights   04/22/22 0708  Weight: 85.3 kg    Examination: General: Critically ill appearing adult M intubated sedated NAD  HENT: NCAT pink mm  ETT secured  Lungs: CTAb. Even and unlabored   Cardiovascular:  A paced. Cap refill brisk  Abdomen: soft round + bowel sounds  Extremities: no acute joint deformity. No cyanosis  Neuro: Sedated. PERRL 104m  GU: foley, yellow urine   Resolved Hospital Problem list     Assessment & Plan:   Severe aortic regurg s/p AVR CAD s/p CABG x 1 SVG to RCA Expected post operative ventilator management Expected post op ABLA  Shock -- multifactorial: cardiogenic, post-bypass vasoplegia, volume shifts -CXR personally reviewed, ETT in good position. L basilar opacity  P -pacing wires and chest tubes per CVTS  -complete txa -all transfusion decisions per cvts  -wean neo as able, receiving albumin now  -vanc, ancef surgical ppx -start low dose metop  -crestor  -insulin gtt per protocol  -incr RR to 14 based on ABG. Likely rapid vent wean this afternoon  -VAP, pulm hygiene   Hx OSA  Allergic rhinitis  ? Reactive airway disease -in review of last pulm note with Dr. 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 (2012) -some residual L sided weakness documented at baseline P -cont home Keppra '500mg'$  BID   CKD 3 P -cont foley -trend renal indices, UOP  Hypothyroidism  P -cont home synthroid 58m   GERD  P -PPI   Hypocalcemia Hyperkalemia P -replace Ca  -follow BMP   Best Practice (right click and "Reselect all SmartList Selections" daily)   Diet/type: NPO DVT prophylaxis: not indicated GI prophylaxis: PPI Lines: Central line and Arterial Line Foley:  Yes, and it is still needed Code Status:  full code Last date of multidisciplinary goals of care discussion [--]  Labs   CBC: Recent Labs  Lab 04/20/22 1121 04/22/22 0923 04/22/22 1041 04/22/22 1059 04/22/22 1125 04/22/22 1129 04/22/22 1210  WBC 8.2  --   --   --   --   --   --   HGB 14.7   < > 8.2* 9.2* 9.9* 9.2* 8.2*  HCT 43.6   <  > 24.0* 27.0* 27.5* 27.0* 24.0*  MCV 91.2  --   --   --   --   --   --   PLT 249  --   --   --  162  --   --    < > = values in this interval not displayed.    Basic Metabolic Panel: Recent Labs  Lab 04/20/22 1121 04/22/22 0923 04/22/22 1001 04/22/22 1028 04/22/22 1036 04/22/22 1041 04/22/22 1059 04/22/22 1129 04/22/22 1210  NA 139 141 140   < > 137 139 138 139 137  K 3.8 4.0 3.8   < > 5.8* 5.2* 5.0 5.2* 5.2*  CL 109 102 104  --   --   --  101  --  100  CO2 23  --   --   --   --   --   --   --   --   GLUCOSE 113* 99 98  --   --   --  101*  --  141*  BUN '17 18 16  '$ --   --   --  18  --  17  CREATININE 1.26* 1.10 0.90  --   --   --  1.00  --  1.00  CALCIUM 9.2  --   --   --   --   --   --   --   --    < > = values in this interval not displayed.   GFR: Estimated Creatinine Clearance: 76.9 mL/min (by C-G formula based on SCr of 1 mg/dL). Recent Labs  Lab 04/20/22 1121  WBC 8.2    Liver Function Tests: Recent Labs  Lab 04/20/22 1121  AST 22  ALT 17  ALKPHOS 54  BILITOT 0.8  PROT 6.9  ALBUMIN 4.1   No results for input(s): "LIPASE", "AMYLASE" in the last 168 hours. No results for input(s): "AMMONIA" in the last 168 hours.  ABG    Component Value Date/Time   PHART 7.517 (H) 04/22/2022 1129   PCO2ART 32.7 04/22/2022 1129   PO2ART 312 (H) 04/22/2022 1129   HCO3 26.5 04/22/2022 1129   TCO2 25 04/22/2022 1210   O2SAT 100 04/22/2022 1129     Coagulation Profile: Recent Labs  Lab 04/20/22 1121  INR 1.1    Cardiac Enzymes: No results for input(s): "CKTOTAL", "CKMB", "CKMBINDEX", "TROPONINI" in the last 168 hours.  HbA1C: Hgb A1c MFr Bld  Date/Time Value Ref Range Status  04/20/2022 11:21 AM 5.2 4.8 - 5.6 % Final    Comment:    (NOTE) Pre diabetes:          5.7%-6.4%  Diabetes:              >6.4%  Glycemic control for   <  7.0% adults with diabetes     CBG: Recent Labs  Lab 04/22/22 1337  GLUCAP 118*    Review of Systems:    Unable to  obtain Intubated sedated  Past Medical History:  He,  has a past medical history of ADHD, Anginal pain ([place]), Anxiety, Arthritis, Asthma, Brain abscess, Chronic renal insufficiency, stage III (moderate) ([place]), Coronary artery disease, DVT (deep venous thrombosis) ([place]), Esophageal reflux, History of osteomyelitis, History of transfusion of platelets, pulmonary embolus, Hyperlipidemia, Hypertension, Hypothyroidism, Neuropathy, Patent foramen ovale, Seizures ([place]), and Sleep apnea.   Surgical History:   Past Surgical History:  Procedure Laterality Date   ANTERIOR CRUCIATE LIGAMENT REPAIR     BRAIN SURGERY     abscess drainage x 2   CARPAL TUNNEL RELEASE     CRANIOTOMY     CRANIOTOMY  03/30/2012   Procedure: CRANIOTOMY TUMOR EXCISION;  Surgeon: Winfield Cunas, MD;  Location: [facility];  Service: Neurosurgery;  Laterality: Right;   craniotomy for intracranial mass.   CRANIOTOMY  03/31/2012   Procedure: CRANIOTOMY TUMOR EXCISION;  Surgeon: Winfield Cunas, MD;  Location: [facility];  Service: Neurosurgery;  Laterality: Right;  Craniotomy for excision of mass   Deep Vein     FRACTURE SURGERY     right arm   LAPAROSCOPIC APPENDECTOMY  11/20/2011   LAPAROSCOPIC APPENDECTOMY  11/20/2011   Procedure: APPENDECTOMY LAPAROSCOPIC;  Surgeon: Harl Bowie, MD;  Location: [facility];  Service: General;  Laterality: N/A;   PATENT FORAMEN OVALE CLOSURE     Pulmonary Embolus     RIGHT/LEFT HEART CATH AND CORONARY ANGIOGRAPHY N/A 03/10/2022   Procedure: RIGHT/LEFT HEART CATH AND CORONARY ANGIOGRAPHY;  Surgeon: Adrian Prows, MD;  Location: [facility];  Service: Cardiovascular;  Laterality: N/A;   ULNAR TUNNEL RELEASE       Social History:   reports that he has never smoked. He has never used smokeless tobacco. He reports that he does not drink alcohol and does not use drugs.   Family History:  His family history includes Allergies in an other family member; Asthma in an other family member;  Bladder Cancer in his mother; Diabetes in his brother; Stroke in his father; Thyroid disease in his sister.   Allergies Allergies  Allergen Reactions   Iodinated Contrast Media Anaphylaxis   Iohexol Anaphylaxis and Shortness Of Breath     Code: HIVES, Desc: hives and tachycardia last time pt received IV CM kdean 07/19/06, Onset Date: [id number]   On 9/9 pt had a ct head w/ and received 13hr premedication. Was fine after scan, but broke out in hives when he got home.  Took '100mg'$  benedryl at home PO, and was fine.    [place] Thyroid [Thyroid]     mouth swelling   Codeine     agitation   Dilantin [Phenytoin] Other (See Comments)    Destroyed Blood platelets    Heparin Hives    On going therapy    Hydrocodone     Agitation   Nuvigil [Armodafinil]     rash   Other     Follows Kosher Diet; Needs Kosher products   Shellfish Allergy Other (See Comments)    Mouth tingles   Flagyl [Metronidazole] Rash   Rocephin [Ceftriaxone] Rash     Home Medications  Prior to Admission medications   Medication Sig Start Date End Date Taking? Authorizing Provider  azelastine (ASTELIN) 0.1 % nasal spray Place 1 spray into both nostrils 2 (two) times  daily as needed for allergies. 11/27/13  Yes [provider]  escitalopram (LEXAPRO) 10 MG tablet Take 10 mg by mouth daily.   Yes [provider]  fluticasone-salmeterol (ADVAIR HFA) 230-21 MCG/ACT inhaler Inhale 2 puffs into the lungs 2 (two) times daily.   Yes [provider]  irbesartan (AVAPRO) 150 MG tablet Take 150 mg by mouth daily.   Yes [provider]  levETIRAcetam (KEPPRA) 500 MG tablet Take 500 mg by mouth 2 (two) times daily.    Yes [provider]  levothyroxine (SYNTHROID) 50 MCG tablet TAKE 1 TABLET BY MOUTH  DAILY Patient taking differently: Take 50-100 mcg by mouth See admin instructions. 50 mcg daily in the morning, alternating with 100 mcg every other day 12/05/20  Yes Shamleffer, Melanie Crazier, MD   pantoprazole (PROTONIX) 40 MG tablet Take 40 mg by mouth every other day.   Yes [provider]  rosuvastatin (CRESTOR) 10 MG tablet TAKE 1 TABLET BY MOUTH  DAILY 03/04/21  Yes Adrian Prows, MD  albuterol (PROVENTIL HFA;VENTOLIN HFA) 108 (90 BASE) MCG/ACT inhaler Inhale 2 puffs into the lungs every 6 (six) hours as needed for wheezing or shortness of breath.    [provider]     Critical care time: 40 min     CRITICAL CARE Performed by: Cristal Generous   Total critical care time: 40 minutes  Critical care time was exclusive of separately billable procedures and treating other patients.  Critical care was necessary to treat or prevent imminent or life-threatening deterioration.  Critical care was time spent personally by me on the following activities: development of treatment plan with patient and/or surrogate as well as nursing, discussions with consultants, evaluation of patient's response to treatment, examination of patient, obtaining history from patient or surrogate, ordering and performing treatments and interventions, ordering and review of laboratory studies, ordering and review of radiographic studies, pulse oximetry and re-evaluation of patient's condition.  Eliseo Gum MSN, AGACNP-BC [place] for pager  04/22/2022, 2:20 PM

## 2022-04-22 NOTE — Interval H&P Note (Signed)
History and Physical Interval Note:  04/22/2022 6:19 AM  [name]  has presented today for surgery, with the diagnosis of AI CAD.  The various methods of treatment have been discussed with the patient and family. After consideration of risks, benefits and other options for treatment, the patient has consented to  Procedure(s): AORTIC VALVE REPLACEMENT (AVR) (N/A) CORONARY ARTERY BYPASS GRAFTING (CABG) (N/A) TRANSESOPHAGEAL ECHOCARDIOGRAM (TEE) (N/A) as a surgical intervention.  The patient's history has been reviewed, patient examined, no change in status, stable for surgery.  I have reviewed the patient's chart and labs.  Questions were answered to the patient's satisfaction.     Coralie Common

## 2022-04-22 NOTE — Anesthesia Procedure Notes (Signed)
Arterial Line Insertion Start/End10/05/2022 8:00 AM, 04/22/2022 8:05 AM Performed by: Anastasio Auerbach, CRNA, CRNA  Patient location: Pre-op. Emergency situation Lidocaine 1% used for infiltration and patient sedated Left, radial was placed Catheter size: 20 G Hand hygiene performed , maximum sterile barriers used  and Seldinger technique used Allen's test indicative of satisfactory collateral circulation Attempts: 1 Procedure performed without using ultrasound guided technique. Following insertion, dressing applied and Biopatch. Post procedure assessment: normal  Patient tolerated the procedure well with no immediate complications.

## 2022-04-22 NOTE — Transfer of Care (Signed)
Immediate Anesthesia Transfer of Care Note  Patient: [name]  Procedure(s) Performed: AORTIC VALVE REPLACEMENT (AVR) CORONARY ARTERY BYPASS GRAFTING (CABG) (Chest) TRANSESOPHAGEAL ECHOCARDIOGRAM (TEE)  Patient Location: SICU  Anesthesia Type:General  Level of Consciousness: Patient remains intubated per anesthesia plan  Airway & Oxygen Therapy: Patient remains intubated per anesthesia plan and Patient placed on Ventilator (see vital sign flow sheet for setting)  Post-op Assessment: Report given to RN and Post -op Vital signs reviewed and stable  Post vital signs: Reviewed and stable  Last Vitals:  Vitals Value Taken Time  BP 121/81   Temp    Pulse 80 04/22/22 1318  Resp 12 04/22/22 1318  SpO2 99 % 04/22/22 1318  Vitals shown include unvalidated device data.  Last Pain:  Vitals:   04/22/22 0716  TempSrc:   PainSc: 0-No pain         Complications: No notable events documented.

## 2022-04-22 NOTE — Anesthesia Procedure Notes (Signed)
Central Venous Catheter Insertion Performed by: Audry Pili, MD, anesthesiologist Start/End10/05/2022 8:14 AM, 04/22/2022 8:18 AM Patient location: Pre-op. Preanesthetic checklist: patient identified, IV checked, risks and benefits discussed, surgical consent, monitors and equipment checked, pre-op evaluation, timeout performed and anesthesia consent Position: Trendelenburg Hand hygiene performed  and maximum sterile barriers used  PA cath was placed.Swan type:thermodilution Procedure performed without using ultrasound guided technique. Attempts: 1 Patient tolerated the procedure well with no immediate complications. Additional procedure comments: Unable to float PA successfully in preop. Swan locked at 20cm, will reattempt floating into PA in OR with TEE guidance.

## 2022-04-23 ENCOUNTER — Inpatient Hospital Stay (HOSPITAL_COMMUNITY): Payer: 59

## 2022-04-23 ENCOUNTER — Encounter (HOSPITAL_COMMUNITY): Payer: Self-pay | Admitting: Thoracic Surgery (Cardiothoracic Vascular Surgery)

## 2022-04-23 DIAGNOSIS — E039 Hypothyroidism, unspecified: Secondary | ICD-10-CM | POA: Diagnosis not present

## 2022-04-23 DIAGNOSIS — R739 Hyperglycemia, unspecified: Secondary | ICD-10-CM | POA: Diagnosis not present

## 2022-04-23 DIAGNOSIS — G40909 Epilepsy, unspecified, not intractable, without status epilepticus: Secondary | ICD-10-CM | POA: Diagnosis not present

## 2022-04-23 DIAGNOSIS — Z952 Presence of prosthetic heart valve: Secondary | ICD-10-CM | POA: Diagnosis not present

## 2022-04-23 LAB — MAGNESIUM
Magnesium: 2.4 mg/dL (ref 1.7–2.4)
Magnesium: 2.1 mg/dL (ref 1.7–2.4)

## 2022-04-23 LAB — BASIC METABOLIC PANEL
Anion gap: 9 (ref 5–15)
BUN: 16 mg/dL (ref 8–23)
CO2: 23 mmol/L (ref 22–32)
Calcium: 8.1 mg/dL — ABNORMAL LOW (ref 8.9–10.3)
Chloride: 106 mmol/L (ref 98–111)
Creatinine, Ser: 1.28 mg/dL — ABNORMAL HIGH (ref 0.61–1.24)
GFR, Estimated: >60 mL/min (ref >60)
Glucose, Bld: 123 mg/dL — ABNORMAL HIGH (ref 70–99)
Potassium: 4.2 mmol/L (ref 3.5–5.1)
Sodium: 138 mmol/L (ref 135–145)

## 2022-04-23 LAB — CBC
HCT: 25.4 % — ABNORMAL LOW (ref 39.0–52.0)
HCT: 23.4 % — ABNORMAL LOW (ref 39.0–52.0)
Hemoglobin: 9.1 g/dL — ABNORMAL LOW (ref 13.0–17.0)
Hemoglobin: 7.9 g/dL — ABNORMAL LOW (ref 13.0–17.0)
MCH: 31.8 pg (ref 26.0–34.0)
MCH: 31.2 pg (ref 26.0–34.0)
MCHC: 35.8 g/dL (ref 30.0–36.0)
MCHC: 33.8 g/dL (ref 30.0–36.0)
MCV: 88.8 fL (ref 80.0–100.0)
MCV: 92.5 fL (ref 80.0–100.0)
Platelets: 145 K/uL — ABNORMAL LOW (ref 150–400)
Platelets: 121 10*3/uL — ABNORMAL LOW (ref 150–400)
RBC: 2.86 MIL/uL — ABNORMAL LOW (ref 4.22–5.81)
RBC: 2.53 MIL/uL — ABNORMAL LOW (ref 4.22–5.81)
RDW: 12.9 % (ref 11.5–15.5)
RDW: 13.2 % (ref 11.5–15.5)
WBC: 14 K/uL — ABNORMAL HIGH (ref 4.0–10.5)
WBC: 17.3 10*3/uL — ABNORMAL HIGH (ref 4.0–10.5)
nRBC: 0 % (ref 0.0–0.2)
nRBC: 0 % (ref 0.0–0.2)

## 2022-04-23 LAB — SURGICAL PATHOLOGY

## 2022-04-23 LAB — GLUCOSE, CAPILLARY
Glucose-Capillary: 118 mg/dL — ABNORMAL HIGH (ref 70–99)
Glucose-Capillary: 123 mg/dL — ABNORMAL HIGH (ref 70–99)
Glucose-Capillary: 127 mg/dL — ABNORMAL HIGH (ref 70–99)
Glucose-Capillary: 127 mg/dL — ABNORMAL HIGH (ref 70–99)
Glucose-Capillary: 132 mg/dL — ABNORMAL HIGH (ref 70–99)
Glucose-Capillary: 132 mg/dL — ABNORMAL HIGH (ref 70–99)
Glucose-Capillary: 133 mg/dL — ABNORMAL HIGH (ref 70–99)
Glucose-Capillary: 134 mg/dL — ABNORMAL HIGH (ref 70–99)
Glucose-Capillary: 138 mg/dL — ABNORMAL HIGH (ref 70–99)
Glucose-Capillary: 145 mg/dL — ABNORMAL HIGH (ref 70–99)
Glucose-Capillary: 147 mg/dL — ABNORMAL HIGH (ref 70–99)

## 2022-04-23 LAB — BASIC METABOLIC PANEL WITH GFR
Anion gap: 6 (ref 5–15)
BUN: 24 mg/dL — ABNORMAL HIGH (ref 8–23)
CO2: 25 mmol/L (ref 22–32)
Calcium: 7.4 mg/dL — ABNORMAL LOW (ref 8.9–10.3)
Chloride: 105 mmol/L (ref 98–111)
Creatinine, Ser: 1.76 mg/dL — ABNORMAL HIGH (ref 0.61–1.24)
GFR, Estimated: 42 mL/min — ABNORMAL LOW
Glucose, Bld: 119 mg/dL — ABNORMAL HIGH (ref 70–99)
Potassium: 4.1 mmol/L (ref 3.5–5.1)
Sodium: 136 mmol/L (ref 135–145)

## 2022-04-23 MED ORDER — ENOXAPARIN SODIUM 40 MG/0.4ML IJ SOSY
40.0000 mg | PREFILLED_SYRINGE | INTRAMUSCULAR | Status: DC
  Administered 2022-04-23 – 2022-04-26 (×4): 40 mg via SUBCUTANEOUS
  Filled 2022-04-23 (×4): qty 0.4

## 2022-04-23 MED ORDER — VASOPRESSIN 20 UNITS/100 ML INFUSION FOR SHOCK
0.0000 [IU]/min | INTRAVENOUS | Status: DC
  Administered 2022-04-23 – 2022-04-24 (×2): 0.03 [IU]/min via INTRAVENOUS
  Filled 2022-04-23 (×2): qty 100

## 2022-04-23 MED ORDER — VASOPRESSIN 20 UNITS/100 ML INFUSION FOR SHOCK
INTRAVENOUS | Status: AC
  Filled 2022-04-23: qty 100

## 2022-04-23 MED ORDER — INSULIN ASPART 100 UNIT/ML IJ SOLN
2.0000 [IU] | INTRAMUSCULAR | Status: DC
  Administered 2022-04-23 – 2022-04-24 (×4): 2 [IU] via SUBCUTANEOUS

## 2022-04-23 MED ORDER — ORAL CARE MOUTH RINSE: 15.0000 mL | OROMUCOSAL | Status: DC | PRN

## 2022-04-23 MED ORDER — FUROSEMIDE 10 MG/ML IJ SOLN
40.0000 mg | Freq: Once | INTRAMUSCULAR | Status: AC
  Administered 2022-04-23: 40 mg via INTRAVENOUS
  Filled 2022-04-23: qty 4

## 2022-04-23 MED ORDER — INSULIN DETEMIR 100 UNIT/ML ~~LOC~~ SOLN
5.0000 [IU] | Freq: Two times a day (BID) | SUBCUTANEOUS | Status: DC
  Administered 2022-04-23 (×2): 5 [IU] via SUBCUTANEOUS
  Filled 2022-04-23 (×4): qty 0.05

## 2022-04-23 NOTE — Consult Note (Signed)
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  Post operatively the patient remained intubated per plan and was admitted to ICU  Operative course unremarkable. No dysrhythmias. Variable neo requirement.  Cross clamp time 72 min Total pump time 99 min  EBL 1090 Cell saver 620   PCCM consulted in this setting    Pertinent  Medical History  CAD Aortic regurg Brain abscess Seizure disorder PFO s/p repair Dvt PE   Significant Hospital Events: Including procedures, antibiotic start and stop dates in addition to other pertinent events   10/11 CABG and AVR. Remains intubated after case. Anatomy not amenable to swan. PCCM consulted  Interim History / Subjective:  Ate a small breakfast, having some sternal pain today.  Objective   Blood pressure 102/73, pulse 84, temperature 99.5 F (37.5 C), resp. rate (!) 25, height '5\' 7"'$  (1.702 m), weight 91.3 kg, SpO2 96 %.    Vent Mode: PSV;CPAP FiO2 (%):  [40 %-50 %] 40 % Set Rate:  [4 bmp-14 bmp] 4 bmp Vt Set:  [520 mL] 520 mL PEEP:  [5 cmH20] 5 cmH20 Pressure Support:  [10 cmH20] 10 cmH20 Plateau Pressure:  [13 cmH20] 13 cmH20   Intake/Output Summary (Last 24 hours) at 04/23/2022 0851 Last data filed at 04/23/2022 0800 Gross per 24 hour  Intake 6375.81 ml  Output 2605 ml  Net 3770.81 ml    Filed Weights   04/22/22 0708 04/23/22 0439  Weight: 85.3 kg 91.3 kg    Examination: General: elderly man lying in bed in NAD HENT: [place]/AT, eyes anicteric Lungs: breathing comfortably on Ramos, CTAB.  Cardiovascular:  S1S2, RRR, not requiring pacing  Abdomen: soft, NT  Extremities: no c/c/e Neuro: awake, alert,  answering questions appropriately, moving alle extremities spontaneously. GU: foley with yellow urine  BUN 16 Cr 1.28 WBC 14 H/H 9.1/25.4 Platelets 145 CXR personally reviewed>  cardiomegaly, small left effusion  Resolved Hospital Problem list     Assessment & Plan:   Severe aortic regurg s/p AVR CAD s/p CABG x 1 SVG to RCA Expected post operative ventilator management Expected post op ABLA  Shock -- multifactorial: cardiogenic, post-bypass vasoplegia, volume shifts> resolved -CXR personally reviewed, ETT in good position. L basilar opacity  P -wires and tubes to be removed today -start metoprolol today -aspirin, statin -defer transfusions to TCTS -pulmonary hygiene -OOB mobility  Hyperglycemia -transition to basal bolus insulin -SSI PRN -goal BG <180  Hx OSA -in review of last pulm note with Dr. 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    Best Practice (right click and "Reselect all SmartList Selections" daily)   Diet/type: Regular consistency (see orders) DVT prophylaxis: not indicated GI prophylaxis: PPI Lines: Central line and Arterial Line Foley:  Yes, and it  is still needed Code Status:  full code Last date of multidisciplinary goals of care discussion '[ ]'$   Labs   CBC: Recent Labs  Lab 04/20/22 1121 04/22/22 0923 04/22/22 1125 04/22/22 1129 04/22/22 1339 04/22/22 1340 04/22/22 1817 04/22/22 2202 04/22/22 2331 04/23/22 0305  WBC 8.2  --   --   --  20.4*  --  18.2*  --   --  14.0*  HGB 14.7   < > 9.9*    < > 10.6* 10.2* 10.3* 8.8* 8.8* 9.1*  HCT 43.6   < > 27.5*   < > 31.1* 30.0* 30.0* 26.0* 26.0* 25.4*  MCV 91.2  --   --   --  91.2  --  90.4  --   --  88.8  PLT 249  --  162  --  156  --  169  --   --  145*   < > = values in this interval not displayed.     Basic Metabolic Panel: Recent Labs  Lab 04/20/22 1121 04/22/22 0923 04/22/22 1001 04/22/22 1028 04/22/22 1059 04/22/22 1129 04/22/22 1210 04/22/22 1340 04/22/22 1817 04/22/22 2202 04/22/22 2331 04/23/22 0305  NA 139   < > 140   < > 138   < > 137 140 138 139 140 138  K 3.8   < > 3.8   < > 5.0   < > 5.2* 4.2 4.4 4.2 4.2 4.2  CL 109   < > 104  --  101  --  100  --  108  --   --  106  CO2 23  --   --   --   --   --   --   --  24  --   --  23  GLUCOSE 113*   < > 98  --  101*  --  141*  --  117*  --   --  123*  BUN 17   < > 16  --  18  --  17  --  15  --   --  16  CREATININE 1.26*   < > 0.90  --  1.00  --  1.00  --  1.11  --   --  1.28*  CALCIUM 9.2  --   --   --   --   --   --   --  7.8*  --   --  8.1*  MG  --   --   --   --   --   --   --   --  3.1*  --   --  2.4   < > = values in this interval not displayed.    GFR: Estimated Creatinine Clearance: 62 mL/min (A) (by C-G formula based on SCr of 1.28 mg/dL (H)). Recent Labs  Lab 04/20/22 1121 04/22/22 1339 04/22/22 1817 04/23/22 0305  WBC 8.2 20.4* 18.2* 14.0*      Julian Hy, DO 04/23/22 9:06 AM [facility]

## 2022-04-23 NOTE — Discharge Summary (Signed)
[address]       [place] [phone]             [phone]    Physician Discharge Summary  Patient ID: YONATAN GUITRON MRN: [id number] DOB/AGE: 1955-10-25 66 y.o.  Admit date: 04/22/2022 Discharge date: 05/04/2022  Admission Diagnoses:  Patient Active Problem List   Diagnosis Date Noted   On mechanically assisted ventilation ([place])    Nonrheumatic aortic valve insufficiency    Non-ischemic cardiomyopathy ([place])    Coronary artery disease involving native coronary artery of native heart without angina pectoris    Seasonal and perennial allergic rhinitis 05/25/2016   Asthmatic bronchitis 05/22/2015   GERD (gastroesophageal reflux disease) 01/28/2014   Other pulmonary embolism and infarction 01/28/2014   Cerebral mass 04/01/2012   S/P appendectomy 12/08/2011   Obstructive sleep apnea 01/28/2007   HYPERTENSION, BENIGN ESSENTIAL 01/28/2007   KIDNEY DISEASE, CHRONIC, STAGE III 01/28/2007     Discharge Diagnoses:  Patient Active Problem List   Diagnosis Date Noted   Atrial fibrillation ([place]) 04/30/2022   History of cardioversion 04/30/2022   S/P AVR 04/22/2022   S/P CABG x 1    ABLA (acute blood loss anemia)    On mechanically assisted ventilation ([facility])    Nonrheumatic aortic valve insufficiency    Non-ischemic cardiomyopathy ([place])    Coronary artery disease involving native coronary artery of native heart without angina pectoris    Seasonal and perennial allergic rhinitis 05/25/2016   Asthmatic bronchitis 05/22/2015   GERD (gastroesophageal reflux disease) 01/28/2014   Other pulmonary embolism and infarction 01/28/2014   Cerebral mass 04/01/2012   S/P appendectomy 12/08/2011   Obstructive sleep apnea 01/28/2007   HYPERTENSION, BENIGN ESSENTIAL 01/28/2007   KIDNEY DISEASE, CHRONIC, STAGE III 01/28/2007     Discharged Condition: good  History of Present Illness:  [name] is a 66 yo wm with a significant PMH of an 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 Pt has been doing fairly well after these issues and actually power lifts and aggressively using training bike. He reports over the past few months he has been more fatigued. He has no lower ext edema or lightheadedness. He has no palpitations or CP. He has been followed very closely by cardiology and has been having serial echos for known AI and ascending aortic aneurysm. He has recently been found to have worsening AI to know grade severe with dilation of the LV. He has mild Mr and mild to moderate TR. He as an EF of 45%. He was felt with increasing AI and dilation of LV with progressive symptoms to require AVR and was taken to the cath lab where he was found not to have PHTN however a proximal RCA lesion of over 70%. (Which I have personally reviewed and concur with findings). His aortic aneurysm has not been measured over 4.2 cm and has been confirmed by CT. Pt presents for discussion of surgical options moving forward with his cardiac issues. He does have Stage III Renal insufficiency in addition to above. The patient was evaluated by Dr. Lavonna Monarch who was in agreement he would require Aortic Valve Replacement and Coronary bypass grafting.  The risks and benefits of the procedure were explained to the patient and he was agreeable to proceed.  Hospital Course:  [name] presented to [facility] on  04/22/2022.  He was taken to the operating room and underwent Aortic Valve Replacement with a 25 mm [employer] Resilia Bioprosthetic Valve and Coronary bypass grafting x 1 utilizing SVG to RCA.  He tolerated the procedure without difficulty and was taken to the SICU in stable condition. He was extubated the evening of surgery. He was weaned off drips as tolerated. His chest tubes and arterial line were removed without  complication. He was restarted on vasopressin for hypotension. He developed Atrial Fibrillation with RVR.  He was not treated with Amiodarone due to allergy to Iodine.  He was started on Cardizem drip which had to be increased due to continued episodes.  His Lopressor dose was also increased for additional HR control.  He developed URI symptoms and has a family member who was positive for COVID.  Due to this repeat COVID test was obtained and was negative.  He was hypokalemic and his potassium supplementation was increased.  EP consultation was obtained who made adjustments to his Cardizem regimen.  His Lopressor dose was also increased.  They felt if his atrial Fibrillation persisted he could undergo cardioversion in the future. Once he was off all drips he was started on diuretics for volume overloaded state.  The patient again converted to Atrial Fibrillation.  He underwent Cardioversion on 04/30/2022.  The patient was then loaded and started on Tikosyn therapy. The dose was adjusted as needed.  He developed elevation in his creatinine level.  Diuretics were discontinued and repeat level improved.  His Qtc remains stable.  He is ambulating without difficulty.  His surgical incisions are healing without evidence of infection.  He is medically stable for discharge home today.  Consults:  EP  Significant Diagnostic Studies:  Transthoracic Echocardiogram   Patient: [name] MR Number: Study Date: 12-Jan-2006    ---------------------------------------------------------------   Gender:      M   Age:         76 years   DOB:         1955-12-08   Height:   Weight:   BSA:   Pt. Status:   Room:        [place]   PERFORMING   [facility]   SONOGRAPHER  [facility]   ORDERING     [place]    ---------------------------------------------------------------   INDICATIONS:   Follow up pericardial effusion.   DIAGNOSES SUPPORTING MEDICAL  NECESSITY:   Patent foramen ovale 745.5   HISTORY:   Asthma. Cerebral Mass. Chronic lung disease.    ---------------------------------------------------------------   PROCEDURE INFORMATION:   A transthoracic limited/follow-up 2D study was performed. This was a   routine echocardiographic study. The study was performed in the echo   lab. The patient reported no pain pre or post test. Image quality   was adequate.    ---------------------------------------------------------------   2D Measurements   LEFT VENTRICLE                 NORMAL   LVID ed (chordal)  44.1   mm   40-48 mm   LVID es (chordal)  31.8   mm   27-35 mm   FS (chordal)       28     %    >29%   IVS ed             12.9   mm   7-10  mm   LVPW ed            8.6    mm   7-10 mm   AORTA                          NORMAL   AoD (root)         37.58  mm   --   LEFT ATRIUM                    NORMAL   LAD                38     mm   --    ---------------------------------------------------------------   LEFT VENTRICLE:   -  Left ventricular size was normal.   -  Overall left ventricular systolic function was normal.   AORTIC VALVE:   -  The aortic valve was trileaflet.   -  Aortic valve thickness was normal.   AORTA:   -  Prominent aortic root and ascending aorta over riding the         interatrial septum.   -  The aortic root was normal in size.   -  The size of the ascending aorta was normal.   MITRAL VALVE:   -  Mitral valve structure was normal.    Doppler interpretation(s):   -  There was trivial mitral valvular regurgitation.   LEFT ATRIUM:   -  Left atrial size was normal.   -  There was no left atrial thrombus identified.   -  The position of the device appeared satisfactory.    Doppler interpretation(s):   -  There was a catheter atrial septal defect occlusion device         closure with no identified residual left to right shunting.   RIGHT VENTRICLE:   -  Right ventricular size was normal.   -  Right  ventricular systolic function was normal.   RIGHT ATRIUM:   -  The shape to the right atrium appears to have been compressed and         the appearence is probably because of prominent aorta which         has been described in patients with Platypnoea OrthoDeoxia         Syndrome. Clinical correlation recomended.   -  The right atrium was small.   PERICARDIUM:   -  There was no pericardial effusion.   -  The pericardium was normal in appearance.    ---------------------------------------------------------------   SUMMARY   -  Overall left ventricular systolic function was normal.   -  Prominent aortic root and ascending aorta over riding the         interatrial septum.   -  There was a catheter atrial septal defect occlusion device         closure with no identified residual left to right shunting.   -  The shape to the right atrium appears to have been compressed and         the appearence is probably because of prominent aorta which         has been described in patients with Platypnoea OrthoDeoxia         Syndrome. Clinical correlation recomended.    ---------------------------------------------------------------   Prepared and Electronically Authenticated by   Adrian Prows M.D.   Confirmed 14-Jan-2006 12:56:31  Right Left Heart Catheterization 03/10/22:  RA 7/5, mean 5 mmHg RV 39/5, EDP 10 mmHg PA 36/17, mean 26 mmHg. PW 21/21, mean 18 mmHg. QP/QS 1.0.  PVR 1.70 Wood units.   LV 155/13, EDP 30 mmHg. Ao 143/69, mean 100 mmHg.  There was no pressure gradient across the aortic valve.   RCA: Anterior origin.  Dominant and continues to the PDA gives origin to very small PL branches, proximal segment has a 70% stenosis which is focal.  Distal RCA has a tandem 30% stenosis.  Mild disease is present throughout the RCA. LM: Large caliber vessel.  No significant disease. LAD: Large vessel giving origin to a very large D1.  There is mild 20 to 30% stenosis in the proximal to mid segment  of the LAD.  Mild disease in the ostium of the D1. LCx: Very large caliber vessel giving origin to a large OM1 which has secondary branches.  Circumflex continues in the AV groove, OM1 is large with mild disease.  Proximal segment has mild ectasia and a 10 to 20% stenosis.   Impression: Mild pulmonary hypertension secondary to elevated EDP, single-vessel coronary artery disease that is of significance involving the right coronary artery which is moderate-sized vessel.  Mild disease is evident in the LAD and CX.   Recommendation: We will discuss with surgical colleagues regarding aortic valve replacement and possible consideration of single-vessel bypass surgery to the right coronary artery versus medical therapy for the same.  55 mL contrast utilized.   Treatments: surgery:  CARDIOVASCULAR SURGERY OPERATIVE NOTE   04/22/2022 AARIB PULIDO [id number]   Surgeon:  Everlena Cooper, MD   First Assistant: Ellwood Handler  [facility]     Preoperative Diagnosis:  Severe aortic regurgitation                                             Coronary artery disease     Postoperative Diagnosis:  Same     Procedure:   Median Sternotomy Extracorporeal circulation 3.   Aortic valve replacement using a 45m Inspiris Pericardial valve.(Serial #[id number]   4.   Coronary artery bypass x 1 with RSVG from aorta to RCA   Discharge Exam: Blood pressure 112/72, pulse 68, temperature 98.1 F (36.7 C), temperature source Oral, resp. rate 18, height 5' 7"  (1.702 m), weight 83.9 kg, SpO2 97 %.  General appearance: alert, cooperative, and no distress Heart: regular rate and rhythm Lungs: clear to auscultation bilaterally Abdomen: soft, non-tender; bowel sounds normal; no masses,  no organomegaly Extremities: edema trace Wound: clean and dry     Discharge Medications:  The patient has been discharged on:   1.Beta Blocker:  Yes [ x  ]                              No   [   ]                              If  No, reason:  2.Ace Inhibitor/ARB: Yes [   ]                                     No  [  x  ]  If No, reason: AKI  3.Statin:   Yes Valu.Nieves   ]                  No  [   ]                  If No, reason:  4.Ecasa:  Yes  [ X  ]                  No   [   ]                  If No, reason:  Patient had ACS upon admission: No  Plavix/P2Y12 inhibitor: Yes [   ]                                      No  [ X  ]     Discharge Instructions     Amb Referral to Cardiac Rehabilitation   Complete by: As directed    Diagnosis:  CABG Valve Replacement     Valve: Aortic   CABG X ___: 1   After initial evaluation and assessments completed: Virtual Based Care may be provided alone or in conjunction with Phase 2 Cardiac Rehab based on patient barriers.: Yes   Intensive Cardiac Rehabilitation (ICR) [place] location only OR Traditional Cardiac Rehabilitation (TCR) *If criteria for ICR are not met will enroll in TCR [facility] only): Yes      Allergies as of 05/04/2022       Reactions   Iodinated Contrast Media Anaphylaxis   Iohexol Anaphylaxis, Shortness Of Breath    Code: HIVES, Desc: hives and tachycardia last time pt received IV CM kdean 07/19/06, Onset Date: [id number]   On 9/9 pt had a ct head w/ and received 13hr premedication. Was fine after scan, but broke out in hives when he got home.  Took 196m benedryl at home PO, and was fine.   [place] Thyroid [thyroid]    mouth swelling   Codeine    agitation   Dilantin [phenytoin] Other (See Comments)   Destroyed Blood platelets    Heparin Hives   On going therapy    Hydrocodone    Agitation   Nuvigil [armodafinil]    rash   Other    Follows Kosher Diet; Needs Kosher products   Shellfish Allergy Other (See Comments)   Mouth tingles   Flagyl [metronidazole] Rash   Rocephin [ceftriaxone] Rash        Medication List     STOP taking these medications    escitalopram 10 MG tablet Commonly known as: LEXAPRO    irbesartan 150 MG tablet Commonly known as: AVAPRO       TAKE these medications    acetaminophen 500 MG tablet Commonly known as: TYLENOL Take 1-2 tablets (500-1,000 mg total) by mouth every 6 (six) hours as needed for moderate pain.   albuterol 108 (90 Base) MCG/ACT inhaler Commonly known as: VENTOLIN HFA Inhale 2 puffs into the lungs every 6 (six) hours as needed for wheezing or shortness of breath.   apixaban 5 MG Tabs tablet Commonly known as: ELIQUIS Take 1 tablet (5 mg total) by mouth 2 (two) times daily.   aspirin EC 81 MG tablet Take 1 tablet (81 mg total) by mouth daily. Swallow whole.   azelastine 0.1 % nasal spray Commonly known as: ASTELIN Place 1 spray  into both nostrils 2 (two) times daily as needed for allergies.   dapagliflozin propanediol 10 MG Tabs tablet Commonly known as: FARXIGA Take 1 tablet (10 mg total) by mouth daily.   diltiazem 90 MG tablet Commonly known as: CARDIZEM Take 1 tablet (90 mg total) by mouth every 6 (six) hours.   dofetilide 250 MCG capsule Commonly known as: TIKOSYN Take 1 capsule (250 mcg total) by mouth 2 (two) times daily.   fluticasone-salmeterol 230-21 MCG/ACT inhaler Commonly known as: ADVAIR HFA Inhale 2 puffs into the lungs 2 (two) times daily.   furosemide 40 MG tablet Commonly known as: Lasix Take 1 tablet (40 mg total) by mouth daily as needed. For weight gain of 3 lbs in 24 hours or 5 lbs in 48   levETIRAcetam 500 MG tablet Commonly known as: KEPPRA Take 500 mg by mouth 2 (two) times daily.   levothyroxine 50 MCG tablet Commonly known as: SYNTHROID TAKE 1 TABLET BY MOUTH  DAILY What changed:  how much to take when to take this additional instructions   metoprolol succinate 100 MG 24 hr tablet Commonly known as: TOPROL-XL Take 1 tablet (100 mg total) by mouth 2 (two) times daily. Take with or immediately following a meal.   pantoprazole 40 MG tablet Commonly known as: PROTONIX Take 40 mg by mouth  every other day.   potassium chloride SA 20 MEQ tablet Commonly known as: KLOR-CON M Take 1 tablet (20 mEq total) by mouth daily as needed. On days you take Lasix   rosuvastatin 40 MG tablet Commonly known as: CRESTOR Take 1 tablet (40 mg total) by mouth daily. What changed:  medication strength how much to take   sertraline 25 MG tablet Commonly known as: ZOLOFT Take 1 tablet (25 mg total) by mouth daily.   tamsulosin 0.4 MG Caps capsule Commonly known as: FLOMAX Take 1 capsule (0.4 mg total) by mouth daily.   traMADol 50 MG tablet Commonly known as: ULTRAM Take 1 tablet (50 mg total) by mouth every 6 (six) hours as needed for severe pain.        Follow-up Information     [facility] [place] Follow up on 05/11/2022.   Specialty: Cardiothoracic Surgery Why: Appointment is at 1:00 Contact information: [place], [place] Follow up on 05/11/2022.   Why: Appointment is at 12:00 for chest xray Contact information: [place]        Adrian Prows, MD. Schedule an appointment as soon as possible for a visit.   Specialty: Cardiology Why: please contact office to set up Follow up at your convenience Contact information: [place] [address] [phone]                 Signed:  Ellwood Handler, PA-C  05/04/2022, 7:39 AM

## 2022-04-23 NOTE — Progress Notes (Signed)
[address]       [place] [address]             [phone]      1 Day Post-Op  Procedure(s) (LRB): AORTIC VALVE REPLACEMENT (AVR) USING 25 MM INSPIRIS RESILIA  AORTIC VALVE (N/A) CORONARY ARTERY BYPASS GRAFTING (CABG) TIMES ONE USE THE ENDOSCOPICALLY HARVESTED RIGHT GREATER SAPHENOUS VEIN (N/A) TRANSESOPHAGEAL ECHOCARDIOGRAM (TEE) (N/A)   Total Length of Stay:  LOS: 1 day    SUBJECTIVE: Has pain issues with sternotomy Tolerated yogurt  Vitals:   04/23/22 0745 04/23/22 0800  BP:  102/73  Pulse: 83 84  Resp: (!) 23 (!) 25  Temp:    SpO2: 94% 96%    Intake/Output      10/11 0701 10/12 0700 10/12 0701 10/13 0700   P.O. 80    I.V. (mL/kg) 3408.3 (37.3) 49.6 (0.5)   Blood 620    IV Piggyback 2317.9    Total Intake(mL/kg) 6426.2 (70.4) 49.6 (0.5)   Urine (mL/kg/hr) 1745 (0.8) 20 (0.1)   Blood 400    Chest Tube 430 10   Total Output 2575 30   Net +3851.2 +19.6            sodium chloride 10 mL/hr at 04/23/22 0800   sodium chloride     albumin human Stopped (04/23/22 0236)    ceFAZolin (ANCEF) IV Stopped (04/23/22 0537)   dexmedetomidine (PRECEDEX) IV infusion Stopped (04/22/22 1627)   famotidine (PEPCID) IV Stopped (04/22/22 1421)   insulin 0.8 Units/hr (04/23/22 0800)   lactated ringers     lactated ringers     lactated ringers 20 mL/hr at 04/23/22 0800   nitroGLYCERIN     phenylephrine (NEO-SYNEPHRINE) Adult infusion 25 mcg/min (04/23/22 0800)    CBC    Component Value Date/Time   WBC 14.0 (H) 04/23/2022 0305   RBC 2.86 (L) 04/23/2022 0305   HGB 9.1 (L) 04/23/2022 0305   HGB 14.2 03/03/2022 0812   HCT 25.4 (L) 04/23/2022 0305   HCT 42.8 03/03/2022 0812   PLT 145 (L) 04/23/2022 0305   PLT 243 03/03/2022 0812   MCV 88.8 04/23/2022 0305   MCV 94 03/03/2022 0812   MCH 31.8 04/23/2022 0305   MCHC 35.8 04/23/2022 0305   RDW 12.9 04/23/2022 0305   RDW 13.2 03/03/2022 0812   LYMPHSABS 1.7 06/19/2020 0934   MONOABS 0.7  06/19/2020 0934   EOSABS 0.1 06/19/2020 0934   BASOSABS 0.0 06/19/2020 0934   CMP     Component Value Date/Time   NA 138 04/23/2022 0305   NA 141 03/03/2022 0812   K 4.2 04/23/2022 0305   CL 106 04/23/2022 0305   CO2 23 04/23/2022 0305   GLUCOSE 123 (H) 04/23/2022 0305   BUN 16 04/23/2022 0305   BUN 19 03/03/2022 0812   CREATININE 1.28 (H) 04/23/2022 0305   CALCIUM 8.1 (L) 04/23/2022 0305   PROT 6.9 04/20/2022 1121   PROT 6.7 03/03/2022 0812   ALBUMIN 4.1 04/20/2022 1121   ALBUMIN 4.5 03/03/2022 0812   AST 22 04/20/2022 1121   ALT 17 04/20/2022 1121   ALKPHOS 54 04/20/2022 1121   BILITOT 0.8 04/20/2022 1121   BILITOT 0.7 03/03/2022 0812   GFRNONAA >60 04/23/2022 0305   GFRAA 61 (L) 03/22/2012 0949   ABG    Component Value Date/Time   PHART 7.365 04/22/2022 2331   PCO2ART 40.7 04/22/2022 2331   PO2ART 147 (H) 04/22/2022 2331   HCO3 23.2  04/22/2022 2331   TCO2 24 04/22/2022 2331   ACIDBASEDEF 2.0 04/22/2022 2331   O2SAT 99 04/22/2022 2331   CBG (last 3)  Recent Labs    04/23/22 0306 04/23/22 0456 04/23/22 0732  GLUCAP 127* 127* 132*   EXAM: Lungs: decreased at bases, CT without air leak Card: RR with no murmurs Ext: warm and dry  ASSESSMENT: SP AVR and CABGx1 Doing well hemodynamically. Off neo CXR clear Will DC art line and chest tubes, keep PWs ASA for valve and bypass as long as no afib Start amio prophylaxis Start lasix tomorrow Assess for transfer to [facility] later today    Coralie Common, MD '@DATE'$ @

## 2022-04-23 NOTE — Progress Notes (Signed)
EVENING ROUNDS NOTE :     [place]       ,[place] [address]             [phone]                 1 Day Post-Op Procedure(s) (LRB): AORTIC VALVE REPLACEMENT (AVR) USING 25 MM INSPIRIS RESILIA  AORTIC VALVE (N/A) CORONARY ARTERY BYPASS GRAFTING (CABG) TIMES ONE USE THE ENDOSCOPICALLY HARVESTED RIGHT GREATER SAPHENOUS VEIN (N/A) TRANSESOPHAGEAL ECHOCARDIOGRAM (TEE) (N/A)   Total Length of Stay:  LOS: 1 day  Events:   No events Resting comfortably    BP (!) 88/65   Pulse 78   Temp 98.2 F (36.8 C) (Oral)   Resp (!) 22   Ht '5\' 7"'$  (1.702 m)   Wt 91.3 kg   SpO2 96%   BMI 31.52 kg/m      Vent Mode: PSV;CPAP FiO2 (%):  [40 %-50 %] 40 % Set Rate:  [4 bmp] 4 bmp Vt Set:  [520 mL] 520 mL PEEP:  [5 cmH20] 5 cmH20 Pressure Support:  [10 cmH20] 10 cmH20   sodium chloride Stopped (04/23/22 1246)   sodium chloride      ceFAZolin (ANCEF) IV Stopped (04/23/22 1449)   insulin Stopped (04/23/22 1231)   lactated ringers     lactated ringers     lactated ringers 20 mL/hr at 04/23/22 1700   nitroGLYCERIN      I/O last 3 completed shifts: In: 6426.2 [P.O.:80; I.V.:3408.3; Blood:620; IV Piggyback:2317.9] Out: 2575 [ASNKN:3976; Blood:400; Chest Tube:430]      Latest Ref Rng & Units 04/23/2022    4:24 PM 04/23/2022    3:05 AM 04/22/2022   11:31 PM  CBC  WBC 4.0 - 10.5 K/uL 17.3  14.0    Hemoglobin 13.0 - 17.0 g/dL 7.9  9.1  8.8   Hematocrit 39.0 - 52.0 % 23.4  25.4  26.0   Platelets 150 - 400 K/uL 121  145         Latest Ref Rng & Units 04/23/2022    3:05 AM 04/22/2022   11:31 PM 04/22/2022   10:02 PM  BMP  Glucose 70 - 99 mg/dL 123     BUN 8 - 23 mg/dL 16     Creatinine 0.61 - 1.24 mg/dL 1.28     Sodium 135 - 145 mmol/L 138  140  139   Potassium 3.5 - 5.1 mmol/L 4.2  4.2  4.2   Chloride 98 - 111 mmol/L 106     CO2 22 - 32 mmol/L 23     Calcium 8.9 - 10.3 mg/dL 8.1       ABG    Component Value Date/Time   PHART 7.365 04/22/2022 2331    PCO2ART 40.7 04/22/2022 2331   PO2ART 147 (H) 04/22/2022 2331   HCO3 23.2 04/22/2022 2331   TCO2 24 04/22/2022 2331   ACIDBASEDEF 2.0 04/22/2022 2331   O2SAT 99 04/22/2022 2331       Melodie Bouillon, MD 04/23/2022 5:58 PM

## 2022-04-23 NOTE — Discharge Instructions (Addendum)
Discharge Instructions:  1. You may shower, please wash incisions daily with soap and water and keep dry.  If you wish to cover wounds with dressing you may do so but please keep clean and change daily.  No tub baths or swimming until incisions have completely healed.  If your incisions become red or develop any drainage please call our office at [phone]  2. No Driving until cleared by Dr. Nicholos Johns office and you are no longer using narcotic pain medications  3. Monitor your weight daily.. Please use the same scale and weigh at same time... If you gain 5-10 lbs in 48 hours with associated lower extremity swelling, please contact our office at [phone]  4. Fever of 101.5 for at least 24 hours with no source, please contact our office at [phone]  5. Activity- up as tolerated, please walk at least 3 times per day.  Avoid strenuous activity, no lifting, pushing, or pulling with your arms over 8-10 lbs for a minimum of 6 weeks  6. If any questions or concerns arise, please do not hesitate to contact our office at [phone]  Information on my medicine - ELIQUIS (apixaban)  This medication education was reviewed with me or my healthcare representative as part of my discharge preparation.  The pharmacist that spoke with me during my hospital stay was:  Ursula Beath, [facility]  Why was Eliquis prescribed for you? Eliquis was prescribed for you to reduce the risk of a blood clot forming that can cause a stroke if you have a medical condition called atrial fibrillation (a type of irregular heartbeat).  What do You need to know about Eliquis ? Take your Eliquis TWICE DAILY - one tablet in the morning and one tablet in the evening with or without food. If you have difficulty swallowing the tablet whole please discuss with your pharmacist how to take the medication safely.  Take Eliquis exactly as prescribed by your doctor and DO NOT stop taking Eliquis without talking to the doctor who  prescribed the medication.  Stopping may increase your risk of developing a stroke.  Refill your prescription before you run out.  After discharge, you should have regular check-up appointments with your healthcare provider that is prescribing your Eliquis.  In the future your dose may need to be changed if your kidney function or weight changes by a significant amount or as you get older.  What do you do if you miss a dose? If you miss a dose, take it as soon as you remember on the same day and resume taking twice daily.  Do not take more than one dose of ELIQUIS at the same time to make up a missed dose.  Important Safety Information A possible side effect of Eliquis is bleeding. You should call your healthcare provider right away if you experience any of the following: Bleeding from an injury or your nose that does not stop. Unusual colored urine (red or dark brown) or unusual colored stools (red or black). Unusual bruising for unknown reasons. A serious fall or if you hit your head (even if there is no bleeding).  Some medicines may interact with Eliquis and might increase your risk of bleeding or clotting while on Eliquis. To help avoid this, consult your healthcare provider or pharmacist prior to using any new prescription or non-prescription medications, including herbals, vitamins, non-steroidal anti-inflammatory drugs (NSAIDs) and supplements.  This website has more information on Eliquis (apixaban): http://www.eliquis.com/eliquis/home

## 2022-04-24 ENCOUNTER — Telehealth: Payer: Self-pay

## 2022-04-24 ENCOUNTER — Telehealth (HOSPITAL_COMMUNITY): Payer: Self-pay | Admitting: Pharmacy Technician

## 2022-04-24 ENCOUNTER — Other Ambulatory Visit (HOSPITAL_COMMUNITY): Payer: Self-pay

## 2022-04-24 ENCOUNTER — Inpatient Hospital Stay (HOSPITAL_COMMUNITY): Payer: 59

## 2022-04-24 DIAGNOSIS — D696 Thrombocytopenia, unspecified: Secondary | ICD-10-CM | POA: Diagnosis not present

## 2022-04-24 DIAGNOSIS — Z952 Presence of prosthetic heart valve: Secondary | ICD-10-CM | POA: Diagnosis not present

## 2022-04-24 DIAGNOSIS — R579 Shock, unspecified: Secondary | ICD-10-CM

## 2022-04-24 LAB — COOXEMETRY PANEL
Carboxyhemoglobin: 2 % — ABNORMAL HIGH (ref 0.5–1.5)
Methemoglobin: <0.7 % (ref 0.0–1.5)
O2 Saturation: 53 %
Total hemoglobin: 7.8 g/dL — ABNORMAL LOW (ref 12.0–16.0)

## 2022-04-24 LAB — RESPIRATORY PANEL BY PCR

## 2022-04-24 LAB — CBC
HCT: 20.8 % — ABNORMAL LOW (ref 39.0–52.0)
Hemoglobin: 7.4 g/dL — ABNORMAL LOW (ref 13.0–17.0)
MCH: 32.6 pg (ref 26.0–34.0)
MCHC: 35.6 g/dL (ref 30.0–36.0)
MCV: 91.6 fL (ref 80.0–100.0)
Platelets: 107 10*3/uL — ABNORMAL LOW (ref 150–400)
RBC: 2.27 MIL/uL — ABNORMAL LOW (ref 4.22–5.81)
RDW: 13.3 % (ref 11.5–15.5)
WBC: 15.2 10*3/uL — ABNORMAL HIGH (ref 4.0–10.5)
nRBC: 0 % (ref 0.0–0.2)

## 2022-04-24 LAB — GLUCOSE, CAPILLARY
Glucose-Capillary: 113 mg/dL — ABNORMAL HIGH (ref 70–99)
Glucose-Capillary: 116 mg/dL — ABNORMAL HIGH (ref 70–99)
Glucose-Capillary: 118 mg/dL — ABNORMAL HIGH (ref 70–99)
Glucose-Capillary: 133 mg/dL — ABNORMAL HIGH (ref 70–99)
Glucose-Capillary: 133 mg/dL — ABNORMAL HIGH (ref 70–99)
Glucose-Capillary: 136 mg/dL — ABNORMAL HIGH (ref 70–99)

## 2022-04-24 LAB — RESP PANEL BY RT-PCR (FLU A&B, COVID) ARPGX2
Influenza A by PCR: NEGATIVE
Influenza B by PCR: NEGATIVE
SARS Coronavirus 2 by RT PCR: NEGATIVE

## 2022-04-24 LAB — BASIC METABOLIC PANEL
Anion gap: 4 — ABNORMAL LOW (ref 5–15)
BUN: 29 mg/dL — ABNORMAL HIGH (ref 8–23)
CO2: 27 mmol/L (ref 22–32)
Calcium: 8 mg/dL — ABNORMAL LOW (ref 8.9–10.3)
Chloride: 99 mmol/L (ref 98–111)
Creatinine, Ser: 1.73 mg/dL — ABNORMAL HIGH (ref 0.61–1.24)
GFR, Estimated: 43 mL/min — ABNORMAL LOW (ref >60)
Glucose, Bld: 144 mg/dL — ABNORMAL HIGH (ref 70–99)
Potassium: 3.9 mmol/L (ref 3.5–5.1)
Sodium: 130 mmol/L — ABNORMAL LOW (ref 135–145)

## 2022-04-24 MED ORDER — LORATADINE 10 MG PO TABS
10.0000 mg | ORAL_TABLET | Freq: Every day | ORAL | Status: DC
  Administered 2022-04-24 – 2022-05-04 (×10): 10 mg via ORAL
  Filled 2022-04-24 (×10): qty 1

## 2022-04-24 MED ORDER — ~~LOC~~ CARDIAC SURGERY, PATIENT & FAMILY EDUCATION: Freq: Once | Status: AC

## 2022-04-24 MED ORDER — FUROSEMIDE 10 MG/ML IJ SOLN
40.0000 mg | Freq: Once | INTRAMUSCULAR | Status: AC
  Administered 2022-04-24: 40 mg via INTRAVENOUS
  Filled 2022-04-24: qty 4

## 2022-04-24 MED ORDER — DAPAGLIFLOZIN PROPANEDIOL 10 MG PO TABS
10.0000 mg | ORAL_TABLET | Freq: Every day | ORAL | Status: DC
  Administered 2022-04-24 – 2022-05-04 (×10): 10 mg via ORAL
  Filled 2022-04-24 (×10): qty 1

## 2022-04-24 MED ORDER — SODIUM CHLORIDE 0.9 % IV SOLN: 250.0000 mL | INTRAVENOUS | Status: DC | PRN

## 2022-04-24 MED ORDER — VASOPRESSIN 20 UNITS/100 ML INFUSION FOR SHOCK: 0.0000 [IU]/min | INTRAVENOUS | Status: DC

## 2022-04-24 MED ORDER — SODIUM CHLORIDE 0.9% FLUSH
3.0000 mL | INTRAVENOUS | Status: DC | PRN
  Administered 2022-04-27: 3 mL via INTRAVENOUS

## 2022-04-24 MED ORDER — POTASSIUM CHLORIDE CRYS ER 20 MEQ PO TBCR
20.0000 meq | EXTENDED_RELEASE_TABLET | Freq: Once | ORAL | Status: AC
  Administered 2022-04-24: 20 meq via ORAL
  Filled 2022-04-24: qty 1

## 2022-04-24 MED ORDER — SODIUM CHLORIDE 0.9% FLUSH
3.0000 mL | Freq: Two times a day (BID) | INTRAVENOUS | Status: DC
  Administered 2022-04-24 – 2022-04-28 (×7): 3 mL via INTRAVENOUS

## 2022-04-24 MED ORDER — FLUTICASONE PROPIONATE 50 MCG/ACT NA SUSP
1.0000 | Freq: Every day | NASAL | Status: DC
  Administered 2022-04-24 – 2022-05-04 (×11): 1 via NASAL
  Filled 2022-04-24: qty 16

## 2022-04-24 MED ORDER — FERROUS SULFATE 325 (65 FE) MG PO TABS
325.0000 mg | ORAL_TABLET | Freq: Two times a day (BID) | ORAL | Status: DC
  Administered 2022-04-24 – 2022-05-04 (×20): 325 mg via ORAL
  Filled 2022-04-24 (×20): qty 1

## 2022-04-24 NOTE — Progress Notes (Signed)
Family member positive for covid. In addition to checking RVP, now checking for covid and putting on isolation until results.   Julian Hy, DO 04/24/22 11:11 AM [facility]

## 2022-04-24 NOTE — Progress Notes (Signed)
Patient has his home CPAP, mask and tubing at bedside.

## 2022-04-24 NOTE — Telephone Encounter (Signed)
Pharmacy Patient Advocate Encounter  Insurance verification completed.    The patient is insured through [employer]   The patient is currently admitted and ran test claims for the following: Farxiga, Jardiance.  Copays and coinsurance results were relayed to Inpatient clinical team.

## 2022-04-24 NOTE — Progress Notes (Signed)
[address]       [place] [address]             [phone]      2 Days Post-Op  Procedure(s) (LRB): AORTIC VALVE REPLACEMENT (AVR) USING 25 MM INSPIRIS RESILIA  AORTIC VALVE (N/A) CORONARY ARTERY BYPASS GRAFTING (CABG) TIMES ONE USE THE ENDOSCOPICALLY HARVESTED RIGHT GREATER SAPHENOUS VEIN (N/A) TRANSESOPHAGEAL ECHOCARDIOGRAM (TEE) (N/A)   Total Length of Stay:  LOS: 2 days    SUBJECTIVE: No complaints Less pain with chest tubes out Tolerating diet ambulated Vitals:   04/24/22 0500 04/24/22 0600  BP: 99/69 105/79  Pulse: 88 86  Resp: 13 15  Temp:    SpO2: 96% 93%    Intake/Output      10/12 0701 10/13 0700 10/13 0701 10/14 0700   P.O. 240    I.V. (mL/kg) 533.2 (5.7)    Blood     IV Piggyback 200.1    Total Intake(mL/kg) 973.3 (10.5)    Urine (mL/kg/hr) 875 (0.4)    Blood     Chest Tube 70    Total Output 945    Net +28.3             sodium chloride      ceFAZolin (ANCEF) IV 2 g (04/24/22 0602)   nitroGLYCERIN     vasopressin 0.03 Units/min (04/24/22 0600)    CBC    Component Value Date/Time   WBC 15.2 (H) 04/24/2022 0316   RBC 2.27 (L) 04/24/2022 0316   HGB 7.4 (L) 04/24/2022 0316   HGB 14.2 03/03/2022 0812   HCT 20.8 (L) 04/24/2022 0316   HCT 42.8 03/03/2022 0812   PLT 107 (L) 04/24/2022 0316   PLT 243 03/03/2022 0812   MCV 91.6 04/24/2022 0316   MCV 94 03/03/2022 0812   MCH 32.6 04/24/2022 0316   MCHC 35.6 04/24/2022 0316   RDW 13.3 04/24/2022 0316   RDW 13.2 03/03/2022 0812   LYMPHSABS 1.7 06/19/2020 0934   MONOABS 0.7 06/19/2020 0934   EOSABS 0.1 06/19/2020 0934   BASOSABS 0.0 06/19/2020 0934   CMP     Component Value Date/Time   NA 130 (L) 04/24/2022 0316   NA 141 03/03/2022 0812   K 3.9 04/24/2022 0316   CL 99 04/24/2022 0316   CO2 27 04/24/2022 0316   GLUCOSE 144 (H) 04/24/2022 0316   BUN 29 (H) 04/24/2022 0316   BUN 19 03/03/2022 0812   CREATININE 1.73 (H) 04/24/2022 0316   CALCIUM 8.0 (L)  04/24/2022 0316   PROT 6.9 04/20/2022 1121   PROT 6.7 03/03/2022 0812   ALBUMIN 4.1 04/20/2022 1121   ALBUMIN 4.5 03/03/2022 0812   AST 22 04/20/2022 1121   ALT 17 04/20/2022 1121   ALKPHOS 54 04/20/2022 1121   BILITOT 0.8 04/20/2022 1121   BILITOT 0.7 03/03/2022 0812   GFRNONAA 43 (L) 04/24/2022 0316   GFRAA 61 (L) 03/22/2012 0949   ABG    Component Value Date/Time   PHART 7.365 04/22/2022 2331   PCO2ART 40.7 04/22/2022 2331   PO2ART 147 (H) 04/22/2022 2331   HCO3 23.2 04/22/2022 2331   TCO2 24 04/22/2022 2331   ACIDBASEDEF 2.0 04/22/2022 2331   O2SAT 99 04/22/2022 2331   CBG (last 3)  Recent Labs    04/23/22 1926 04/23/22 2339 04/24/22 0353  GLUCAP 118* 145* 136*  EXAM Lungs: overall clear Card: RR with no murmurs Ext: warm and dry   ASSESSMENT: SP AVR and  CABG  Hemodynamics ok on AAI pacing and Vaso   Will wean vaso for mean above 25mHg CRI   Stable over the past 24 hrs   Will need more diuretics Anemia  Will follow and will start Fe replacement   PCoralie Common MD '@DATE'$ @

## 2022-04-24 NOTE — Progress Notes (Signed)
CT surgery PM rounds  Patient continues to progress after AVR CABG Urine output adequate during the day Creatinine remains at 1.7  Patient waiting for transfer to 4 E.Blood pressure 124/80, pulse 94, temperature 99.5 F (37.5 C), temperature source Esophageal, resp. rate 15, height '5\' 7"'$  (1.702 m), weight 93.1 kg, SpO2 90 %.

## 2022-04-24 NOTE — TOC Benefit Eligibility Note (Signed)
Patient Teacher, English as a foreign language completed.    The patient is currently admitted and upon discharge could be taking Farxiga '10mg'$ .  The current 30 day co-pay is $0.00.   The patient is currently admitted and upon discharge could be taking Jardiance '10mg'$ .  The current 30 day co-pay is $0.00.   The patient is insured through [place], [place] Patient [place] Patient Advocate Team Direct Number: [phone]  Fax: [phone]

## 2022-04-24 NOTE — Progress Notes (Signed)
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  Post operatively the patient remained intubated per plan and was admitted to ICU  Operative course unremarkable. No dysrhythmias. Variable neo requirement.  Cross clamp time 72 min Total pump time 99 min  EBL 1090 Cell saver 620   PCCM consulted in this setting    Pertinent  Medical History  CAD Aortic regurg Brain abscess Seizure disorder PFO s/p repair Dvt PE   Significant Hospital Events: Including procedures, antibiotic start and stop dates in addition to other pertinent events   10/11 CABG and AVR. Remains intubated after case. Anatomy not amenable to swan. PCCM consulted  Interim History / Subjective:  He denies complaints. He walked this morning. Started on vasopressin overnight.   Objective   Blood pressure 107/68, pulse 88, temperature 97.7 F (36.5 C), temperature source Oral, resp. rate (!) 22, height '5\' 7"'$  (1.702 m), weight 93.1 kg, SpO2 96 %.        Intake/Output Summary (Last 24 hours) at 04/24/2022 0852 Last data filed at 04/24/2022 0600 Gross per 24 hour  Intake 923.64 ml  Output 915 ml  Net 8.64 ml    Filed Weights   04/22/22 0708 04/23/22 0439 04/24/22 0500  Weight: 85.3 kg 91.3 kg 93.1 kg    Examination: General: elderly man sitting in the chair in NAD HENT: [place]/AT, eyes anicteric. Rhinorrhea  Lungs: breathing comfortably on [place] Cardiovascular:  S1S2, RRR Abdomen: soft, NT Extremities: no c/c/e Neuro: awake, alert, moving all extremities, normal speech. GU: foley with clear yellow urine  Na+ 130 BUN 29 Cr 1.73 WBC 15.2 H/H  7.4/20.8 Platelets 107 CXR personally  reviewed>  cardiomegaly, RIJ CVC. Pacing wires.  Resolved Hospital Problem list     Assessment & Plan:   Severe aortic regurg s/p AVR CAD s/p CABG x 1 SVG to RCA Baseline EF 45%; assumed heart failure due to valve disease Expected post operative ventilator management Expected post op ABLA  Shock -- multifactorial: cardiogenic, post-bypass vasoplegia, volume shifts  -wires to remain in place per TCTS -hold metoprolol while on pressors -wean off vasopressin today -aspirin, statin -start farxiga -defer transfusions to TCTS -OOB mobility  Hyperglycemia -start farxiga -hold long-acting insulin  -SSI PRN -goal BG 140-180   Hx OSA -in review of last pulm note with Dr. 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    Best Practice (right click and "Reselect all SmartList Selections" daily)   Diet/type: Regular consistency (see orders) DVT prophylaxis: LMWH GI prophylaxis: PPI Lines: Central line Foley:  removal ordered  Code Status:  full code Last date of  multidisciplinary goals of care discussion '[ ]'$   Labs   CBC: Recent Labs  Lab 04/22/22 1339 04/22/22 1340 04/22/22 1817 04/22/22 2202 04/22/22 2331 04/23/22 0305 04/23/22 1624 04/24/22 0316  WBC 20.4*  --  18.2*  --   --  14.0* 17.3* 15.2*  HGB 10.6*   < > 10.3* 8.8* 8.8* 9.1* 7.9* 7.4*  HCT 31.1*   < > 30.0*  26.0* 26.0* 25.4* 23.4* 20.8*  MCV 91.2  --  90.4  --   --  88.8 92.5 91.6  PLT 156  --  169  --   --  145* 121* 107*   < > = values in this interval not displayed.     Basic Metabolic Panel: Recent Labs  Lab 04/20/22 1121 04/22/22 0923 04/22/22 1210 04/22/22 1340 04/22/22 1817 04/22/22 2202 04/22/22 2331 04/23/22 0305 04/23/22 1624 04/24/22 0316  NA 139   < > 137   < > 138 139 140 138 136 130*  K 3.8   < > 5.2*   < > 4.4 4.2 4.2 4.2 4.1 3.9  CL 109   < > 100  --  108  --   --  106 105 99  CO2 23  --   --   --  24  --   --  '23 25 27  '$ GLUCOSE 113*   < > 141*  --  117*  --   --  123* 119* 144*  BUN 17   < > 17  --  15  --   --  16 24* 29*  CREATININE 1.26*   < > 1.00  --  1.11  --   --  1.28* 1.76* 1.73*  CALCIUM 9.2  --   --   --  7.8*  --   --  8.1* 7.4* 8.0*  MG  --   --   --   --  3.1*  --   --  2.4 2.1  --    < > = values in this interval not displayed.    GFR: Estimated Creatinine Clearance: 46.3 mL/min (A) (by C-G formula based on SCr of 1.73 mg/dL (H)). Recent Labs  Lab 04/22/22 1817 04/23/22 0305 04/23/22 1624 04/24/22 0316  WBC 18.2* 14.0* 17.3* 15.2*     This patient is critically ill with multiple organ system failure which requires frequent high complexity decision making, assessment, support, evaluation, and titration of therapies. This was completed through the application of advanced monitoring technologies and extensive interpretation of multiple databases. During this encounter critical care time was devoted to patient care services described in this note for 38 minutes.   Julian Hy, DO 04/24/22 9:17 AM [facility]

## 2022-04-24 NOTE — Telephone Encounter (Signed)
FMLA/STD forms completed and faxed to [email or web address] @ [phone]. Beginning LOA 04/22/22 through 07/20/22.

## 2022-04-25 ENCOUNTER — Inpatient Hospital Stay (HOSPITAL_COMMUNITY): Payer: 59

## 2022-04-25 LAB — CBC
HCT: 22.4 % — ABNORMAL LOW (ref 39.0–52.0)
Hemoglobin: 7.9 g/dL — ABNORMAL LOW (ref 13.0–17.0)
MCH: 32.4 pg (ref 26.0–34.0)
MCHC: 35.3 g/dL (ref 30.0–36.0)
MCV: 91.8 fL (ref 80.0–100.0)
Platelets: 146 10*3/uL — ABNORMAL LOW (ref 150–400)
RBC: 2.44 MIL/uL — ABNORMAL LOW (ref 4.22–5.81)
RDW: 13.2 % (ref 11.5–15.5)
WBC: 15.2 10*3/uL — ABNORMAL HIGH (ref 4.0–10.5)
nRBC: 0 % (ref 0.0–0.2)

## 2022-04-25 LAB — BASIC METABOLIC PANEL
Anion gap: 5 (ref 5–15)
BUN: 26 mg/dL — ABNORMAL HIGH (ref 8–23)
CO2: 27 mmol/L (ref 22–32)
Calcium: 8.7 mg/dL — ABNORMAL LOW (ref 8.9–10.3)
Chloride: 103 mmol/L (ref 98–111)
Creatinine, Ser: 1.48 mg/dL — ABNORMAL HIGH (ref 0.61–1.24)
GFR, Estimated: 52 mL/min — ABNORMAL LOW (ref >60)
Glucose, Bld: 121 mg/dL — ABNORMAL HIGH (ref 70–99)
Potassium: 3.9 mmol/L (ref 3.5–5.1)
Sodium: 135 mmol/L (ref 135–145)

## 2022-04-25 LAB — POCT I-STAT, CHEM 8
BUN: 26 mg/dL — ABNORMAL HIGH (ref 8–23)
Calcium, Ion: 1.24 mmol/L (ref 1.15–1.40)
Chloride: 99 mmol/L (ref 98–111)
Creatinine, Ser: 1.4 mg/dL — ABNORMAL HIGH (ref 0.61–1.24)
Glucose, Bld: 113 mg/dL — ABNORMAL HIGH (ref 70–99)
HCT: 21 % — ABNORMAL LOW (ref 39.0–52.0)
Hemoglobin: 7.1 g/dL — ABNORMAL LOW (ref 13.0–17.0)
Potassium: 4 mmol/L (ref 3.5–5.1)
Sodium: 136 mmol/L (ref 135–145)
TCO2: 29 mmol/L (ref 22–32)

## 2022-04-25 LAB — MAGNESIUM: Magnesium: 2.5 mg/dL — ABNORMAL HIGH (ref 1.7–2.4)

## 2022-04-25 LAB — GLUCOSE, CAPILLARY
Glucose-Capillary: 131 mg/dL — ABNORMAL HIGH (ref 70–99)
Glucose-Capillary: 98 mg/dL (ref 70–99)

## 2022-04-25 MED ORDER — FUROSEMIDE 40 MG PO TABS
40.0000 mg | ORAL_TABLET | Freq: Every day | ORAL | Status: DC
  Administered 2022-04-25 – 2022-04-28 (×4): 40 mg via ORAL
  Filled 2022-04-25 (×5): qty 1

## 2022-04-25 MED ORDER — METOPROLOL TARTRATE 25 MG PO TABS: 25.0000 mg | ORAL_TABLET | Freq: Two times a day (BID) | ORAL | Status: DC

## 2022-04-25 MED ORDER — DILTIAZEM HCL-DEXTROSE 125-5 MG/125ML-% IV SOLN (PREMIX)
5.0000 mg/h | INTRAVENOUS | Status: DC
  Administered 2022-04-25: 5 mg/h via INTRAVENOUS
  Administered 2022-04-26 (×2): 15 mg/h via INTRAVENOUS
  Administered 2022-04-26: 10 mg/h via INTRAVENOUS
  Administered 2022-04-27: 9.5 mg/h via INTRAVENOUS
  Administered 2022-04-27: 15 mg/h via INTRAVENOUS
  Filled 2022-04-25 (×7): qty 125

## 2022-04-25 MED ORDER — METOPROLOL TARTRATE 12.5 MG HALF TABLET
12.5000 mg | ORAL_TABLET | Freq: Two times a day (BID) | ORAL | Status: DC
  Administered 2022-04-25: 12.5 mg via ORAL
  Filled 2022-04-25 (×2): qty 1

## 2022-04-25 MED ORDER — POTASSIUM CHLORIDE CRYS ER 20 MEQ PO TBCR
20.0000 meq | EXTENDED_RELEASE_TABLET | Freq: Two times a day (BID) | ORAL | Status: DC
  Administered 2022-04-25 (×2): 20 meq via ORAL
  Filled 2022-04-25 (×3): qty 1

## 2022-04-25 NOTE — Progress Notes (Signed)
CT surgery PM rounds  Patient comfortable, ambulatory Remains in rate controlled atrial fibrillation on IV Cardizem slowly increasing dose Diuresing well  Blood pressure 100/72, pulse (!) 111, temperature 97.7 F (36.5 C), temperature source Oral, resp. rate 18, height '5\' 7"'$  (1.702 m), weight 91 kg, SpO2 (!) 84 %.

## 2022-04-25 NOTE — Progress Notes (Signed)
3 Days Post-Op Procedure(s) (LRB): AORTIC VALVE REPLACEMENT (AVR) USING 25 MM INSPIRIS RESILIA  AORTIC VALVE (N/A) CORONARY ARTERY BYPASS GRAFTING (CABG) TIMES ONE USE THE ENDOSCOPICALLY HARVESTED RIGHT GREATER SAPHENOUS VEIN (N/A) TRANSESOPHAGEAL ECHOCARDIOGRAM (TEE) (N/A) Subjective: Patient developed atrial fibrillation overnight with heart rate 100-110 however he remains asymptomatic. Because of anaphylactic reaction to IV contrast with cross reaction to amiodarone pharmacy recommends against its use for A-fib.  will try to maximize beta-blocker and add IV Cardizem for chemical cardioversion. Chest x-ray is clear, weight up 12 pounds, continue oral Lasix and potassium Objective: Vital signs in last 24 hours: Temp:  [97.8 F (36.6 C)-99.5 F (37.5 C)] 97.8 F (36.6 C) (10/14 1121) Pulse Rate:  [76-133] 124 (10/14 1100) Cardiac Rhythm: Atrial fibrillation (10/14 0800) Resp:  [10-30] 17 (10/14 1100) BP: (82-126)/(57-88) 92/77 (10/14 1100) SpO2:  [86 %-100 %] 92 % (10/14 1100) Weight:  [91 kg] 91 kg (10/14 0332)  Hemodynamic parameters for last 24 hours:    Intake/Output from previous day: 10/13 0701 - 10/14 0700 In: 989.2 [P.O.:720; I.V.:59.1; IV Piggyback:200.1] Out: 3250 [Urine:3250] Intake/Output this shift: Total I/O In: 240 [P.O.:240] Out: 475 [Urine:475]  Exam Alert and appropriate Sternal incision clean and dry Clear Mild peripheral edema [place] soft No cardiac murmur  Lab Results: Recent Labs    04/24/22 0316 04/25/22 0301 04/25/22 0302  WBC 15.2*  --  15.2*  HGB 7.4* 7.1* 7.9*  HCT 20.8* 21.0* 22.4*  PLT 107*  --  146*   BMET:  Recent Labs    04/24/22 0316 04/25/22 0301 04/25/22 0302  NA 130* 136 135  K 3.9 4.0 3.9  CL 99 99 103  CO2 27  --  27  GLUCOSE 144* 113* 121*  BUN 29* 26* 26*  CREATININE 1.73* 1.40* 1.48*  CALCIUM 8.0*  --  8.7*    PT/INR:  Recent Labs    04/22/22 1339  LABPROT 20.3*  INR 1.8*   ABG    Component Value  Date/Time   PHART 7.365 04/22/2022 2331   HCO3 23.2 04/22/2022 2331   TCO2 29 04/25/2022 0301   ACIDBASEDEF 2.0 04/22/2022 2331   O2SAT 53 04/24/2022 1000   CBG (last 3)  Recent Labs    04/24/22 2332 04/25/22 0458 04/25/22 1114  GLUCAP 133* 131* 98    Assessment/Plan: S/P Procedure(s) (LRB): AORTIC VALVE REPLACEMENT (AVR) USING 25 MM INSPIRIS RESILIA  AORTIC VALVE (N/A) CORONARY ARTERY BYPASS GRAFTING (CABG) TIMES ONE USE THE ENDOSCOPICALLY HARVESTED RIGHT GREATER SAPHENOUS VEIN (N/A) TRANSESOPHAGEAL ECHOCARDIOGRAM (TEE) (N/A) Postoperative atrial fibrillation fairly well tolerated and controlled-IV Cardizem started Postoperative anemia on oral iron Postoperative fluid retention, continue oral Lasix Keep in ICU today  LOS: 3 days    Dahlia Byes 04/25/2022

## 2022-04-25 NOTE — Progress Notes (Addendum)
eLink Physician-Brief Progress Note Patient Name: [name] DOB: [birth year] MRN: [id number]   Date of Service  04/25/2022  HPI/Events of Note  Atrial fibrillation persists with RVR (110).  Patient has an allergy to iodine / iodinated contrast and the order system is flagging Amiodarone for that reason.  eICU Interventions  Will defer to cardiac surgeon whether they would like to rate control with Cardizem  / Lopressor vs trial  Amiodarone despite the flag.     Intervention Category Major Interventions: Arrhythmia - evaluation and management  Frederik Pear 04/25/2022, 3:56 AM

## 2022-04-25 NOTE — Progress Notes (Signed)
eLink Physician-Brief Progress Note Patient Name: [name] DOB: 07-Sep-1955 MRN: [id number]   Date of Service  04/25/2022  HPI/Events of Note  Patient went into atrial fibrillation with RVR, bedside RN gave 5 mg of Lopressor iv, Qtc 438. Heart rate currently 90's to 115.  eICU Interventions  Will monitor for spontaneous conversion and consider Amiodarone 150 mg iv x 1 if patient fails to spontaneously convert within the next hour.        [name] [name] 04/25/2022, 1:24 AM

## 2022-04-26 ENCOUNTER — Inpatient Hospital Stay (HOSPITAL_COMMUNITY): Payer: 59

## 2022-04-26 LAB — BASIC METABOLIC PANEL
Anion gap: 6 (ref 5–15)
BUN: 25 mg/dL — ABNORMAL HIGH (ref 8–23)
CO2: 27 mmol/L (ref 22–32)
Calcium: 8.5 mg/dL — ABNORMAL LOW (ref 8.9–10.3)
Chloride: 104 mmol/L (ref 98–111)
Creatinine, Ser: 1.41 mg/dL — ABNORMAL HIGH (ref 0.61–1.24)
GFR, Estimated: 55 mL/min — ABNORMAL LOW (ref >60)
Glucose, Bld: 122 mg/dL — ABNORMAL HIGH (ref 70–99)
Potassium: 3.5 mmol/L (ref 3.5–5.1)
Sodium: 137 mmol/L (ref 135–145)

## 2022-04-26 LAB — CBC
HCT: 22.2 % — ABNORMAL LOW (ref 39.0–52.0)
Hemoglobin: 7.8 g/dL — ABNORMAL LOW (ref 13.0–17.0)
MCH: 31.8 pg (ref 26.0–34.0)
MCHC: 35.1 g/dL (ref 30.0–36.0)
MCV: 90.6 fL (ref 80.0–100.0)
Platelets: 206 10*3/uL (ref 150–400)
RBC: 2.45 MIL/uL — ABNORMAL LOW (ref 4.22–5.81)
RDW: 13.4 % (ref 11.5–15.5)
WBC: 12.9 10*3/uL — ABNORMAL HIGH (ref 4.0–10.5)
nRBC: 0 % (ref 0.0–0.2)

## 2022-04-26 LAB — GLUCOSE, CAPILLARY: Glucose-Capillary: 114 mg/dL — ABNORMAL HIGH (ref 70–99)

## 2022-04-26 LAB — SARS CORONAVIRUS 2 BY RT PCR: SARS Coronavirus 2 by RT PCR: NEGATIVE

## 2022-04-26 MED ORDER — POTASSIUM CHLORIDE CRYS ER 20 MEQ PO TBCR
40.0000 meq | EXTENDED_RELEASE_TABLET | Freq: Two times a day (BID) | ORAL | Status: DC
  Administered 2022-04-26 – 2022-05-03 (×15): 40 meq via ORAL
  Filled 2022-04-26 (×15): qty 2

## 2022-04-26 MED ORDER — TAMSULOSIN HCL 0.4 MG PO CAPS
0.4000 mg | ORAL_CAPSULE | Freq: Every day | ORAL | Status: DC
  Administered 2022-04-26 – 2022-05-04 (×9): 0.4 mg via ORAL
  Filled 2022-04-26 (×9): qty 1

## 2022-04-26 MED ORDER — POTASSIUM CHLORIDE 10 MEQ/50ML IV SOLN: 10.0000 meq | INTRAVENOUS | Status: DC

## 2022-04-26 MED ORDER — HYDROCOD POLI-CHLORPHE POLI ER 10-8 MG/5ML PO SUER
5.0000 mL | Freq: Two times a day (BID) | ORAL | Status: DC
  Administered 2022-04-26 – 2022-04-27 (×4): 5 mL via ORAL
  Filled 2022-04-26 (×4): qty 5

## 2022-04-26 MED ORDER — SORBITOL 70 % SOLN
30.0000 mL | Freq: Once | Status: AC
  Administered 2022-04-26: 30 mL via ORAL
  Filled 2022-04-26: qty 30

## 2022-04-26 MED ORDER — METOPROLOL TARTRATE 25 MG PO TABS
25.0000 mg | ORAL_TABLET | Freq: Two times a day (BID) | ORAL | Status: DC
  Administered 2022-04-26 (×2): 25 mg via ORAL
  Filled 2022-04-26 (×2): qty 1

## 2022-04-26 MED ORDER — POLYETHYLENE GLYCOL 3350 17 G PO PACK
17.0000 g | PACK | Freq: Every day | ORAL | Status: DC
  Administered 2022-04-26 – 2022-04-28 (×3): 17 g via ORAL
  Filled 2022-04-26 (×5): qty 1

## 2022-04-26 NOTE — Progress Notes (Signed)
CT surgery PM rounds  Remains in rate controlled atrial fibrillation COVID retest remains negative Diuresing well Foley catheter replaced because of recurrent retention Walked in the room today but not in the hallway due to increased heart rate with activity  Blood pressure 112/63, pulse 66, temperature 98.6 F (37 C), temperature source Oral, resp. rate 19, height '5\' 7"'$  (1.702 m), weight 90.7 kg, SpO2 91 %.

## 2022-04-26 NOTE — Progress Notes (Signed)
4 Days Post-Op Procedure(s) (LRB): AORTIC VALVE REPLACEMENT (AVR) USING 25 MM INSPIRIS RESILIA  AORTIC VALVE (N/A) CORONARY ARTERY BYPASS GRAFTING (CABG) TIMES ONE USE THE ENDOSCOPICALLY HARVESTED RIGHT GREATER SAPHENOUS VEIN (N/A) TRANSESOPHAGEAL ECHOCARDIOGRAM (TEE) (N/A) Subjective: Congested with cough concerned he has COVID because of positive family member.  Afebrile normal white count and chest x-ray today is clear  In and out of sinus rhythm and atrial fibrillation-allergic to amiodarone.  Will increase Cardizem drip and increase oral dose of metoprolol now that blood pressure is improved   Objective: Vital signs in last 24 hours: Temp:  [97.7 F (36.5 C)-98.8 F (37.1 C)] 97.7 F (36.5 C) (10/15 0819) Pulse Rate:  [59-141] 83 (10/15 0645) Cardiac Rhythm: Atrial fibrillation (10/15 0800) Resp:  [11-38] 24 (10/15 0800) BP: (80-123)/(47-110) 97/74 (10/15 0645) SpO2:  [67 %-99 %] 97 % (10/15 0645) Weight:  [90.7 kg] 90.7 kg (10/15 0445)  Hemodynamic parameters for last 24 hours:    Intake/Output from previous day: 10/14 0701 - 10/15 0700 In: 644.1 [P.O.:480; I.V.:164.1] Out: 2500 [Urine:2500] Intake/Output this shift: Total I/O In: 10 [I.V.:10] Out: 100 [Urine:100] Exam Lungs clear Rate controlled atrial fibrillation Abdomen soft No cardiac murmur   Lab Results: Recent Labs    04/25/22 0302 04/26/22 0847  WBC 15.2* 12.9*  HGB 7.9* 7.8*  HCT 22.4* 22.2*  PLT 146* 206   BMET:  Recent Labs    04/25/22 0302 04/26/22 0847  NA 135 137  K 3.9 3.5  CL 103 104  CO2 27 27  GLUCOSE 121* 122*  BUN 26* 25*  CREATININE 1.48* 1.41*  CALCIUM 8.7* 8.5*    PT/INR: No results for input(s): "LABPROT", "INR" in the last 72 hours. ABG    Component Value Date/Time   PHART 7.365 04/22/2022 2331   HCO3 23.2 04/22/2022 2331   TCO2 29 04/25/2022 0301   ACIDBASEDEF 2.0 04/22/2022 2331   O2SAT 53 04/24/2022 1000   CBG (last 3)  Recent Labs    04/24/22 2332  04/25/22 0458 04/25/22 1114  GLUCAP 133* 131* 98    Assessment/Plan: S/P Procedure(s) (LRB): AORTIC VALVE REPLACEMENT (AVR) USING 25 MM INSPIRIS RESILIA  AORTIC VALVE (N/A) CORONARY ARTERY BYPASS GRAFTING (CABG) TIMES ONE USE THE ENDOSCOPICALLY HARVESTED RIGHT GREATER SAPHENOUS VEIN (N/A) TRANSESOPHAGEAL ECHOCARDIOGRAM (TEE) (N/A) Retest for COVID-last PCR was negative Increase IV Cardizem and oral metoprolol for chemical cardioversion of atrial fibrillation Supplement potassium for 3.6  LOS: 4 days    Dahlia Byes 04/26/2022

## 2022-04-27 ENCOUNTER — Other Ambulatory Visit (HOSPITAL_COMMUNITY): Payer: Self-pay

## 2022-04-27 ENCOUNTER — Telehealth (HOSPITAL_COMMUNITY): Payer: Self-pay | Admitting: Pharmacy Technician

## 2022-04-27 DIAGNOSIS — R579 Shock, unspecified: Secondary | ICD-10-CM | POA: Diagnosis not present

## 2022-04-27 DIAGNOSIS — I4891 Unspecified atrial fibrillation: Secondary | ICD-10-CM

## 2022-04-27 LAB — CBC
HCT: 22.6 % — ABNORMAL LOW (ref 39.0–52.0)
Hemoglobin: 7.6 g/dL — ABNORMAL LOW (ref 13.0–17.0)
MCH: 31.3 pg (ref 26.0–34.0)
MCHC: 33.6 g/dL (ref 30.0–36.0)
MCV: 93 fL (ref 80.0–100.0)
Platelets: 234 10*3/uL (ref 150–400)
RBC: 2.43 MIL/uL — ABNORMAL LOW (ref 4.22–5.81)
RDW: 13.7 % (ref 11.5–15.5)
WBC: 10 10*3/uL (ref 4.0–10.5)
nRBC: 0 % (ref 0.0–0.2)

## 2022-04-27 LAB — BASIC METABOLIC PANEL
Anion gap: 7 (ref 5–15)
BUN: 16 mg/dL (ref 8–23)
CO2: 26 mmol/L (ref 22–32)
Calcium: 8.4 mg/dL — ABNORMAL LOW (ref 8.9–10.3)
Chloride: 106 mmol/L (ref 98–111)
Creatinine, Ser: 1.1 mg/dL (ref 0.61–1.24)
GFR, Estimated: >60 mL/min (ref >60)
Glucose, Bld: 117 mg/dL — ABNORMAL HIGH (ref 70–99)
Potassium: 3.5 mmol/L (ref 3.5–5.1)
Sodium: 139 mmol/L (ref 135–145)

## 2022-04-27 LAB — MAGNESIUM: Magnesium: 2 mg/dL (ref 1.7–2.4)

## 2022-04-27 LAB — TSH: TSH: 7.44 u[IU]/mL — ABNORMAL HIGH (ref 0.350–4.500)

## 2022-04-27 LAB — T4, FREE: Free T4: 0.92 ng/dL (ref 0.61–1.12)

## 2022-04-27 MED ORDER — METOPROLOL TARTRATE 50 MG PO TABS
50.0000 mg | ORAL_TABLET | Freq: Three times a day (TID) | ORAL | Status: DC
  Administered 2022-04-27 – 2022-05-02 (×15): 50 mg via ORAL
  Filled 2022-04-27 (×16): qty 1

## 2022-04-27 MED ORDER — GUAIFENESIN-DM 100-10 MG/5ML PO SYRP
10.0000 mL | ORAL_SOLUTION | ORAL | Status: DC | PRN
  Administered 2022-04-27 – 2022-04-29 (×4): 10 mL via ORAL
  Filled 2022-04-27 (×4): qty 10

## 2022-04-27 MED ORDER — APIXABAN 5 MG PO TABS
5.0000 mg | ORAL_TABLET | Freq: Two times a day (BID) | ORAL | Status: DC
  Administered 2022-04-27 – 2022-05-04 (×15): 5 mg via ORAL
  Filled 2022-04-27 (×15): qty 1

## 2022-04-27 MED ORDER — METOPROLOL TARTRATE 25 MG PO TABS
25.0000 mg | ORAL_TABLET | Freq: Three times a day (TID) | ORAL | Status: DC
  Administered 2022-04-27 (×2): 25 mg via ORAL
  Filled 2022-04-27 (×2): qty 1

## 2022-04-27 MED ORDER — ASPIRIN 81 MG PO TBEC
81.0000 mg | DELAYED_RELEASE_TABLET | Freq: Every day | ORAL | Status: DC
  Administered 2022-04-27 – 2022-05-04 (×8): 81 mg via ORAL
  Filled 2022-04-27 (×8): qty 1

## 2022-04-27 MED ORDER — DILTIAZEM HCL 60 MG PO TABS
90.0000 mg | ORAL_TABLET | Freq: Four times a day (QID) | ORAL | Status: DC
  Administered 2022-04-27 – 2022-05-04 (×29): 90 mg via ORAL
  Filled 2022-04-27 (×29): qty 2

## 2022-04-27 NOTE — Progress Notes (Signed)
1930 patient alert x4 able to make all needs know 2000 patient request cough medication 2130 all medications given as ordered patient refusing bath says he was up in the chair for 3 hours today and it was to much for him so he doesn't want to wash up tonight that he will do it in the morning patient educated that I leave at 7 am so it would be early patient agreeable at this time.  Hr in 87s and SBP 80s cardizem gtt stopped per orders 04/28/22 0230 patient up to commode then bath competed after. Patient encouraged to turn off TV and turn off lights to get some rest. 0400 patient again encouraged to sleep. 0600 patient ambulated in hall went half the unit tolerated well HR didn't go over 94 patient stopped 3 times because he felt SOB resp 30s. Patient didn't sleep much at all last night maybe 1 hour total though out there night if that

## 2022-04-27 NOTE — TOC Benefit Eligibility Note (Signed)
Patient Teacher, English as a foreign language completed.    The patient is currently admitted and upon discharge could be taking Eliquis 5 mg.  The current 30 day co-pay is $0.00.   The patient is insured through [place], [place] Patient [place] Patient Advocate Team Direct Number: [phone]  Fax: [phone]

## 2022-04-27 NOTE — Consult Note (Addendum)
ELECTROPHYSIOLOGY CONSULT NOTE    Patient ID: [name] MRN: [id number], DOB/AGE: 16-Dec-1955 66 y.o.  Admit date: 04/22/2022 Date of Consult: 04/27/2022  Primary Physician: Cari Caraway, MD Primary Cardiologist: Dr. Einar Gip  Electrophysiologist: New   Referring Provider: Dr. Lavonna Monarch  Patient Profile: [name] is a 66 y.o. 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 Lavonna Monarch.  HPI:  [name] is a 66 y.o. male with complicated medical history as above.   Echo 01/2022 showed Mod to sev AR and EF 46% and referred to CT surgery by Dr. Einar Gip.   [facility] 02/2022 showed mild pulmonary hypertension secondary to elevated EDP, single-vessel coronary artery disease that is of significance involving the right coronary artery which is moderate-sized vessel.  Mild disease is evident in the LAD and CX.  Pt presented 10/11 for planned AV replacement and possible CABG.   Pt underwent:  AORTIC VALVE REPLACEMENT (AVR) USING 25 MM INSPIRIS RESILIA  AORTIC VALVE (N/A) CORONARY ARTERY BYPASS GRAFTING (CABG) TIMES ONE USE THE ENDOSCOPICALLY HARVESTED RIGHT GREATER SAPHENOUS VEIN (N/A)  Pt had unremarkable post op course up until 10/14, where he developed AF with RVR. Pt has had anaphylactic reaction to Iodinated Contrast Media and pharmacy recommended against use of amiodarone to control it.    He continued to have AF with variable rates despite diltiazem gtt and intermittent lopressor boluses, and EP was asked to see for further recommendations.   Potassium3.5 (10/16 0301) Magnesium  2.5* (10/14 0302) Creatinine, ser  1.10 (10/16 0301) PLT  234 (10/16 0301) HGB  7.6* (10/16 0301) WBC 10.0 (10/16 0301)  He is feeling OK this am. Not moving around very  much with HRs up into 130-150s with activity over the weekend. He was not particularly symptomatic with fast heart rates, denying any increase in SOB from post op or tachy palpitations. No edema.   Past Medical History:  Diagnosis Date   ADHD    Anginal pain ([place])    Anxiety    Arthritis    Asthma    excerise induced   Brain abscess    Chronic renal insufficiency, stage III (moderate) (HCC)    Coronary artery disease    DVT (deep venous thrombosis) (HCC)    Esophageal reflux    History of osteomyelitis    History of transfusion of platelets    Hx of pulmonary embolus    x 2   Hyperlipidemia    Hypertension    Hypothyroidism    Neuropathy    Patent foramen ovale    Seizures (HCC)    Sleep apnea    Sleep study done 5-6  years ago machine set at 10     Surgical History:  Past Surgical History:  Procedure Laterality Date   ANTERIOR CRUCIATE LIGAMENT REPAIR     AORTIC VALVE REPLACEMENT N/A 04/22/2022   Procedure: AORTIC VALVE REPLACEMENT (AVR) USING 25 MM INSPIRIS RESILIA  AORTIC VALVE;  Surgeon: Coralie Common, MD;  Location: [place];  Service: Open Heart Surgery;  Laterality: N/A;   BRAIN SURGERY     abscess drainage x 2   CARPAL TUNNEL RELEASE     CORONARY ARTERY BYPASS GRAFT N/A 04/22/2022   Procedure: CORONARY ARTERY BYPASS GRAFTING (CABG) TIMES  ONE USE THE ENDOSCOPICALLY HARVESTED RIGHT GREATER SAPHENOUS VEIN;  Surgeon: Coralie Common, MD;  Location: [place];  Service: Open Heart Surgery;  Laterality: N/A;  Open midline sternotomy   CRANIOTOMY     CRANIOTOMY  03/30/2012   Procedure: CRANIOTOMY TUMOR EXCISION;  Surgeon: Winfield Cunas, MD;  Location: [place] [facility];  Service: Neurosurgery;  Laterality: Right;   craniotomy for intracranial mass.   CRANIOTOMY  03/31/2012   Procedure: CRANIOTOMY TUMOR EXCISION;  Surgeon: Winfield Cunas, MD;  Location: [place] [facility];  Service: Neurosurgery;  Laterality: Right;  Craniotomy for excision of mass   Deep Vein     FRACTURE SURGERY     right  arm   LAPAROSCOPIC APPENDECTOMY  11/20/2011   LAPAROSCOPIC APPENDECTOMY  11/20/2011   Procedure: APPENDECTOMY LAPAROSCOPIC;  Surgeon: Harl Bowie, MD;  Location: [facility];  Service: General;  Laterality: N/A;   PATENT FORAMEN OVALE CLOSURE     Pulmonary Embolus     RIGHT/LEFT HEART CATH AND CORONARY ANGIOGRAPHY N/A 03/10/2022   Procedure: RIGHT/LEFT HEART CATH AND CORONARY ANGIOGRAPHY;  Surgeon: Adrian Prows, MD;  Location: [place] [facility];  Service: Cardiovascular;  Laterality: N/A;   TEE WITHOUT CARDIOVERSION N/A 04/22/2022   Procedure: TRANSESOPHAGEAL ECHOCARDIOGRAM (TEE);  Surgeon: Coralie Common, MD;  Location: [place];  Service: Open Heart Surgery;  Laterality: N/A;   ULNAR TUNNEL RELEASE       Medications Prior to Admission  Medication Sig Dispense Refill Last Dose   azelastine (ASTELIN) 0.1 % nasal spray Place 1 spray into both nostrils 2 (two) times daily as needed for allergies.   04/22/2022 at 0530   escitalopram (LEXAPRO) 10 MG tablet Take 10 mg by mouth daily.   04/22/2022 at 0530   fluticasone-salmeterol (ADVAIR HFA) 230-21 MCG/ACT inhaler Inhale 2 puffs into the lungs 2 (two) times daily.   04/22/2022 at 0530   irbesartan (AVAPRO) 150 MG tablet Take 150 mg by mouth daily.   04/22/2022 at 0530   levETIRAcetam (KEPPRA) 500 MG tablet Take 500 mg by mouth 2 (two) times daily.    04/22/2022 at 0530   levothyroxine (SYNTHROID) 50 MCG tablet TAKE 1 TABLET BY MOUTH  DAILY (Patient taking differently: Take 50-100 mcg by mouth See admin instructions. 50 mcg daily in the morning, alternating with 100 mcg every other day) 90 tablet 3 04/22/2022 at 0530   pantoprazole (PROTONIX) 40 MG tablet Take 40 mg by mouth every other day.   04/22/2022 at 0530   rosuvastatin (CRESTOR) 10 MG tablet TAKE 1 TABLET BY MOUTH  DAILY 90 tablet 3 04/22/2022 at 0530   albuterol (PROVENTIL HFA;VENTOLIN HFA) 108 (90 BASE) MCG/ACT inhaler Inhale 2 puffs into the lungs every 6 (six) hours as needed for wheezing or  shortness of breath.   More than a month    Inpatient Medications:   acetaminophen  1,000 mg Oral Q6H   Or   acetaminophen (TYLENOL) oral liquid 160 mg/5 mL  1,000 mg Per Tube Q6H   apixaban  5 mg Oral BID   bisacodyl  10 mg Oral Daily   Or   bisacodyl  10 mg Rectal Daily   Chlorhexidine Gluconate Cloth  6 each Topical Daily   chlorpheniramine-HYDROcodone  5 mL Oral Q12H   dapagliflozin propanediol  10 mg Oral Daily   docusate sodium  200 mg Oral Daily   escitalopram  10 mg Oral Daily   ferrous sulfate  325 mg Oral BID PC   fluticasone  1  spray Each Nare Daily   furosemide  40 mg Oral Daily   levETIRAcetam  500 mg Oral BID   levothyroxine  100 mcg Oral Q T,Th,S,Su-1800   levothyroxine  50 mcg Oral Q M,W,F   loratadine  10 mg Oral Daily   metoprolol tartrate  25 mg Oral TID   pantoprazole  40 mg Oral Daily   polyethylene glycol  17 g Oral Daily   potassium chloride  40 mEq Oral BID   rosuvastatin  10 mg Oral Daily   sodium chloride flush  3 mL Intravenous Q12H   sodium chloride flush  3 mL Intravenous Q12H   tamsulosin  0.4 mg Oral Daily    Allergies:  Allergies  Allergen Reactions   Iodinated Contrast Media Anaphylaxis   Iohexol Anaphylaxis and Shortness Of Breath     Code: HIVES, Desc: hives and tachycardia last time pt received IV CM kdean 07/19/06, Onset Date: [id number]   On 9/9 pt had a ct head w/ and received 13hr premedication. Was fine after scan, but broke out in hives when he got home.  Took '100mg'$  benedryl at home PO, and was fine.    [place] Thyroid [Thyroid]     mouth swelling   Codeine     agitation   Dilantin [Phenytoin] Other (See Comments)    Destroyed Blood platelets    Heparin Hives    On going therapy    Hydrocodone     Agitation   Nuvigil [Armodafinil]     rash   Other     Follows Kosher Diet; Needs Kosher products   Shellfish Allergy Other (See Comments)    Mouth tingles   Flagyl [Metronidazole] Rash   Rocephin [Ceftriaxone] Rash    Social  History   Socioeconomic History   Marital status: Married    Spouse name: Not on file   Number of children: 5   Years of education: college   Highest education level: Not on file  Occupational History    Employer: [employer]  Tobacco Use   Smoking status: Never   Smokeless tobacco: Never  Vaping Use   Vaping Use: Never used  Substance and Sexual Activity   Alcohol use: No    Comment: quit 20 years ago   Drug use: No   Sexual activity: Never  Other Topics Concern   Not on file  Social History Narrative   Lives with  Wife   Caffeine    Social Determinants of Health   Financial Resource Strain: Not on file  Food Insecurity: Not on file  Transportation Needs: Not on file  Physical Activity: Not on file  Stress: Not on file  Social Connections: Not on file  Intimate Partner Violence: Not on file     Family History  Problem Relation Age of Onset   Bladder Cancer Mother    Stroke Father    Thyroid disease Sister    Diabetes Brother    Allergies Other        whole family   Asthma Other        whole family     Review of Systems: All other systems reviewed and are otherwise negative except as noted above.  Physical Exam: Vitals:   04/27/22 0200 04/27/22 0300 04/27/22 0400 04/27/22 0500  BP: 119/66 111/68 107/77 105/72  Pulse:      Resp: (!) '23 20 17 '$ (!) 22  Temp:  98.2 F (36.8 C)    TempSrc:  Oral    SpO2:  93%   Weight:    90.2 kg  Height:        GEN- The patient is well appearing, alert and oriented x 3 today.   HEENT: normocephalic, atraumatic; sclera clear, conjunctiva pink; hearing intact; oropharynx clear; neck supple Lungs- Clear to ausculation bilaterally, normal work of breathing.  No wheezes, rales, rhonchi Heart- Irregular irregular rate and rhythm, no murmurs, rubs or gallops GI- soft, non-tender, non-distended, bowel sounds present Extremities- no clubbing, cyanosis, or edema; DP/PT/radial pulses 2+ bilaterally MS- no significant  deformity or atrophy Skin- warm and dry, no rash or lesion Psych- euthymic mood, full affect Neuro- strength and sensation are intact  Labs:   Lab Results  Component Value Date   WBC 10.0 04/27/2022   HGB 7.6 (L) 04/27/2022   HCT 22.6 (L) 04/27/2022   MCV 93.0 04/27/2022   PLT 234 04/27/2022    Recent Labs  Lab 04/20/22 1121 04/22/22 0923 04/27/22 0301  NA 139   < > 139  K 3.8   < > 3.5  CL 109   < > 106  CO2 23   < > 26  BUN 17   < > 16  CREATININE 1.26*   < > 1.10  CALCIUM 9.2   < > 8.4*  PROT 6.9  --   --   BILITOT 0.8  --   --   ALKPHOS 54  --   --   ALT 17  --   --   AST 22  --   --   GLUCOSE 113*   < > 117*   < > = values in this interval not displayed.      Radiology/Studies: DG Chest Port 1 View  Result Date: 04/26/2022 CLINICAL DATA:  Status post aortic valve replacement EXAM: PORTABLE CHEST 1 VIEW COMPARISON:  Prior chest x-ray 04/25/2022 FINDINGS: Patient is status post median sternotomy with evidence of prior CABG and aortic valve replacement. Cardiac and mediastinal contours are unchanged. Mild cardiomegaly. No overt pulmonary edema or pneumothorax. Improved left basilar atelectasis. Small left pleural effusion persists. IMPRESSION: 1. Improved lung volumes with decreased atelectasis. 2. Small left pleural effusion. 3. Stable cardiomegaly. Electronically Signed   By: Jacqulynn Cadet M.D.   On: 04/26/2022 07:31   DG Chest Port 1 View  Result Date: 04/25/2022 CLINICAL DATA:  Status post aortic valve replacement. EXAM: PORTABLE CHEST 1 VIEW COMPARISON:  04/24/2022 FINDINGS: 0606 hours. The cardio pericardial silhouette is enlarged. Interval improvement in aeration at the left base with persistent bibasilar subsegmental atelectasis. No pulmonary edema or substantial pleural effusion. Telemetry leads overlie the chest. IMPRESSION: Interval improvement in aeration at the left base with persistent bibasilar subsegmental atelectasis. Electronically Signed   By:  Misty Stanley M.D.   On: 04/25/2022 09:08   DG CHEST PORT 1 VIEW  Result Date: 04/24/2022 CLINICAL DATA:  Status post aortic valve replacement. EXAM: PORTABLE CHEST 1 VIEW COMPARISON:  04/23/2022 FINDINGS: Chest drains have been removed. Right jugular central line is stable. Low lung volumes without overt pulmonary edema or significant airspace disease. There may be mild atelectasis at the medial left lung base. Negative for a pneumothorax. Postoperative changes compatible with a CABG and aortic valve replacement. IMPRESSION: 1. Removal of chest drains without a pneumothorax. 2. Low lung volumes. Electronically Signed   By: Markus Daft M.D.   On: 04/24/2022 08:33   DG Chest Port 1 View  Result Date: 04/23/2022 CLINICAL DATA:  Chest tube present, status post CABG EXAM: PORTABLE  CHEST 1 VIEW COMPARISON:  04/22/2022 FINDINGS: Interval endotracheal and esophagogastric extubation. Removal of left-sided chest tube. Right neck vascular sheath and mediastinal drainage tubes remain in position. Gross cardiomegaly status post median sternotomy and CABG. Small left pleural effusion, unchanged. No acute abnormality of the lungs. Osseous structures unremarkable. IMPRESSION: 1. Interval endotracheal and esophagogastric extubation. Removal of left-sided chest tube. Right neck vascular sheath and mediastinal drainage tubes remain in position. 2. Gross cardiomegaly status post median sternotomy and CABG. 3. Small left pleural effusion, unchanged. Electronically Signed   By: Delanna Ahmadi M.D.   On: 04/23/2022 08:19   DG Chest Port 1 View  Result Date: 04/22/2022 CLINICAL DATA:  Status post CABG EXAM: PORTABLE CHEST 1 VIEW COMPARISON:  Chest x-ray dated April 20, 2022 FINDINGS: ETT is proximally 3 cm from the carina. Gastric decompression tube tip and side port project over the stomach. Mediastinal drains. Right IJ line sheath. Cardiac and mediastinal contours within normal limits status post median sternotomy, CABG  and aortic valve replacement. Mild left basilar opacity, likely due to atelectasis. No large pleural effusion or evidence of pneumothorax. IMPRESSION: 1. ETT is 3 cm from the carina. 2. Gastric decompression tube tip and side port are in the stomach. Electronically Signed   By: Yetta Glassman M.D.   On: 04/22/2022 13:34   ECHO INTRAOPERATIVE TEE  Result Date: 04/22/2022  *INTRAOPERATIVE TRANSESOPHAGEAL REPORT *  Patient Name:   CARVEL HUSKINS [facility] Date of Exam: 04/22/2022 Medical Rec #:  [id number]        Height:       67.0 in Accession #:    [id number]       Weight:       188.0 lb Date of Birth:  08-Dec-1955       BSA:          1.97 m Patient Age:    47 years         BP:           110/70 mmHg Patient Gender: M                HR:           75 bpm. Exam Location:  Anesthesiology Transesophogeal exam was perform intraoperatively during surgical procedure. Patient was closely monitored under general anesthesia during the entirety of examination. Indications:     I35.1 Nonrheumatic aortic (valve) insufficiency Performing Phys: Renold Don MD Diagnosing Phys: Renold Don MD Complications: No known complications during this procedure. POST-OP IMPRESSIONS _ Left Ventricle: The left ventricle is unchanged from pre-bypass. _ Right Ventricle: The right ventricle appears unchanged from pre-bypass. _ Aorta: The aorta appears unchanged from pre-bypass. _ Left Atrium: The left atrium appears unchanged from pre-bypass. _ Left Atrial Appendage: The left atrial appendage appears unchanged from pre-bypass. _ Aortic Valve: A bioprosthetic valve was placed. No regurgitation post repair. The gradient recorded across the prosthetic valve is within the expected range. No perivalvular leak noted. _ Mitral Valve: The mitral valve appears unchanged from pre-bypass. _ Tricuspid Valve: The tricuspid valve appears unchanged from pre-bypass. _ Pulmonic Valve: The pulmonic valve appears unchanged from pre-bypass. _ Interatrial Septum: The  interatrial septum appears unchanged from pre-bypass. _ Interventricular Septum: The interventricular septum appears unchanged from pre-bypass. _ Pericardium: The pericardium appears unchanged from pre-bypass. PRE-OP FINDINGS  Left Ventricle: The left ventricle has low normal systolic function, with an ejection fraction of 50-55%. The cavity size was moderately dilated. No evidence of left ventricular regional wall motion abnormalities.  There is moderate concentric left ventricular hypertrophy. Right Ventricle: The right ventricle has normal systolic function. The cavity was normal. There is no increase in right ventricular wall thickness. Left Atrium: Left atrial size was dilated. No left atrial/left atrial appendage thrombus was detected. Right Atrium: Right atrial size was dilated. Interatrial Septum: The interatrial septum was not assessed. Pericardium: There is no evidence of pericardial effusion. Mitral Valve: The mitral valve is normal in structure. Mitral valve regurgitation is mild by color flow Doppler. There is No evidence of mitral stenosis. Tricuspid Valve: The tricuspid valve was normal in structure. Tricuspid valve regurgitation is mild by color flow Doppler. No evidence of tricuspid stenosis is present. Aortic Valve: The aortic valve is tricuspid Aortic valve regurgitation moderate-severe. The jet is with wall-impinging direction. There is no stenosis of the aortic valve. Pulmonic Valve: The pulmonic valve was not assessed. Pulmonic valve regurgitation was not assessed by color flow Doppler. Aorta: The aortic arch are normal in size and structure. There is mild dilatation of the aortic root and of the ascending aorta. Pulmonary Artery: The pulmonary artery is not well seen.  Renold Don MD Electronically signed by Renold Don MD Signature Date/Time: 04/22/2022/1:14:15 PM    Final    DG Chest 2 View  Result Date: 04/20/2022 CLINICAL DATA:  Readmit for open heart surgery. EXAM: CHEST - 2 VIEW  COMPARISON:  06/19/2020 and chest CT on 12/24/2021 FINDINGS: Heart size is normal. Stable appearance of atrial septal closure device. Aorta is tortuous and stable in configuration. Lungs are free of focal consolidations and pleural effusions. No pulmonary edema. IMPRESSION: No active cardiopulmonary disease. Electronically Signed   By: Nolon Nations M.D.   On: 04/20/2022 15:28    EKG: on 10/12 shows NSR at 79 bpm (personally reviewed)  TELEMETRY: AF with variable  (personally reviewed)  Assessment/Plan: 1.  AF with RVR, post op Rates improved to 80-100s mostly, on diltiazem gtt and lopressor Lopressor increased to 25 mg TID, has been gradually up titrated post op.  Eliquis started today, but otherwise he has not been anticoagulated post op. He did have several hours of sinus yesterday.  TEE intra op with EF 50-55% With iodine allergy amiodarone is felt to be contraindicated.  With structural heart disease would potentially consider Tikosyn over Sotalol if rhythm control is preferred.  Agree with increase lopressor.  Will discuss transition off dilt gtt with MD. Can likely give a dose po dilt and then some time after stop.   2. Aortic regurg s/p AV replacement 10/11 3. CAD s/p CABG x 1 10/11 Overall stable post op course apart from AF as above which has been difficulty to control given Iodine allergy.   EP will follow along with you. Dr. Lovena Le to see.   For questions or updates, please contact [place] Please consult [email or web address] for contact info under Cardiology/STEMI.  Jacalyn Lefevre, PA-C  04/27/2022 8:31 AM  EP Attending  Patient seen and examined. Agree with the findings as noted above. The patient has post op atrial fib and is not a candidate for amio due to anaphylactic dye allergy though iodine mediated. On exam he has an IRIRI rhythm and his incision is clean and dry and lungs with minimal rales. He has been rate controlled with a combination of  cardizem and metoprolol. He does not have palpitations and no syncope and bp's soft but stable. I agree with initiation of an [place]. I think he will likely revert back to NSR as  time goes by and if he does not and rate is controlled could be DCCV in 3 weeks. If his rate cannot be controlled then TEE guided DCCV also an option. We will transition to oral cardizem away from IV and see how he does. Digoxin another option. I am not a big fan of sotalol in this situation.   Carleene Overlie Kerrigan Gombos,MD

## 2022-04-27 NOTE — Telephone Encounter (Signed)
Pharmacy Patient Advocate Encounter  Insurance verification completed.    The patient is insured through [employer]   The patient is currently admitted and ran test claims for the following: Eliquis.  Copays and coinsurance results were relayed to Inpatient clinical team. 

## 2022-04-27 NOTE — Progress Notes (Signed)
Patient ID: [name], male   DOB: 04-Dec-1955, 66 y.o.   MRN: [id number] TCTS Evening Rounds:  Hemodynamically stable.  Atrial fib 115 on cardizem 7.  UO ok.  Sats 95% RA.

## 2022-04-27 NOTE — Consult Note (Addendum)
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  Post operatively the patient remained intubated per plan and was admitted to ICU  Operative course unremarkable. No dysrhythmias. Variable neo requirement.  Cross clamp time 72 min Total pump time 99 min  EBL 1090 Cell saver 620   Pertinent  Medical History  CAD Aortic regurg Brain abscess Seizure disorder PFO s/p repair Dvt PE   Significant Hospital Events: Including procedures, antibiotic start and stop dates in addition to other pertinent events   10/11 CABG and AVR. Remains intubated after case. Anatomy not amenable to swan. PCCM consulted 10/12 extubated 10/12 positive for rhinovirus.  10/16 remains in ICU for AF and fluid overload.   Interim History / Subjective:     Objective   Blood pressure 110/80, pulse 88, temperature 98.2 F (36.8 C), temperature source Oral, resp. rate (!) 22, height '5\' 7"'$  (1.702 m), weight 90.2 kg, SpO2 93 %.        Intake/Output Summary (Last 24 hours) at 04/27/2022 0909 Last data filed at 04/27/2022 0600 Gross per 24 hour  Intake 665.89 ml  Output 2605 ml  Net -1939.11 ml    Filed Weights   04/25/22 0332 04/26/22 0445 04/27/22 0500  Weight: 91 kg 90.7 kg 90.2 kg    Examination: General: elderly man lying in bed in NAD HENT: [place]/AT, eyes anicteric Lungs: breathing comfortably on [place], CTAB.  Cardiovascular:  S1S2, RRR, not requiring pacing  Abdomen: soft, NT  Extremities: no c/c/e Neuro: awake, alert, answering questions appropriately, moving alle extremities spontaneously. GU: foley with yellow urine   Ancillary tests personally reviewed:   Creatinine back  to normal at 1.10  Assessment & Plan:   Severe aortic regurg s/p AVR CAD s/p CABG x 1 SVG to RCA Expected post operative ventilator management Expected post op ABLA  - usual postoperative care pathway - lines are out  Critically ill due to difficult to control atrial fibrillation - Currently well rate controlled with metoprolol po and IV diltiazem.  - No amiodarone due to iodine allergy.  - Currently anticoagulated.  - EP has been consulted.  Hyperglycemia -transition to basal bolus insulin -SSI PRN -goal BG <180  Hx OSA -in review of last pulm note with Dr. Annamaria Boots, sounds like pt not on CPAP Allergic rhinitis  Asthma -Dulera BID -albuterol PRN -monitor for nocturnal desaturations  Seizure disorder in setting of prior brain abscess s/p crani (2012) -some residual L sided weakness documented at baseline -con't keppra  AKI now resolved.  -strict I/O -renally dose meds, avoid nephrotoxic meds  Hypothyroidism  -synthroid  GERD  -home PPI  Best Practice (right click and "Reselect all SmartList Selections" daily)   Diet/type: Regular consistency (see orders) DVT prophylaxis: not indicated GI prophylaxis: PPI Lines: Central line and Arterial Line Foley:  Yes, and it is still needed Code Status:  full code Last date of multidisciplinary goals of care discussion '[ ]'$   CRITICAL CARE Performed by: Kipp Brood   Total critical care time: 35 minutes  Critical care time was exclusive of separately billable procedures and treating other patients.  Critical care was necessary to treat or  prevent imminent or life-threatening deterioration.  Critical care was time spent personally by me on the following activities: development of treatment plan with patient and/or surrogate as well as nursing, discussions with consultants, evaluation of patient's response to treatment, examination of patient, obtaining history from patient or surrogate, ordering and performing treatments  and interventions, ordering and review of laboratory studies, ordering and review of radiographic studies, pulse oximetry, re-evaluation of patient's condition and participation in multidisciplinary rounds.  Kipp Brood, MD [facility] ICU Physician [place]  Pager: [phone] Mobile: [phone] After hours: [phone].   04/27/2022, 9:09 AM

## 2022-04-27 NOTE — Progress Notes (Signed)
eLink Physician-Brief Progress Note Patient Name: NAYTHAN DOUTHIT DOB: [birth year] MRN: [id number]   Date of Service  04/27/2022  HPI/Events of Note  Patient c/o cough and request cough syrup.   eICU Interventions  Plan: Robitussin DM 10 mL PO Q 4 hours PRN cough.      Intervention Category Major Interventions: Other:  Lysle Dingwall 04/27/2022, 9:11 PM

## 2022-04-27 NOTE — Progress Notes (Signed)
eLink Physician-Brief Progress Note Patient Name: [name] DOB: Jun 01, 1956 MRN: [id number]   Date of Service  04/27/2022  HPI/Events of Note  Notified of rapid atrial fibrillation with ratre in the 120s-130s. BP 138/68.   Pt is getting metoprolol '25mg'$  BID.   eICU Interventions  Give lopressor '5mg'$  IV PRN for rate control.      Intervention Category Intermediate Interventions: Arrhythmia - evaluation and management  Elsie Lincoln 04/27/2022, 12:29 AM

## 2022-04-27 NOTE — Progress Notes (Signed)
[address]       [place] [address]             [phone]      5 Days Post-Op  Procedure(s) (LRB): AORTIC VALVE REPLACEMENT (AVR) USING 25 MM INSPIRIS RESILIA  AORTIC VALVE (N/A) CORONARY ARTERY BYPASS GRAFTING (CABG) TIMES ONE USE THE ENDOSCOPICALLY HARVESTED RIGHT GREATER SAPHENOUS VEIN (N/A) TRANSESOPHAGEAL ECHOCARDIOGRAM (TEE) (N/A)   Total Length of Stay:  LOS: 5 days    SUBJECTIVE: Has some congestion. Feels "unwell"  With any much movement becomes tachycardic. IV lopressor prn dose best to lower heart rate Tolerating diet COVID testing negative Vitals:   04/27/22 0400 04/27/22 0500  BP: 107/77 105/72  Pulse:    Resp: 17 (!) 22  Temp:    SpO2: 93%     Intake/Output      10/15 0701 10/16 0700 10/16 0701 10/17 0700   P.O. 360    I.V. (mL/kg) 325.9 (3.6)    Total Intake(mL/kg) 685.9 (7.6)    Urine (mL/kg/hr) 2705 (1.2)    Total Output 2705    Net -2019.1             sodium chloride     sodium chloride     diltiazem (CARDIZEM) infusion 15 mg/hr (04/27/22 0600)    CBC    Component Value Date/Time   WBC 10.0 04/27/2022 0301   RBC 2.43 (L) 04/27/2022 0301   HGB 7.6 (L) 04/27/2022 0301   HGB 14.2 03/03/2022 0812   HCT 22.6 (L) 04/27/2022 0301   HCT 42.8 03/03/2022 0812   PLT 234 04/27/2022 0301   PLT 243 03/03/2022 0812   MCV 93.0 04/27/2022 0301   MCV 94 03/03/2022 0812   MCH 31.3 04/27/2022 0301   MCHC 33.6 04/27/2022 0301   RDW 13.7 04/27/2022 0301   RDW 13.2 03/03/2022 0812   LYMPHSABS 1.7 06/19/2020 0934   MONOABS 0.7 06/19/2020 0934   EOSABS 0.1 06/19/2020 0934   BASOSABS 0.0 06/19/2020 0934   CMP     Component Value Date/Time   NA 139 04/27/2022 0301   NA 141 03/03/2022 0812   K 3.5 04/27/2022 0301   CL 106 04/27/2022 0301   CO2 26 04/27/2022 0301   GLUCOSE 117 (H) 04/27/2022 0301   BUN 16 04/27/2022 0301   BUN 19 03/03/2022 0812   CREATININE 1.10 04/27/2022 0301   CALCIUM 8.4 (L) 04/27/2022 0301    PROT 6.9 04/20/2022 1121   PROT 6.7 03/03/2022 0812   ALBUMIN 4.1 04/20/2022 1121   ALBUMIN 4.5 03/03/2022 0812   AST 22 04/20/2022 1121   ALT 17 04/20/2022 1121   ALKPHOS 54 04/20/2022 1121   BILITOT 0.8 04/20/2022 1121   BILITOT 0.7 03/03/2022 0812   GFRNONAA >60 04/27/2022 0301   GFRAA 61 (L) 03/22/2012 0949   ABG    Component Value Date/Time   PHART 7.365 04/22/2022 2331   PCO2ART 40.7 04/22/2022 2331   PO2ART 147 (H) 04/22/2022 2331   HCO3 23.2 04/22/2022 2331   TCO2 29 04/25/2022 0301   ACIDBASEDEF 2.0 04/22/2022 2331   O2SAT 53 04/24/2022 1000   CBG (last 3)  Recent Labs    04/25/22 0458 04/25/22 1114 04/26/22 2007  GLUCAP 131* 98 114*   EXAM: Lungs: some decreased at bases Card: RR with no murmur Ext: warm and dry   ASSESSMENT: SP AVR CABx 1 POD #5 Atrial Fib: difficult to control secondary to iodine allergy. Will ask EP to  consider other class of antiarrhythmic (sotolol?) for chemical conversion. Will continue cardizem for now and increase bb orally. Start eliquis CRI: improving and will continue diuresis Leave in unit for now    Coralie Common, MD '@DATE'$ @

## 2022-04-28 DIAGNOSIS — R579 Shock, unspecified: Secondary | ICD-10-CM | POA: Diagnosis not present

## 2022-04-28 LAB — CBC
HCT: 23.3 % — ABNORMAL LOW (ref 39.0–52.0)
Hemoglobin: 8.1 g/dL — ABNORMAL LOW (ref 13.0–17.0)
MCH: 32 pg (ref 26.0–34.0)
MCHC: 34.8 g/dL (ref 30.0–36.0)
MCV: 92.1 fL (ref 80.0–100.0)
Platelets: 294 10*3/uL (ref 150–400)
RBC: 2.53 MIL/uL — ABNORMAL LOW (ref 4.22–5.81)
RDW: 13.8 % (ref 11.5–15.5)
WBC: 11.2 10*3/uL — ABNORMAL HIGH (ref 4.0–10.5)
nRBC: 0.3 % — ABNORMAL HIGH (ref 0.0–0.2)

## 2022-04-28 LAB — BASIC METABOLIC PANEL
Anion gap: 10 (ref 5–15)
BUN: 18 mg/dL (ref 8–23)
CO2: 26 mmol/L (ref 22–32)
Calcium: 8.9 mg/dL (ref 8.9–10.3)
Chloride: 105 mmol/L (ref 98–111)
Creatinine, Ser: 1.14 mg/dL (ref 0.61–1.24)
GFR, Estimated: >60 mL/min (ref >60)
Glucose, Bld: 136 mg/dL — ABNORMAL HIGH (ref 70–99)
Potassium: 4.1 mmol/L (ref 3.5–5.1)
Sodium: 141 mmol/L (ref 135–145)

## 2022-04-28 LAB — GLUCOSE, CAPILLARY
Glucose-Capillary: 94 mg/dL (ref 70–99)
Glucose-Capillary: 114 mg/dL — ABNORMAL HIGH (ref 70–99)
Glucose-Capillary: 130 mg/dL — ABNORMAL HIGH (ref 70–99)

## 2022-04-28 LAB — MAGNESIUM: Magnesium: 1.8 mg/dL (ref 1.7–2.4)

## 2022-04-28 MED ORDER — SODIUM CHLORIDE 0.9% FLUSH: 3.0000 mL | INTRAVENOUS | Status: DC | PRN

## 2022-04-28 MED ORDER — MAGNESIUM SULFATE 2 GM/50ML IV SOLN
2.0000 g | Freq: Once | INTRAVENOUS | Status: AC
  Administered 2022-04-28: 2 g via INTRAVENOUS
  Filled 2022-04-28: qty 50

## 2022-04-28 MED ORDER — ~~LOC~~ CARDIAC SURGERY, PATIENT & FAMILY EDUCATION: Freq: Once | Status: AC

## 2022-04-28 MED ORDER — ROSUVASTATIN CALCIUM 20 MG PO TABS
40.0000 mg | ORAL_TABLET | Freq: Every day | ORAL | Status: DC
  Administered 2022-04-28 – 2022-05-04 (×7): 40 mg via ORAL
  Filled 2022-04-28 (×7): qty 2

## 2022-04-28 MED ORDER — TRAMADOL HCL 50 MG PO TABS
50.0000 mg | ORAL_TABLET | Freq: Four times a day (QID) | ORAL | Status: DC | PRN
  Administered 2022-04-28 – 2022-05-04 (×2): 50 mg via ORAL
  Filled 2022-04-28 (×2): qty 1

## 2022-04-28 MED ORDER — SODIUM CHLORIDE 0.9 % IV SOLN: 250.0000 mL | INTRAVENOUS | Status: DC | PRN

## 2022-04-28 MED ORDER — SODIUM CHLORIDE 0.9% FLUSH
3.0000 mL | Freq: Two times a day (BID) | INTRAVENOUS | Status: DC
  Administered 2022-04-28 – 2022-05-03 (×7): 3 mL via INTRAVENOUS

## 2022-04-28 NOTE — Progress Notes (Addendum)
      [address]       [place] [address]             [phone]      6 Days Post-Op Procedure(s) (LRB): AORTIC VALVE REPLACEMENT (AVR) USING 25 MM INSPIRIS RESILIA  AORTIC VALVE (N/A) CORONARY ARTERY BYPASS GRAFTING (CABG) TIMES ONE USE THE ENDOSCOPICALLY HARVESTED RIGHT GREATER SAPHENOUS VEIN (N/A) TRANSESOPHAGEAL ECHOCARDIOGRAM (TEE) (N/A)  Subjective:  Continues to have cough.  Did not sleep well last night.  He is experiencing some hallucinations  Objective: Vital signs in last 24 hours: Temp:  [97.7 F (36.5 C)-98.8 F (37.1 C)] 98.5 F (36.9 C) (10/17 0812) Pulse Rate:  [51-127] 88 (10/17 0800) Cardiac Rhythm: Atrial fibrillation (10/16 1951) Resp:  [15-38] 23 (10/17 0900) BP: (80-138)/(62-93) 130/90 (10/17 0900) SpO2:  [80 %-100 %] 97 % (10/17 0900) Weight:  [89.6 kg] 89.6 kg (10/17 0500)  Intake/Output from previous day: 10/16 0701 - 10/17 0700 In: 1662.6 [P.O.:1205; I.V.:234.9; IV Piggyback:47.6] Out: 3220 [Urine:3220] Intake/Output this shift: Total I/O In: 240 [P.O.:240] Out: 350 [Urine:350]  General appearance: alert, cooperative, and no distress Heart: irregularly irregular rhythm Lungs: clear to auscultation bilaterally  Abdomen: soft, non-tender; bowel sounds normal; no masses,  no organomegaly Extremities: edema trace Wound: clean and dry  Lab Results: Recent Labs    04/27/22 0301 04/28/22 0439  WBC 10.0 11.2*  HGB 7.6* 8.1*  HCT 22.6* 23.3*  PLT 234 294   BMET:  Recent Labs    04/27/22 0301 04/28/22 0439  NA 139 141  K 3.5 4.1  CL 106 105  CO2 26 26  GLUCOSE 117* 136*  BUN 16 18  CREATININE 1.10 1.14  CALCIUM 8.4* 8.9    PT/INR: No results for input(s): "LABPROT", "INR" in the last 72 hours. ABG    Component Value Date/Time   PHART 7.365 04/22/2022 2331   HCO3 23.2 04/22/2022 2331   TCO2 29 04/25/2022 0301   ACIDBASEDEF 2.0 04/22/2022 2331   O2SAT 53 04/24/2022 1000   CBG (last 3)  Recent Labs     04/25/22 1114 04/26/22 2007 04/28/22 0814  GLUCAP 98 114* 94    Assessment/Plan: S/P Procedure(s) (LRB): AORTIC VALVE REPLACEMENT (AVR) USING 25 MM INSPIRIS RESILIA  AORTIC VALVE (N/A) CORONARY ARTERY BYPASS GRAFTING (CABG) TIMES ONE USE THE ENDOSCOPICALLY HARVESTED RIGHT GREATER SAPHENOUS VEIN (N/A) TRANSESOPHAGEAL ECHOCARDIOGRAM (TEE) (N/A)  CV- Post operative Atrial Fibrillation/Flutter currently rate control, Cardizem and lopressor doses adjusted... EP will follow from a distance. Pulm- Respiratory symptoms persist, COVID negative, off oxygen,  Renal- creatinine stable at 1.14, weight remains elevated.. continue Lasix, potassium Expected post operative blood loss anemia, mild Hgb at 8.1 Neuro- patient with hallucinations/inability to sleep- agree with stoping narcotic medications CBGs controlled, patient is not a diabetic Dispo- patient stable, medications adjusted appreciate EP assistance, stop all narcotics... patient can transfer to 4E once bed becomes available   LOS: 6 days    Ellwood Handler, PA-C 04/28/2022  Agree with above Has higher heart rates after Pws pulled but no effusion on bedside echo Plan to push BB and cardizem for rate control Will add digoxin if still is an issue later Hopefully go to floor later

## 2022-04-28 NOTE — Progress Notes (Signed)
Following pacer wire removal at 1230, pt began to have increase in HR compared to previous rate controlled Afib in the 80s/90s. Pt remained in the 130s-140s after removal and Erin Barrett, PA was notified due to being after the pacer wire removal. 5 mg of IV metoprolol given, no further orders at this time.

## 2022-04-28 NOTE — Progress Notes (Signed)
  Discussed with Dr. Lynetta Mare and Dr. Lovena Le.   Continue rate control and titrate as needed.   Currently ordered for diltiazem 90 mg q 6 hours.  Can consolidate to 360 mg daily prior to discharge.   Currently ordered for lopressor 50 mg TID.  Can consolidate to BID or toprol prior to discharge.    EP will follow at a distance.   If rate control again becomes an issue would consider TEE/DCC and can re discuss best anti-arrhythmic options.   Please call with questions.   Legrand Como [address]" [place], PA-C  04/28/2022 8:51 AM

## 2022-04-28 NOTE — Progress Notes (Signed)
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  Post operatively the patient remained intubated per plan and was admitted to ICU  Operative course unremarkable. No dysrhythmias. Variable neo requirement.  Cross clamp time 72 min Total pump time 99 min  EBL 1090 Cell saver 620   Pertinent  Medical History  CAD Aortic regurg Brain abscess Seizure disorder PFO s/p repair Dvt PE   Significant Hospital Events: Including procedures, antibiotic start and stop dates in addition to other pertinent events   10/11 CABG and AVR. Remains intubated after case. Anatomy not amenable to swan. PCCM consulted 10/12 extubated 10/12 positive for rhinovirus.  10/16 remains in ICU for AF and fluid overload.   Interim History / Subjective:   Continues to complain of cough. Severe sleep deprivation and having visual hallucinations.  HR now well controlled even with ambulation.   Objective   Blood pressure 122/75, pulse 77, temperature 98.5 F (36.9 C), temperature source Oral, resp. rate (!) 24, height '5\' 7"'$  (1.702 m), weight 89.6 kg, SpO2 98 %.        Intake/Output Summary (Last 24 hours) at 04/28/2022 0844 Last data filed at 04/28/2022 0700 Gross per 24 hour  Intake 1647.63 ml  Output 3220 ml  Net -1572.37 ml    Filed Weights   04/26/22 0445 04/27/22 0500 04/28/22 0500  Weight: 90.7 kg 90.2 kg 89.6 kg    Examination: General: elderly man lying in bed in NAD HENT: [place]/AT, eyes anicteric Lungs: breathing comfortably on , CTAB.  Cardiovascular:  S1S2, RRR, not requiring pacing  Abdomen: soft, NT  Extremities: no c/c/e Neuro: awake, alert, answering questions  appropriately, moving alle extremities spontaneously. GU: foley with yellow urine  Ancillary tests personally reviewed:   Creatinine back to normal at 1.14 FT4: 0.92 EKG shows Aflutter with variable block Assessment & Plan:   Severe aortic regurg s/p AVR CAD s/p CABG x 1 SVG to RCA Expected post operative ventilator management Expected post op ABLA  - usual postoperative care pathway - lines are out - PM still need to come out  Was critically ill due to difficult to control atrial fibrillation - Currently well rate controlled with metoprolol and diltiazem PO. In rate controlled flutter.  - No amiodarone due to iodine allergy.  - Currently anticoagulated.  - EP has agrees with rate control strategy.   Delirium - Stop tussonex - Change oxycodone to tramadol - Transfer to floor if possible.   Hyperglycemia -transition to basal bolus insulin -SSI PRN -goal BG <180  Hx OSA -in review of last pulm note with Dr. Annamaria Boots, sounds like pt not on CPAP Allergic rhinitis  Asthma -Dulera BID -albuterol PRN -monitor for nocturnal desaturations  Seizure disorder in setting of prior brain abscess s/p crani (2012) -some residual L sided weakness documented at baseline -con't keppra  AKI now resolved.  -strict I/O -renally dose meds, avoid nephrotoxic meds  Hypothyroidism  -synthroid  GERD  -home PPI  Best Practice (right click and "Reselect all SmartList Selections" daily)   Diet/type: Regular consistency (see orders) DVT prophylaxis: not indicated GI prophylaxis: PPI Lines: Central line and Arterial Line Foley:  Yes, and it is still needed Code Status:  full code Last date of multidisciplinary goals of care discussion '[ ]'$    Kipp Brood, MD [facility] ICU Physician [place]  Pager: [phone] Mobile: [phone] After hours: [phone].   04/28/2022, 8:44 AM

## 2022-04-28 NOTE — Progress Notes (Signed)
  Transition of Care [facility]) Screening Note   Patient Details  Name: [name] Date of Birth: [birth year]   Transition of Care [facility]) CM/SW Contact:    Milas Gain, [place] Phone Number: 04/28/2022, 4:50 PM    Transition of Care Department [facility]) has reviewed patient and no TOC needs have been identified at this time. We will continue to monitor patient advancement through interdisciplinary progression rounds. If new patient transition needs arise, please place a TOC consult.

## 2022-04-28 NOTE — Plan of Care (Signed)
Problem: Education: Goal: Will demonstrate proper wound care and an understanding of methods to prevent future damage Outcome: Progressing Goal: Knowledge of disease or condition will improve Outcome: Progressing Goal: Knowledge of the prescribed therapeutic regimen will improve Outcome: Progressing Goal: Individualized Educational Video(s) Outcome: Progressing   Problem: Activity: Goal: Risk for activity intolerance will decrease Outcome: Progressing   Problem: Cardiac: Goal: Will achieve and/or maintain hemodynamic stability Outcome: Not Progressing Note: R/t patient remains in AFIB   Problem: Clinical Measurements: Goal: Postoperative complications will be avoided or minimized Outcome: Progressing   Problem: Respiratory: Goal: Respiratory status will improve Outcome: Progressing   Problem: Skin Integrity: Goal: Wound healing without signs and symptoms of infection Outcome: Progressing Goal: Risk for impaired skin integrity will decrease Outcome: Progressing   Problem: Urinary Elimination: Goal: Ability to achieve and maintain adequate renal perfusion and functioning will improve Outcome: Not Progressing Note: R/t urinary retention patient had to have foley replaced   Problem: Education: Goal: Will demonstrate proper wound care and an understanding of methods to prevent future damage Outcome: Progressing Goal: Knowledge of disease or condition will improve Outcome: Progressing Goal: Knowledge of the prescribed therapeutic regimen will improve Outcome: Progressing Goal: Individualized Educational Video(s) Outcome: Progressing   Problem: Activity: Goal: Risk for activity intolerance will decrease Outcome: Progressing   Problem: Cardiac: Goal: Will achieve and/or maintain hemodynamic stability Outcome: Progressing   Problem: Clinical Measurements: Goal: Postoperative complications will be avoided or minimized Outcome: Progressing   Problem:  Respiratory: Goal: Respiratory status will improve Outcome: Progressing   Problem: Skin Integrity: Goal: Wound healing without signs and symptoms of infection Outcome: Progressing Goal: Risk for impaired skin integrity will decrease Outcome: Progressing   Problem: Urinary Elimination: Goal: Ability to achieve and maintain adequate renal perfusion and functioning will improve Outcome: Not Progressing   Problem: Education: Goal: Knowledge of General Education information will improve Description: Including pain rating scale, medication(s)/side effects and non-pharmacologic comfort measures Outcome: Progressing   Problem: Health Behavior/Discharge Planning: Goal: Ability to manage health-related needs will improve Outcome: Progressing   Problem: Clinical Measurements: Goal: Ability to maintain clinical measurements within normal limits will improve Outcome: Progressing Goal: Will remain free from infection Outcome: Progressing Goal: Diagnostic test results will improve Outcome: Progressing Goal: Respiratory complications will improve Outcome: Progressing Goal: Cardiovascular complication will be avoided Outcome: Progressing   Problem: Activity: Goal: Risk for activity intolerance will decrease Outcome: Progressing   Problem: Nutrition: Goal: Adequate nutrition will be maintained Outcome: Progressing   Problem: Coping: Goal: Level of anxiety will decrease Outcome: Progressing   Problem: Elimination: Goal: Will not experience complications related to bowel motility Outcome: Progressing Goal: Will not experience complications related to urinary retention Outcome: Progressing   Problem: Pain Managment: Goal: General experience of comfort will improve Outcome: Progressing   Problem: Safety: Goal: Ability to remain free from injury will improve Outcome: Progressing   Problem: Skin Integrity: Goal: Risk for impaired skin integrity will decrease Outcome:  Progressing   Problem: Activity: Goal: Ability to tolerate increased activity will improve Outcome: Progressing   Problem: Respiratory: Goal: Ability to maintain a clear airway and adequate ventilation will improve Outcome: Progressing   Problem: Role Relationship: Goal: Method of communication will improve Outcome: Progressing   Problem: Education: Goal: Ability to demonstrate management of disease process will improve Outcome: Progressing Goal: Ability to verbalize understanding of medication therapies will improve Outcome: Progressing Goal: Individualized Educational Video(s) Outcome: Progressing   Problem: Activity: Goal: Capacity to carry out activities will improve Outcome:  Progressing   Problem: Cardiac: Goal: Ability to achieve and maintain adequate cardiopulmonary perfusion will improve Outcome: Progressing

## 2022-04-28 NOTE — Progress Notes (Signed)
1930 patient alert up in chair reclined patient sliding out repositioned having patient stand patient wants to go to bed asap 2000 CHG and bath completed all linens changed teeth brushed patient got back in bed PRN cough medication given per patient request. 2130 patient placed on call light concerns with overnight construction patient hearing dors open and close on the unit, reassured patient no construction encouraged patient to turn off lights and television to get rest for delirium. 2230 patient assisted to bathroom in room did not want to use urinal. Patient concerned that people keep walking by his room curtain pulled when leaving and educated on this being ICU and staff working here to care for other patients.

## 2022-04-29 LAB — CBC
HCT: 24.6 % — ABNORMAL LOW (ref 39.0–52.0)
Hemoglobin: 8 g/dL — ABNORMAL LOW (ref 13.0–17.0)
MCH: 30.8 pg (ref 26.0–34.0)
MCHC: 32.5 g/dL (ref 30.0–36.0)
MCV: 94.6 fL (ref 80.0–100.0)
Platelets: 338 10*3/uL (ref 150–400)
RBC: 2.6 MIL/uL — ABNORMAL LOW (ref 4.22–5.81)
RDW: 14.1 % (ref 11.5–15.5)
WBC: 11.7 10*3/uL — ABNORMAL HIGH (ref 4.0–10.5)
nRBC: 0.2 % (ref 0.0–0.2)

## 2022-04-29 LAB — BASIC METABOLIC PANEL
Anion gap: 10 (ref 5–15)
BUN: 17 mg/dL (ref 8–23)
CO2: 26 mmol/L (ref 22–32)
Calcium: 8.9 mg/dL (ref 8.9–10.3)
Chloride: 102 mmol/L (ref 98–111)
Creatinine, Ser: 1.1 mg/dL (ref 0.61–1.24)
GFR, Estimated: >60 mL/min (ref >60)
Glucose, Bld: 106 mg/dL — ABNORMAL HIGH (ref 70–99)
Potassium: 4.5 mmol/L (ref 3.5–5.1)
Sodium: 138 mmol/L (ref 135–145)

## 2022-04-29 LAB — MAGNESIUM: Magnesium: 2 mg/dL (ref 1.7–2.4)

## 2022-04-29 LAB — PROTIME-INR
INR: 1.4 — ABNORMAL HIGH (ref 0.8–1.2)
Prothrombin Time: 16.8 seconds — ABNORMAL HIGH (ref 11.4–15.2)

## 2022-04-29 LAB — GLUCOSE, CAPILLARY: Glucose-Capillary: 103 mg/dL — ABNORMAL HIGH (ref 70–99)

## 2022-04-29 MED ORDER — METOPROLOL TARTRATE 25 MG PO TABS
25.0000 mg | ORAL_TABLET | Freq: Once | ORAL | Status: AC
  Administered 2022-04-29: 25 mg via ORAL
  Filled 2022-04-29: qty 1

## 2022-04-29 MED ORDER — CALCIUM CHLORIDE 10 % IV SOLN
INTRAVENOUS | Status: AC
  Filled 2022-04-29: qty 30

## 2022-04-29 MED ORDER — SODIUM CHLORIDE 0.9 % IV SOLN: INTRAVENOUS | Status: DC

## 2022-04-29 MED ORDER — FUROSEMIDE 40 MG PO TABS
40.0000 mg | ORAL_TABLET | Freq: Two times a day (BID) | ORAL | Status: DC
  Administered 2022-04-29 – 2022-05-03 (×7): 40 mg via ORAL
  Filled 2022-04-29 (×7): qty 1

## 2022-04-29 NOTE — Progress Notes (Signed)
[address]       [place], [address]             [phone]      7 Days Post-Op  Procedure(s) (LRB): AORTIC VALVE REPLACEMENT (AVR) USING 25 MM INSPIRIS RESILIA  AORTIC VALVE (N/A) CORONARY ARTERY BYPASS GRAFTING (CABG) TIMES ONE USE THE ENDOSCOPICALLY HARVESTED RIGHT GREATER SAPHENOUS VEIN (N/A) TRANSESOPHAGEAL ECHOCARDIOGRAM (TEE) (N/A)   Total Length of Stay:  LOS: 7 days    SUBJECTIVE: Has returned to rapid afib and pt discouraged No CP or SOB  Vitals:   04/29/22 0603 04/29/22 0758  BP:    Pulse:    Resp: (!) 24   Temp:  97.8 F (36.6 C)  SpO2: (!) 77%     Intake/Output      10/17 0701 10/18 0700 10/18 0701 10/19 0700   P.O. 830    I.V. (mL/kg)     Other     IV Piggyback     Total Intake(mL/kg) 830 (9.4)    Urine (mL/kg/hr) 650 (0.3)    Stool     Total Output 650    Net +180         Urine Occurrence 5 x        sodium chloride      CBC    Component Value Date/Time   WBC 11.7 (H) 04/29/2022 0513   RBC 2.60 (L) 04/29/2022 0513   HGB 8.0 (L) 04/29/2022 0513   HGB 14.2 03/03/2022 0812   HCT 24.6 (L) 04/29/2022 0513   HCT 42.8 03/03/2022 0812   PLT 338 04/29/2022 0513   PLT 243 03/03/2022 0812   MCV 94.6 04/29/2022 0513   MCV 94 03/03/2022 0812   MCH 30.8 04/29/2022 0513   MCHC 32.5 04/29/2022 0513   RDW 14.1 04/29/2022 0513   RDW 13.2 03/03/2022 0812   LYMPHSABS 1.7 06/19/2020 0934   MONOABS 0.7 06/19/2020 0934   EOSABS 0.1 06/19/2020 0934   BASOSABS 0.0 06/19/2020 0934   CMP     Component Value Date/Time   NA 138 04/29/2022 0513   NA 141 03/03/2022 0812   K 4.5 04/29/2022 0513   CL 102 04/29/2022 0513   CO2 26 04/29/2022 0513   GLUCOSE 106 (H) 04/29/2022 0513   BUN 17 04/29/2022 0513   BUN 19 03/03/2022 0812   CREATININE 1.10 04/29/2022 0513   CALCIUM 8.9 04/29/2022 0513   PROT 6.9 04/20/2022 1121   PROT 6.7 03/03/2022 0812   ALBUMIN 4.1 04/20/2022 1121   ALBUMIN 4.5 03/03/2022 0812   AST 22  04/20/2022 1121   ALT 17 04/20/2022 1121   ALKPHOS 54 04/20/2022 1121   BILITOT 0.8 04/20/2022 1121   BILITOT 0.7 03/03/2022 0812   GFRNONAA >60 04/29/2022 0513   GFRAA 61 (L) 03/22/2012 0949   ABG    Component Value Date/Time   PHART 7.365 04/22/2022 2331   PCO2ART 40.7 04/22/2022 2331   PO2ART 147 (H) 04/22/2022 2331   HCO3 23.2 04/22/2022 2331   TCO2 29 04/25/2022 0301   ACIDBASEDEF 2.0 04/22/2022 2331   O2SAT 53 04/24/2022 1000   CBG (last 3)  Recent Labs    04/28/22 0814 04/28/22 1130 04/28/22 1623  GLUCAP 94 114* 130*  EXAM Lungs: clear Card: tachy Ext: warm   ASSESSMENT: POD #7 sp AVR and CABG Afib: has EP involved and plan is tikosyn load and TEE cardioversion tomorrow Cont BB and Cardizem for now On Eliquis Lasix today  Coralie Common, MD '@DATE'$ @

## 2022-04-29 NOTE — Progress Notes (Signed)
CARDIAC REHAB PHASE I   PRE:  Rate/Rhythm: 99 afib    BP: sitting 106/74    SaO2: 95 RA  MODE:  Ambulation: 170 ft   POST:  Rate/Rhythm: 145 afib    BP: sitting 129/76     SaO2: 98 RA  Pt stood independently and walked with RW. Slow and steady, c/o SOB and fatigue requiring numerous short standing rest stops. HR gradually increased with distance to upper 130s toward end of walk, then 145 afib sitting.  Decreased with rest. Encouraged walking short distances and IS.  [phone]  Yves Dill BS, ACSM-CEP 04/29/2022 2:21 PM

## 2022-04-29 NOTE — Plan of Care (Signed)
Problem: Education: Goal: Will demonstrate proper wound care and an understanding of methods to prevent future damage Outcome: Progressing Goal: Knowledge of disease or condition will improve Outcome: Progressing Goal: Knowledge of the prescribed therapeutic regimen will improve Outcome: Progressing Goal: Individualized Educational Video(s) Outcome: Progressing   Problem: Activity: Goal: Risk for activity intolerance will decrease Outcome: Not Progressing Note: R/T increase in HR 140s   Problem: Cardiac: Goal: Will achieve and/or maintain hemodynamic stability Outcome: Progressing   Problem: Clinical Measurements: Goal: Postoperative complications will be avoided or minimized Outcome: Progressing   Problem: Respiratory: Goal: Respiratory status will improve Outcome: Not Progressing Note: R/T SOB with activity   Problem: Skin Integrity: Goal: Wound healing without signs and symptoms of infection Outcome: Progressing Goal: Risk for impaired skin integrity will decrease Outcome: Progressing   Problem: Urinary Elimination: Goal: Ability to achieve and maintain adequate renal perfusion and functioning will improve Outcome: Progressing   Problem: Education: Goal: Will demonstrate proper wound care and an understanding of methods to prevent future damage Outcome: Progressing Goal: Knowledge of disease or condition will improve Outcome: Progressing Goal: Knowledge of the prescribed therapeutic regimen will improve Outcome: Progressing Goal: Individualized Educational Video(s) Outcome: Progressing   Problem: Activity: Goal: Risk for activity intolerance will decrease Outcome: Not Progressing   Problem: Cardiac: Goal: Will achieve and/or maintain hemodynamic stability Outcome: Not Progressing   Problem: Clinical Measurements: Goal: Postoperative complications will be avoided or minimized Outcome: Progressing   Problem: Respiratory: Goal: Respiratory status will  improve Outcome: Not Progressing   Problem: Skin Integrity: Goal: Wound healing without signs and symptoms of infection Outcome: Progressing Goal: Risk for impaired skin integrity will decrease Outcome: Progressing   Problem: Urinary Elimination: Goal: Ability to achieve and maintain adequate renal perfusion and functioning will improve Outcome: Progressing   Problem: Education: Goal: Knowledge of General Education information will improve Description: Including pain rating scale, medication(s)/side effects and non-pharmacologic comfort measures Outcome: Progressing   Problem: Health Behavior/Discharge Planning: Goal: Ability to manage health-related needs will improve Outcome: Progressing   Problem: Clinical Measurements: Goal: Ability to maintain clinical measurements within normal limits will improve Outcome: Progressing Goal: Will remain free from infection Outcome: Progressing Goal: Diagnostic test results will improve Outcome: Progressing Goal: Respiratory complications will improve Outcome: Not Progressing Goal: Cardiovascular complication will be avoided Outcome: Progressing   Problem: Activity: Goal: Risk for activity intolerance will decrease Outcome: Not Progressing   Problem: Nutrition: Goal: Adequate nutrition will be maintained Outcome: Progressing   Problem: Coping: Goal: Level of anxiety will decrease Outcome: Progressing   Problem: Elimination: Goal: Will not experience complications related to bowel motility Outcome: Progressing Goal: Will not experience complications related to urinary retention Outcome: Progressing   Problem: Pain Managment: Goal: General experience of comfort will improve Outcome: Progressing   Problem: Safety: Goal: Ability to remain free from injury will improve Outcome: Progressing   Problem: Skin Integrity: Goal: Risk for impaired skin integrity will decrease Outcome: Progressing   Problem: Activity: Goal:  Ability to tolerate increased activity will improve Outcome: Progressing   Problem: Respiratory: Goal: Ability to maintain a clear airway and adequate ventilation will improve Outcome: Progressing   Problem: Role Relationship: Goal: Method of communication will improve Outcome: Progressing   Problem: Education: Goal: Ability to demonstrate management of disease process will improve Outcome: Progressing Goal: Ability to verbalize understanding of medication therapies will improve Outcome: Progressing Goal: Individualized Educational Video(s) Outcome: Progressing   Problem: Activity: Goal: Capacity to carry out activities will improve Outcome: Progressing  Problem: Cardiac: Goal: Ability to achieve and maintain adequate cardiopulmonary perfusion will improve Outcome: Progressing   

## 2022-04-29 NOTE — Progress Notes (Addendum)
Electrophysiology Rounding Note  Patient Name: [name] Date of Encounter: 04/29/2022  Primary Cardiologist: None Electrophysiologist: new to Dr. Lovena Le   Subjective   The patient is feeling OK at rest this am. Anxious to go home. Has remained in AF and rates again poorly controlled.   Inpatient Medications    Scheduled Meds:  apixaban  5 mg Oral BID   aspirin EC  81 mg Oral Daily   bisacodyl  10 mg Oral Daily   Or   bisacodyl  10 mg Rectal Daily   Chlorhexidine Gluconate Cloth  6 each Topical Daily   dapagliflozin propanediol  10 mg Oral Daily   diltiazem  90 mg Oral Q6H   docusate sodium  200 mg Oral Daily   escitalopram  10 mg Oral Daily   ferrous sulfate  325 mg Oral BID PC   fluticasone  1 spray Each Nare Daily   furosemide  40 mg Oral Daily   levETIRAcetam  500 mg Oral BID   levothyroxine  100 mcg Oral Q T,Th,S,Su-1800   levothyroxine  50 mcg Oral Q M,W,F   loratadine  10 mg Oral Daily   metoprolol tartrate  50 mg Oral TID   pantoprazole  40 mg Oral Daily   polyethylene glycol  17 g Oral Daily   potassium chloride  40 mEq Oral BID   rosuvastatin  40 mg Oral Daily   sodium chloride flush  3 mL Intravenous Q12H   tamsulosin  0.4 mg Oral Daily   Continuous Infusions:  sodium chloride     PRN Meds: sodium chloride, azelastine, dextrose, guaiFENesin-dextromethorphan, metoprolol tartrate, sodium chloride flush, traMADol   Vital Signs    Vitals:   04/29/22 0400 04/29/22 0420 04/29/22 0423 04/29/22 0603  BP: 123/75     Pulse:      Resp: (!) 24 (!) 27 (!) 25 (!) 24  Temp:      TempSrc:      SpO2: 93% (!) 81% (!) 87% (!) 77%  Weight:   88.6 kg   Height:        Intake/Output Summary (Last 24 hours) at 04/29/2022 0742 Last data filed at 04/29/2022 0130 Gross per 24 hour  Intake 830 ml  Output 650 ml  Net 180 ml   Filed Weights   04/27/22 0500 04/28/22 0500 04/29/22 0423  Weight: 90.2 kg 89.6 kg 88.6 kg    Physical Exam    GEN- The  patient is well appearing, alert and oriented x 3 today.   Head- normocephalic, atraumatic Eyes-  Sclera clear, conjunctiva pink Ears- hearing intact Oropharynx- clear Neck- supple Lungs- Clear to ausculation bilaterally, normal work of breathing Heart- Tachy Irregularly irregular rate and rhythm, no murmurs, rubs or gallops GI- soft, NT, ND, + BS Extremities- no clubbing or cyanosis. No edema Skin- no rash or lesion Psych- euthymic mood, full affect Neuro- strength and sensation are intact  Labs    CBC Recent Labs    04/28/22 0439 04/29/22 0513  WBC 11.2* 11.7*  HGB 8.1* 8.0*  HCT 23.3* 24.6*  MCV 92.1 94.6  PLT 294 242   Basic Metabolic Panel Recent Labs    04/28/22 0439 04/29/22 0513  NA 141 138  K 4.1 4.5  CL 105 102  CO2 26 26  GLUCOSE 136* 106*  BUN 18 17  CREATININE 1.14 1.10  CALCIUM 8.9 8.9  MG 1.8 2.0   Liver Function Tests No results for input(s): "AST", "ALT", "ALKPHOS", "BILITOT", "PROT", "ALBUMIN"  in the last 72 hours. No results for input(s): "LIPASE", "AMYLASE" in the last 72 hours. Cardiac Enzymes No results for input(s): "CKTOTAL", "CKMB", "CKMBINDEX", "TROPONINI" in the last 72 hours.   Telemetry    Remains in fib flutter, rates mostly 90-110s yesterday, but gradually decreased overnight. Currently in 140-150s (personally reviewed)  Radiology    No results found.  Patient Profile     [name] is a 66 y.o. 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 Lavonna Monarch.  Assessment & Plan    1.  AF with RVR, post op Rates had improved but now elevated again.  Continue lopressor 50 mg TID  Continue diltiazem 90 q 6 hrs.  TEE intra op with EF 50-55% Will plan for TEE/DCC tomorrow, and then with structural heart  disease will initiate Tikosyn.  With iodine allergy amiodarone is felt to be contraindicated.  Can transition back to dilt gtt if need for additional rate control pending TEE/DCC tomorrow. Pt had already eaten breakfast today.    2. Aortic regurg s/p AV replacement 10/11 3. CAD s/p CABG x 1 10/11 Overall stable post op course apart from AF as above which has been difficulty to control given Iodine allergy as above.   Dr. Lovena Le has seen and discussed with Dr. Lavonna Monarch. Plan for TEE/ [facility] tomorrow then initiating Tikosyn. Cannot initiate Tikosyn today with risk of possible chemical cardioversion with risk of clot formation being in AF for > 48hrs.   For questions or updates, please contact [place] Please consult [email or web address] for contact info under Cardiology/STEMI.  Signed, Shirley Friar, PA-C  04/29/2022, 7:42 AM   EP Attending  Patient seen and examined. He has gone from well controlled atrial fib to atrial fib with a RVR despite high dose AV nodal blocking drugs. As he cannot take amiodarone, and has been out of rhythm for over 3 days, he will undergo TEE guided DCCV and initiation of dofetilide. Unfortunately he will require tele for 3 days prior to discharge.   [name] Leroy Akamine,MD

## 2022-04-29 NOTE — H&P (View-Only) (Signed)
Electrophysiology Rounding Note  Patient Name: [name] Date of Encounter: 04/29/2022  Primary Cardiologist: None Electrophysiologist: new to Dr. Lovena Le   Subjective   The patient is feeling OK at rest this am. Anxious to go home. Has remained in AF and rates again poorly controlled.   Inpatient Medications    Scheduled Meds:  apixaban  5 mg Oral BID   aspirin EC  81 mg Oral Daily   bisacodyl  10 mg Oral Daily   Or   bisacodyl  10 mg Rectal Daily   Chlorhexidine Gluconate Cloth  6 each Topical Daily   dapagliflozin propanediol  10 mg Oral Daily   diltiazem  90 mg Oral Q6H   docusate sodium  200 mg Oral Daily   escitalopram  10 mg Oral Daily   ferrous sulfate  325 mg Oral BID PC   fluticasone  1 spray Each Nare Daily   furosemide  40 mg Oral Daily   levETIRAcetam  500 mg Oral BID   levothyroxine  100 mcg Oral Q T,Th,S,Su-1800   levothyroxine  50 mcg Oral Q M,W,F   loratadine  10 mg Oral Daily   metoprolol tartrate  50 mg Oral TID   pantoprazole  40 mg Oral Daily   polyethylene glycol  17 g Oral Daily   potassium chloride  40 mEq Oral BID   rosuvastatin  40 mg Oral Daily   sodium chloride flush  3 mL Intravenous Q12H   tamsulosin  0.4 mg Oral Daily   Continuous Infusions:  sodium chloride     PRN Meds: sodium chloride, azelastine, dextrose, guaiFENesin-dextromethorphan, metoprolol tartrate, sodium chloride flush, traMADol   Vital Signs    Vitals:   04/29/22 0400 04/29/22 0420 04/29/22 0423 04/29/22 0603  BP: 123/75     Pulse:      Resp: (!) 24 (!) 27 (!) 25 (!) 24  Temp:      TempSrc:      SpO2: 93% (!) 81% (!) 87% (!) 77%  Weight:   88.6 kg   Height:        Intake/Output Summary (Last 24 hours) at 04/29/2022 0742 Last data filed at 04/29/2022 0130 Gross per 24 hour  Intake 830 ml  Output 650 ml  Net 180 ml   Filed Weights   04/27/22 0500 04/28/22 0500 04/29/22 0423  Weight: 90.2 kg 89.6 kg 88.6 kg    Physical Exam    GEN- The  patient is well appearing, alert and oriented x 3 today.   Head- normocephalic, atraumatic Eyes-  Sclera clear, conjunctiva pink Ears- hearing intact Oropharynx- clear Neck- supple Lungs- Clear to ausculation bilaterally, normal work of breathing Heart- Tachy Irregularly irregular rate and rhythm, no murmurs, rubs or gallops GI- soft, NT, ND, + BS Extremities- no clubbing or cyanosis. No edema Skin- no rash or lesion Psych- euthymic mood, full affect Neuro- strength and sensation are intact  Labs    CBC Recent Labs    04/28/22 0439 04/29/22 0513  WBC 11.2* 11.7*  HGB 8.1* 8.0*  HCT 23.3* 24.6*  MCV 92.1 94.6  PLT 294 664   Basic Metabolic Panel Recent Labs    04/28/22 0439 04/29/22 0513  NA 141 138  K 4.1 4.5  CL 105 102  CO2 26 26  GLUCOSE 136* 106*  BUN 18 17  CREATININE 1.14 1.10  CALCIUM 8.9 8.9  MG 1.8 2.0   Liver Function Tests No results for input(s): "AST", "ALT", "ALKPHOS", "BILITOT", "PROT", "ALBUMIN"  in the last 72 hours. No results for input(s): "LIPASE", "AMYLASE" in the last 72 hours. Cardiac Enzymes No results for input(s): "CKTOTAL", "CKMB", "CKMBINDEX", "TROPONINI" in the last 72 hours.   Telemetry    Remains in fib flutter, rates mostly 90-110s yesterday, but gradually decreased overnight. Currently in 140-150s (personally reviewed)  Radiology    No results found.  Patient Profile     [name] is a 66 y.o. 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 Lavonna Monarch.  Assessment & Plan    1.  AF with RVR, post op Rates had improved but now elevated again.  Continue lopressor 50 mg TID  Continue diltiazem 90 q 6 hrs.  TEE intra op with EF 50-55% Will plan for TEE/DCC tomorrow, and then with structural heart  disease will initiate Tikosyn.  With iodine allergy amiodarone is felt to be contraindicated.  Can transition back to dilt gtt if need for additional rate control pending TEE/DCC tomorrow. Pt had already eaten breakfast today.    2. Aortic regurg s/p AV replacement 10/11 3. CAD s/p CABG x 1 10/11 Overall stable post op course apart from AF as above which has been difficulty to control given Iodine allergy as above.   Dr. Lovena Le has seen and discussed with Dr. Lavonna Monarch. Plan for TEE/ [facility] tomorrow then initiating Tikosyn. Cannot initiate Tikosyn today with risk of possible chemical cardioversion with risk of clot formation being in AF for > 48hrs.   For questions or updates, please contact [place] Please consult [email or web address] for contact info under Cardiology/STEMI.  Signed, Shirley Friar, PA-C  04/29/2022, 7:42 AM   EP Attending  Patient seen and examined. He has gone from well controlled atrial fib to atrial fib with a RVR despite high dose AV nodal blocking drugs. As he cannot take amiodarone, and has been out of rhythm for over 3 days, he will undergo TEE guided DCCV and initiation of dofetilide. Unfortunately he will require tele for 3 days prior to discharge.   Carleene Overlie Jowel Waltner,MD

## 2022-04-30 ENCOUNTER — Inpatient Hospital Stay (HOSPITAL_COMMUNITY): Payer: 59

## 2022-04-30 ENCOUNTER — Encounter (HOSPITAL_COMMUNITY)
Admission: RE | Disposition: A | Discharge status: Home / Self Care | Payer: Self-pay | Source: Home / Self Care | Attending: Thoracic Surgery (Cardiothoracic Vascular Surgery)

## 2022-04-30 ENCOUNTER — Other Ambulatory Visit (HOSPITAL_COMMUNITY): Payer: Self-pay

## 2022-04-30 ENCOUNTER — Encounter (HOSPITAL_COMMUNITY): Payer: Self-pay | Admitting: Thoracic Surgery (Cardiothoracic Vascular Surgery)

## 2022-04-30 ENCOUNTER — Inpatient Hospital Stay (HOSPITAL_COMMUNITY): Payer: 59 | Admitting: General Practice

## 2022-04-30 ENCOUNTER — Telehealth (HOSPITAL_COMMUNITY): Payer: Self-pay

## 2022-04-30 DIAGNOSIS — J45909 Unspecified asthma, uncomplicated: Secondary | ICD-10-CM

## 2022-04-30 DIAGNOSIS — Z9989 Dependence on other enabling machines and devices: Secondary | ICD-10-CM

## 2022-04-30 DIAGNOSIS — I4891 Unspecified atrial fibrillation: Secondary | ICD-10-CM

## 2022-04-30 DIAGNOSIS — G4733 Obstructive sleep apnea (adult) (pediatric): Secondary | ICD-10-CM

## 2022-04-30 DIAGNOSIS — Z9289 Personal history of other medical treatment: Secondary | ICD-10-CM

## 2022-04-30 DIAGNOSIS — I340 Nonrheumatic mitral (valve) insufficiency: Secondary | ICD-10-CM

## 2022-04-30 DIAGNOSIS — I3139 Other pericardial effusion (noninflammatory): Secondary | ICD-10-CM

## 2022-04-30 HISTORY — PX: CARDIOVERSION: SHX1299

## 2022-04-30 HISTORY — PX: TEE WITHOUT CARDIOVERSION: SHX5443

## 2022-04-30 LAB — BASIC METABOLIC PANEL
Anion gap: 7 (ref 5–15)
BUN: 19 mg/dL (ref 8–23)
CO2: 27 mmol/L (ref 22–32)
Calcium: 8.9 mg/dL (ref 8.9–10.3)
Chloride: 102 mmol/L (ref 98–111)
Creatinine, Ser: 1.18 mg/dL (ref 0.61–1.24)
GFR, Estimated: >60 mL/min (ref >60)
Glucose, Bld: 117 mg/dL — ABNORMAL HIGH (ref 70–99)
Potassium: 4.8 mmol/L (ref 3.5–5.1)
Sodium: 136 mmol/L (ref 135–145)

## 2022-04-30 LAB — MAGNESIUM: Magnesium: 2.1 mg/dL (ref 1.7–2.4)

## 2022-04-30 SURGERY — ECHOCARDIOGRAM, TRANSESOPHAGEAL
Anesthesia: General

## 2022-04-30 MED ORDER — SODIUM CHLORIDE 0.9% FLUSH
3.0000 mL | Freq: Two times a day (BID) | INTRAVENOUS | Status: DC
  Administered 2022-04-30 – 2022-05-04 (×5): 3 mL via INTRAVENOUS

## 2022-04-30 MED ORDER — SODIUM CHLORIDE 0.9% FLUSH: 3.0000 mL | INTRAVENOUS | Status: DC | PRN

## 2022-04-30 MED ORDER — PROPOFOL 500 MG/50ML IV EMUL
INTRAVENOUS | Status: DC | PRN
  Administered 2022-04-30: 150 ug/kg/min via INTRAVENOUS

## 2022-04-30 MED ORDER — DOFETILIDE 500 MCG PO CAPS
500.0000 ug | ORAL_CAPSULE | Freq: Two times a day (BID) | ORAL | Status: DC
  Administered 2022-04-30 – 2022-05-01 (×2): 500 ug via ORAL
  Filled 2022-04-30 (×4): qty 1

## 2022-04-30 MED ORDER — LIDOCAINE 2% (20 MG/ML) 5 ML SYRINGE
INTRAMUSCULAR | Status: DC | PRN
  Administered 2022-04-30: 60 mg via INTRAVENOUS

## 2022-04-30 MED ORDER — PHENYLEPHRINE 80 MCG/ML (10ML) SYRINGE FOR IV PUSH (FOR BLOOD PRESSURE SUPPORT)
PREFILLED_SYRINGE | INTRAVENOUS | Status: DC | PRN
  Administered 2022-04-30: 80 ug via INTRAVENOUS
  Administered 2022-04-30: 160 ug via INTRAVENOUS
  Administered 2022-04-30: 240 ug via INTRAVENOUS
  Administered 2022-04-30 (×3): 160 ug via INTRAVENOUS

## 2022-04-30 MED ORDER — PHENYLEPHRINE HCL-NACL 20-0.9 MG/250ML-% IV SOLN
INTRAVENOUS | Status: DC | PRN
  Administered 2022-04-30: 50 ug/min via INTRAVENOUS

## 2022-04-30 MED ORDER — PROPOFOL 10 MG/ML IV BOLUS
INTRAVENOUS | Status: DC | PRN
  Administered 2022-04-30: 30 mg via INTRAVENOUS

## 2022-04-30 MED ORDER — SODIUM CHLORIDE 0.9 % IV SOLN: 250.0000 mL | INTRAVENOUS | Status: DC | PRN

## 2022-04-30 MED ORDER — SERTRALINE HCL 25 MG PO TABS
25.0000 mg | ORAL_TABLET | Freq: Every day | ORAL | Status: DC
  Administered 2022-05-01 – 2022-05-04 (×4): 25 mg via ORAL
  Filled 2022-04-30 (×4): qty 1

## 2022-04-30 MED FILL — Mannitol IV Soln 20%: INTRAVENOUS | Qty: 500 | Status: AC

## 2022-04-30 MED FILL — Lidocaine HCl Local Preservative Free (PF) Inj 2%: INTRAMUSCULAR | Qty: 14 | Status: AC

## 2022-04-30 MED FILL — Electrolyte-R (PH 7.4) Solution: INTRAVENOUS | Qty: 4000 | Status: AC

## 2022-04-30 MED FILL — Sodium Chloride IV Soln 0.9%: INTRAVENOUS | Qty: 2000 | Status: AC

## 2022-04-30 MED FILL — Sodium Bicarbonate IV Soln 8.4%: INTRAVENOUS | Qty: 50 | Status: AC

## 2022-04-30 MED FILL — Heparin Sodium (Porcine) Inj 1000 Unit/ML: INTRAMUSCULAR | Qty: 10 | Status: AC

## 2022-04-30 NOTE — TOC Benefit Eligibility Note (Signed)
Patient Teacher, English as a foreign language completed.    The patient is currently admitted and upon discharge could be taking dofetilide '500mg'$ .  The current 30 day co-pay is $1.19.   The patient is insured through [place] [place] Patient Advocate Specialist [place] Patient Advocate Team Direct Number: [phone] Fax: [phone]

## 2022-04-30 NOTE — Plan of Care (Signed)
  Problem: Activity: Goal: Risk for activity intolerance will decrease Outcome: Progressing   Problem: Clinical Measurements: Goal: Postoperative complications will be avoided or minimized Outcome: Progressing   

## 2022-04-30 NOTE — Progress Notes (Signed)
Patient's b/p 97/52. Per Dr. Darcey Nora, give pt 25 mg metoprolol once. Patient's HR 120's afib. Per verbal order from Dr. Darcey Nora to cycle b/p every hour for 4 hours.   Cardizem given at regular scheduled time.

## 2022-04-30 NOTE — Progress Notes (Addendum)
      [address]       [place] [address]             [phone]      8 Days Post-Op Procedure(s) (LRB): AORTIC VALVE REPLACEMENT (AVR) USING 25 MM INSPIRIS RESILIA  AORTIC VALVE (N/A) CORONARY ARTERY BYPASS GRAFTING (CABG) TIMES ONE USE THE ENDOSCOPICALLY HARVESTED RIGHT GREATER SAPHENOUS VEIN (N/A) TRANSESOPHAGEAL ECHOCARDIOGRAM (TEE) (N/A)  Subjective:  Patient is frustrated over situation.  He wants to go home.  Objective: Vital signs in last 24 hours: Temp:  [97.8 F (36.6 C)-98.5 F (36.9 C)] 98.5 F (36.9 C) (10/19 0325) Pulse Rate:  [99-123] 123 (10/19 0325) Cardiac Rhythm: Atrial fibrillation (10/18 2100) Resp:  [19-29] 19 (10/19 0325) BP: (97-124)/(65-95) 121/81 (10/19 0505) SpO2:  [97 %-100 %] 100 % (10/19 0325) Weight:  [85.4 kg] 85.4 kg (10/19 0520)  Intake/Output from previous day: 10/18 0701 - 10/19 0700 In: 316.8 [P.O.:240; I.V.:76.8] Out: 1175 [Urine:1175]  General appearance: alert, cooperative, and no distress Heart: irregularly irregular rhythm Lungs: clear to auscultation bilaterally Abdomen: soft, non-tender; bowel sounds normal; no masses,  no organomegaly Extremities: edema trace Wound: clean and dry  Lab Results: Recent Labs    04/28/22 0439 04/29/22 0513  WBC 11.2* 11.7*  HGB 8.1* 8.0*  HCT 23.3* 24.6*  PLT 294 338   BMET:  Recent Labs    04/29/22 0513 04/30/22 0102  NA 138 136  K 4.5 4.8  CL 102 102  CO2 26 27  GLUCOSE 106* 117*  BUN 17 19  CREATININE 1.10 1.18  CALCIUM 8.9 8.9    PT/INR:  Recent Labs    04/29/22 2105  LABPROT 16.8*  INR 1.4*   ABG    Component Value Date/Time   PHART 7.365 04/22/2022 2331   HCO3 23.2 04/22/2022 2331   TCO2 29 04/25/2022 0301   ACIDBASEDEF 2.0 04/22/2022 2331   O2SAT 53 04/24/2022 1000   CBG (last 3)  Recent Labs    04/28/22 1130 04/28/22 1623 04/29/22 1129  GLUCAP 114* 130* 103*    Assessment/Plan: S/P Procedure(s) (LRB): AORTIC VALVE  REPLACEMENT (AVR) USING 25 MM INSPIRIS RESILIA  AORTIC VALVE (N/A) CORONARY ARTERY BYPASS GRAFTING (CABG) TIMES ONE USE THE ENDOSCOPICALLY HARVESTED RIGHT GREATER SAPHENOUS VEIN (N/A) TRANSESOPHAGEAL ECHOCARDIOGRAM (TEE) (N/A)  CV- Atrial Fibrillation RVR- on Cardizem, Lopressor, Eliquis- for Cardioversion today, Tikosyn load to start post procedure... EP is following Pulm- no acute issues, continue IS Renal- creatinine remains stable, weight is at baseline, continue Lasix, potassium Expected post operative blood loss anemia stab Dispo- patient for cardioversion today, continue current care, for Tikosyn load after cardioversion today, earliest he would be ready for d/c in Sunday, but likely Monday   LOS: 8 days    Ellwood Handler, PA-C' 04/30/2022 I agree with above

## 2022-04-30 NOTE — Progress Notes (Signed)
Patient has home CPAP setup at bedside within reach.

## 2022-04-30 NOTE — Anesthesia Preprocedure Evaluation (Addendum)
Anesthesia Evaluation  Patient identified by MRN, date of birth, ID band Patient awake    Reviewed: Allergy & Precautions, NPO status , Patient's Chart, lab work & pertinent test results  History of Anesthesia Complications Negative for: history of anesthetic complications  Airway Mallampati: III  TM Distance: >3 FB Neck ROM: Full    Dental  (+) Dental Advisory Given, Teeth Intact   Pulmonary asthma , sleep apnea and Continuous Positive Airway Pressure Ventilation , PE   Pulmonary exam normal        Cardiovascular hypertension, Pt. on medications pulmonary hypertension+ CAD, +CHF and + DVT  + dysrhythmias Atrial Fibrillation + Valvular Problems/Murmurs AI and MR  Rhythm:Irregular Rate:Tachycardia   '23 Cath - Mild pulmonary hypertension secondary to elevated EDP, single-vessel coronary artery disease that is of significance involving the right coronary artery which is moderate-sized vessel.  Mild disease is evident in the LAD and CX.  '23 TTE - EF 46%. Left ventricle cavity is moderate to severely dilated. Moderate concentric hypertrophy of the  left ventricle. Hypokinetic global wall motion. Left atrial cavity is moderately dilated at 4.2 cm. Moderate to severe aortic regurgitation. Mild to moderate mitral regurgitation. Moderate tricuspid regurgitation. Moderate to severe pulmonary hypertension. RVSP measures 60 mmHg.  Dilated pulmonary artery.     Neuro/Psych Seizures -, Well Controlled,  PSYCHIATRIC DISORDERS Anxiety  Cerebral mass s/p craniotomy      GI/Hepatic Neg liver ROS, GERD  Medicated and Controlled,  Endo/Other  Hypothyroidism  Obesity   Renal/GU      Musculoskeletal  (+) Arthritis ,   Abdominal Normal abdominal exam  (+)   Peds  Hematology  (+) Blood dyscrasia, anemia ,   Anesthesia Other Findings   Reproductive/Obstetrics                            Anesthesia  Physical  Anesthesia Plan  ASA: 3  Anesthesia Plan: General   Post-op Pain Management:    Induction:   PONV Risk Score and Plan: 2 and Treatment may vary due to age or medical condition, Propofol infusion and TIVA  Airway Management Planned: Natural Airway and Mask  Additional Equipment: TEE  Intra-op Plan:   Post-operative Plan:   Informed Consent: I have reviewed the patients History and Physical, chart, labs and discussed the procedure including the risks, benefits and alternatives for the proposed anesthesia with the patient or authorized representative who has indicated his/her understanding and acceptance.     Dental advisory given  Plan Discussed with: CRNA  Anesthesia Plan Comments:         Anesthesia Quick Evaluation

## 2022-04-30 NOTE — Telephone Encounter (Signed)
Pharmacy Patient Advocate Encounter  Insurance verification completed.    The patient is insured through [employer]   The patient is currently admitted and ran test claims for the following: Dofetilide '500mg'$ .  Copays and coinsurance results were relayed to Inpatient clinical team.

## 2022-04-30 NOTE — Progress Notes (Addendum)
Electrophysiology Rounding Note  Patient Name: [name] Date of Encounter: 04/30/2022  Primary Cardiologist: None Electrophysiologist: New to Dr. Lovena Le   Subjective   Feeling OK at rest, but feels "stuck in bed" and anxious to go home. HRs up into 140-150s again with activity overnight  Inpatient Medications    Scheduled Meds:  apixaban  5 mg Oral BID   aspirin EC  81 mg Oral Daily   bisacodyl  10 mg Oral Daily   Or   bisacodyl  10 mg Rectal Daily   Chlorhexidine Gluconate Cloth  6 each Topical Daily   dapagliflozin propanediol  10 mg Oral Daily   diltiazem  90 mg Oral Q6H   docusate sodium  200 mg Oral Daily   escitalopram  10 mg Oral Daily   ferrous sulfate  325 mg Oral BID PC   fluticasone  1 spray Each Nare Daily   furosemide  40 mg Oral BID   levETIRAcetam  500 mg Oral BID   levothyroxine  100 mcg Oral Q T,Th,S,Su-1800   levothyroxine  50 mcg Oral Q M,W,F   loratadine  10 mg Oral Daily   metoprolol tartrate  50 mg Oral TID   pantoprazole  40 mg Oral Daily   polyethylene glycol  17 g Oral Daily   potassium chloride  40 mEq Oral BID   rosuvastatin  40 mg Oral Daily   sodium chloride flush  3 mL Intravenous Q12H   tamsulosin  0.4 mg Oral Daily   Continuous Infusions:  sodium chloride     sodium chloride 20 mL/hr at 04/29/22 2259   PRN Meds: sodium chloride, azelastine, dextrose, guaiFENesin-dextromethorphan, metoprolol tartrate, sodium chloride flush, traMADol   Vital Signs    Vitals:   04/30/22 0034 04/30/22 0325 04/30/22 0505 04/30/22 0520  BP: 107/78 115/85 121/81   Pulse: (!) 115 (!) 123    Resp: 20 19    Temp: 98.5 F (36.9 C) 98.5 F (36.9 C)    TempSrc: Oral     SpO2: 100% 100%    Weight:    85.4 kg  Height:        Intake/Output Summary (Last 24 hours) at 04/30/2022 0721 Last data filed at 04/30/2022 0250 Gross per 24 hour  Intake 316.83 ml  Output 1175 ml  Net -858.17 ml   Filed Weights   04/28/22 0500 04/29/22 0423  04/30/22 0520  Weight: 89.6 kg 88.6 kg 85.4 kg    Physical Exam    GEN- The patient is well appearing, alert and oriented x 3 today.   Head- normocephalic, atraumatic Eyes-  Sclera clear, conjunctiva pink Ears- hearing intact Oropharynx- clear Neck- supple Lungs- Clear to ausculation bilaterally, normal work of breathing Heart- Irregularly irregular rate and rhythm, no murmurs, rubs or gallops GI- soft, NT, ND, + BS Extremities- no clubbing or cyanosis. No edema Skin- no rash or lesion Psych- euthymic mood, full affect Neuro- strength and sensation are intact  Labs    CBC Recent Labs    04/28/22 0439 04/29/22 0513  WBC 11.2* 11.7*  HGB 8.1* 8.0*  HCT 23.3* 24.6*  MCV 92.1 94.6  PLT 294 423   Basic Metabolic Panel Recent Labs    04/29/22 0513 04/30/22 0102  NA 138 136  K 4.5 4.8  CL 102 102  CO2 26 27  GLUCOSE 106* 117*  BUN 17 19  CREATININE 1.10 1.18  CALCIUM 8.9 8.9  MG 2.0 2.1   Liver Function Tests No results  for input(s): "AST", "ALT", "ALKPHOS", "BILITOT", "PROT", "ALBUMIN" in the last 72 hours. No results for input(s): "LIPASE", "AMYLASE" in the last 72 hours. Cardiac Enzymes No results for input(s): "CKTOTAL", "CKMB", "CKMBINDEX", "TROPONINI" in the last 72 hours.   Telemetry    AF with variable rates, mostly > 110, as high as 150s (personally reviewed)  Radiology    No results found.  Patient Profile     [name] is a 66 y.o. 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 Lavonna Monarch.  Assessment & Plan    1.  AF with RVR, post op Rates remain overall poorly controlled.  Continue lopressor 50 mg TID  Continue diltiazem 90 q 6 hrs.  TEE intra op with EF 50-55% Plan for TEE/DCC this afternoon, followed by  initiation of Tikosyn.  With iodine allergy amiodarone is felt to be contraindicated.    2. Aortic regurg s/p AV replacement 10/11 3. CAD s/p CABG x 1 10/11 Overall stable post op course apart from AF as above which has been difficulty to control given Iodine allergy.   Plan for TEE/DCC today then Tikosyn initiation tonight. The soonest he would be clear to go home from [place] would be Sunday after morning dose. Will need to make sure Pharmacy of choice has in stock, as [facility] or [facility] are closed on Sunday.     For questions or updates, please contact [place] Please consult [email or web address] for contact info under Cardiology/STEMI.  Signed, Shirley Friar, PA-C  04/30/2022, 7:21 AM   EP Attending  He is feeling better. His rates are under a little better control. He will undergo TEE guided DCCV later today and initiation of dofetilide. His exam reveals an IRIR tachy with minimal basilar rales. Ext with no edema.   Carleene Overlie [place] Lasseter,MD

## 2022-04-30 NOTE — CV Procedure (Signed)
   Transesophageal Echocardiogram  Indications:AFIB  Time out performed  During this procedure the patient was administered propofol under anesthesiology supervision to achieve and maintain moderate sedation.  The patient's heart rate, blood pressure, and oxygen saturation are monitored continuously during the procedure.   Findings:  Left Ventricle: Ejection fraction 60%  Mitral Valve: Trace mitral regurgitation  Aortic Valve: Bioprosthetic trileaflet aortic valve normal.  Well-seated.  No regurgitation.  Tricuspid Valve: Mild TR  Left Atrium: Normal, no left atrial appendage thrombus  Right Atrium: Normal  Intraatrial septum: Normal  Bubble Contrast Study: Not performed  Candee Furbish, MD      Electrical Cardioversion Procedure Note ROLF FELLS [id number] 1956/06/29  Procedure: Electrical Cardioversion Indications:  Atrial Fibrillation  Time Out: Verified patient identification, verified procedure,medications/allergies/relevent history reviewed, required imaging and test results available.  Performed  Procedure Details  The patient was NPO after midnight. Anesthesia was administered at the beside.  Cardioversion was performed with synchronized biphasic defibrillation via AP pads with 200 joules.  1 attempt(s) were performed.  The patient converted to normal sinus rhythm. The patient tolerated the procedure well   IMPRESSION:  Successful cardioversion of atrial fibrillation    Candee Furbish 04/30/2022, 1:29 PM

## 2022-04-30 NOTE — Transfer of Care (Signed)
Immediate Anesthesia Transfer of Care Note  Patient: [name]  Procedure(s) Performed: TRANSESOPHAGEAL ECHOCARDIOGRAM (TEE) CARDIOVERSION  Patient Location: Endoscopy Unit  Anesthesia Type:MAC  Level of Consciousness: awake and drowsy  Airway & Oxygen Therapy: Patient Spontanous Breathing and Patient connected to nasal cannula oxygen  Post-op Assessment: Report given to RN and Post -op Vital signs reviewed and stable  Post vital signs: Reviewed and stable  Last Vitals:  Vitals Value Taken Time  BP 98/72 04/30/22 1326  Temp 36.7 C 04/30/22 1321  Pulse 92 04/30/22 1328  Resp 27 04/30/22 1328  SpO2 100 % 04/30/22 1328  Vitals shown include unvalidated device data.  Last Pain:  Vitals:   04/30/22 1321  TempSrc: Temporal  PainSc: Asleep      Patients Stated Pain Goal: 0 (96/28/36 6294)  Complications: No notable events documented.

## 2022-04-30 NOTE — Interval H&P Note (Signed)
History and Physical Interval Note:  04/30/2022 12:14 PM  [name]  has presented today for surgery, with the diagnosis of afib.  The various methods of treatment have been discussed with the patient and family. After consideration of risks, benefits and other options for treatment, the patient has consented to  Procedure(s): TRANSESOPHAGEAL ECHOCARDIOGRAM (TEE) (N/A) CARDIOVERSION (N/A) as a surgical intervention.  The patient's history has been reviewed, patient examined, no change in status, stable for surgery.  I have reviewed the patient's chart and labs.  Questions were answered to the patient's satisfaction.     [employer]

## 2022-04-30 NOTE — Progress Notes (Signed)
Pharmacy: Dofetilide (Tikosyn) - Initial Consult Assessment and Electrolyte Replacement  Pharmacy consulted to assist in monitoring and replacing electrolytes in this 66 y.o. male admitted on 04/22/2022 undergoing dofetilide initiation. First dofetilide dose: 04/30/22.   Assessment:  Patient Exclusion Criteria: If any screening criteria checked as "Yes", then  patient  should NOT receive dofetilide until criteria item is corrected.  If "Yes" please indicate correction plan.  YES  NO Patient  Exclusion Criteria Correction Plan   '[]'$   '[x]'$   Baseline QTc interval is greater than or equal to 440 msec. IF above YES box checked dofetilide contraindicated unless patient has ICD; then may proceed if QTc 500-550 msec or with known ventricular conduction abnormalities may proceed with QTc 550-600 msec. QTc = 0.45    '[]'$   '[x]'$   Patient is known or suspected to have a digoxin level greater than 2 ng/ml: No results found for: "DIGOXIN"     '[]'$   '[x]'$   Creatinine clearance less than 20 ml/min (calculated using Cockcroft-Gault, actual body weight and serum creatinine): Estimated Creatinine Clearance: 65.1 mL/min (by C-G formula based on SCr of 1.18 mg/dL).     '[x]'$   '[]'$  Patient has received drugs known to prolong the QT intervals within the last 48 hours (phenothiazines, tricyclics or tetracyclic antidepressants, erythromycin, H-1 antihistamines, cisapride, fluoroquinolones, azithromycin, ondansetron).   Updated information on QT prolonging agents is available to be searched on the following database:QT prolonging agents  Escitalopram prior to admit, will switch to sertraline which does not carry risk of qtc prolongation   '[]'$   '[x]'$   Patient received a dose of hydrochlorothiazide (Oretic) alone or in any combination including triamterene (Dyazide, Maxzide) in the last 48 hours.    '[]'$   '[x]'$  Patient received a medication known to increase dofetilide plasma concentrations prior to initial dofetilide dose:   Trimethoprim (Primsol, Proloprim) in the last 36 hours Verapamil (Calan, Verelan) in the last 36 hours or a sustained release dose in the last 72 hours Megestrol (Megace) in the last 5 days  Cimetidine (Tagamet) in the last 6 hours Ketoconazole (Nizoral) in the last 24 hours Itraconazole (Sporanox) in the last 48 hours  Prochlorperazine (Compazine) in the last 36 hours     '[]'$   '[x]'$   Patient is known to have a history of torsades de pointes; congenital or acquired long QT syndromes.    '[]'$   '[x]'$   Patient has received a Class 1 antiarrhythmic with less than 2 half-lives since last dose. (Disopyramide, Quinidine, Procainamide, Lidocaine, Mexiletine, Flecainide, Propafenone)    '[]'$   '[x]'$   Patient has received amiodarone therapy in the past 3 months or amiodarone level is greater than 0.3 ng/ml.    Labs:    Component Value Date/Time   K 4.8 04/30/2022 0102   MG 2.1 04/30/2022 0102     Plan: [facility] One Calculated CrCl  Dose q12h  '[x]'$  > 60 ml/min 500 mcg  '[]'$  40-60 ml/min 250 mcg  '[]'$  20-40 ml/min 125 mcg   '[x]'$   Physician selected initial dose within range recommended for patients level of renal function - will monitor for response.  '[]'$   Physician selected initial dose outside of range recommended for patients level of renal function - will discuss if the dose should be altered at this time.   Patient has been appropriately anticoagulated with apixaban.  Potassium: K >/= 4: Appropriate to initiate Tikosyn, no replacement needed    Magnesium: Mg >2: Appropriate to initiate Tikosyn, no replacement needed     Thank you for allowing  pharmacy to participate in this patient's care   Erin Hearing PharmD., BCPS Clinical Pharmacist 04/30/2022 3:26 PM

## 2022-04-30 NOTE — Anesthesia Procedure Notes (Signed)
Procedure Name: MAC Date/Time: 04/30/2022 12:49 PM  Performed by: Dorann Lodge, CRNAPre-anesthesia Checklist: Patient identified, Emergency Drugs available, Patient being monitored and Suction available Patient Re-evaluated:Patient Re-evaluated prior to induction Oxygen Delivery Method: Nasal cannula Airway Equipment and Method: Bite block Dental Injury: Teeth and Oropharynx as per pre-operative assessment

## 2022-05-01 LAB — HIV ANTIBODY (ROUTINE TESTING W REFLEX): HIV Screen 4th Generation wRfx: NONREACTIVE

## 2022-05-01 LAB — BASIC METABOLIC PANEL
Anion gap: 8 (ref 5–15)
BUN: 22 mg/dL (ref 8–23)
CO2: 26 mmol/L (ref 22–32)
Calcium: 8.9 mg/dL (ref 8.9–10.3)
Chloride: 103 mmol/L (ref 98–111)
Creatinine, Ser: 1.27 mg/dL — ABNORMAL HIGH (ref 0.61–1.24)
GFR, Estimated: >60 mL/min (ref >60)
Glucose, Bld: 124 mg/dL — ABNORMAL HIGH (ref 70–99)
Potassium: 4.7 mmol/L (ref 3.5–5.1)
Sodium: 137 mmol/L (ref 135–145)

## 2022-05-01 LAB — MAGNESIUM: Magnesium: 1.9 mg/dL (ref 1.7–2.4)

## 2022-05-01 MED ORDER — MAGNESIUM SULFATE 2 GM/50ML IV SOLN
2.0000 g | Freq: Once | INTRAVENOUS | Status: AC
  Administered 2022-05-01: 2 g via INTRAVENOUS
  Filled 2022-05-01: qty 50

## 2022-05-01 MED ORDER — DOFETILIDE 250 MCG PO CAPS
250.0000 ug | ORAL_CAPSULE | Freq: Two times a day (BID) | ORAL | Status: DC
  Administered 2022-05-01 – 2022-05-04 (×6): 250 ug via ORAL
  Filled 2022-05-01 (×8): qty 1

## 2022-05-01 MED ORDER — ACETAMINOPHEN 500 MG PO TABS
1000.0000 mg | ORAL_TABLET | Freq: Four times a day (QID) | ORAL | Status: DC | PRN
  Administered 2022-05-01 – 2022-05-04 (×8): 1000 mg via ORAL
  Filled 2022-05-01 (×8): qty 2

## 2022-05-01 NOTE — Progress Notes (Signed)
Pharmacy: Dofetilide (Tikosyn) - Follow Up Assessment and Electrolyte Replacement  Pharmacy consulted to assist in monitoring and replacing electrolytes in this 66 y.o. male admitted on 04/22/2022 s/p CABGx1+ bAVR with post op Afib undergoing dofetilide initiation. First dofetilide dose 562mg q12h 10/19   Labs:    Component Value Date/Time   K 4.7 05/01/2022 0107   MG 1.9 05/01/2022 0107     Plan: Potassium: K >/= 4: No additional supplementation needed  Magnesium: Mg 1.8-2: Give Mg 2 gm IV x1     LBonnita NasutiPharm.D. CPP, BCPS Clinical Pharmacist [phone]/20/2023 11:47 AM

## 2022-05-01 NOTE — Progress Notes (Signed)
CARDIAC REHAB PHASE I   PRE:  Rate/Rhythm: 77 NSR  BP:  Sitting: 118/80      SaO2: 93 RA  MODE:  Ambulation: 360 ft   POST:  Rate/Rhythm: 96 NSR  BP:  Sitting: 122/79      SaO2: 92 RA  Pt was received from bed following sternal precautions and ambulated through the hallway with RW using standby assistance. Pt walked well, c/p of SOB 7/10 w/o dizziness that resolved with rest, and pt took two rest breaks. Pt was educated on sternal precautions, restrictions, heart healthy diet, ex guidelines, IS use, and CRPII. Pt will be referred to [facility] at [facility].  [phone]  Christen Bame  10:14 AM 05/01/2022

## 2022-05-01 NOTE — Progress Notes (Addendum)
Post dose EKG is reviewed with Dr. Lovena Le. He has had QT lengthening and will need to reduce his dose. Plan for 299mg this evening.  RTommye Standard PA-C    I have reached out to the [facility] for the patient. They have both 254m and 12598mdoses in stock. Please note that pharmacy has made recommendation to stop his Lexapro and consider alternative that will not cause/contribute to QT prolongation  AFib clinic and EP office follow up is in place,   RenTommye StandardA-C

## 2022-05-01 NOTE — Progress Notes (Addendum)
      [address]       [place] [address]             [phone]      1 Day Post-Op Procedure(s) (LRB): TRANSESOPHAGEAL ECHOCARDIOGRAM (TEE) (N/A) CARDIOVERSION (N/A)  Subjective:  Patient feeling better after cardioversion.  He is no longer experiencing cold symptoms.  + ambulation  + BM  Objective: Vital signs in last 24 hours: Temp:  [97.4 F (36.3 C)-98.5 F (36.9 C)] 98.2 F (36.8 C) (10/20 0528) Pulse Rate:  [86-149] 89 (10/20 0528) Cardiac Rhythm: Normal sinus rhythm (10/19 2100) Resp:  [16-26] 18 (10/20 0528) BP: (87-135)/(51-97) 111/72 (10/20 0528) SpO2:  [91 %-100 %] 93 % (10/20 0528) Weight:  [85.4 kg] 85.4 kg (10/20 0528)  Intake/Output from previous day: 10/19 0701 - 10/20 0700 In: 400 [I.V.:400] Out: 1050 [Urine:1050] Intake/Output this shift:  General appearance: alert, cooperative, and no distress Heart: regular rate and rhythm Lungs: clear to auscultation bilaterally Abdomen: soft, non-tender; bowel sounds normal; no masses,  no organomegaly Extremities: edema trace Wound: clean and dry  Lab Results: Recent Labs    04/29/22 0513  WBC 11.7*  HGB 8.0*  HCT 24.6*  PLT 338   BMET:  Recent Labs    04/30/22 0102 05/01/22 0107  NA 136 137  K 4.8 4.7  CL 102 103  CO2 27 26  GLUCOSE 117* 124*  BUN 19 22  CREATININE 1.18 1.27*  CALCIUM 8.9 8.9    PT/INR:  Recent Labs    04/29/22 2105  LABPROT 16.8*  INR 1.4*   ABG    Component Value Date/Time   PHART 7.365 04/22/2022 2331   HCO3 23.2 04/22/2022 2331   TCO2 29 04/25/2022 0301   ACIDBASEDEF 2.0 04/22/2022 2331   O2SAT 53 04/24/2022 1000   CBG (last 3)  Recent Labs    04/28/22 1130 04/28/22 1623 04/29/22 1129  GLUCAP 114* 130* 103*    Assessment/Plan: S/P Procedure(s) (LRB): TRANSESOPHAGEAL ECHOCARDIOGRAM (TEE) (N/A) CARDIOVERSION (N/A)  CV- PAF, NSR- currently getting Tikosyn load, Cardizem, Lopressor, continue Eliquis-- per EP Pulm- off oxygen,  continue IS Renal- creatinine relatively stable, continue lasix 40 mg BID Dispo- patient cardioverted yesterday, undergoing Tikosyn load, continue current care, hopefully ready for d/C S/M depending rhythm   LOS: 9 days   Ellwood Handler, PA-C 05/01/2022  Agree with above.  Hopefuly home soon

## 2022-05-01 NOTE — Plan of Care (Signed)
  Problem: Respiratory: Goal: Respiratory status will improve Outcome: Progressing   Problem: Skin Integrity: Goal: Wound healing without signs and symptoms of infection Outcome: Progressing Goal: Risk for impaired skin integrity will decrease Outcome: Progressing

## 2022-05-01 NOTE — TOC Initial Note (Signed)
Transition of Care (TOC) - Initial/Assessment Note  Marvetta Gibbons RN, BSN Transitions of Care Unit 4E- RN Case Manager See Treatment Team for direct phone #    Patient Details  Name: [name] MRN: [id number] Date of Birth: 1955/12/09  Transition of Care [facility]) CM/SW Contact:    Dawayne Patricia, RN Phone Number: 05/01/2022, 2:17 PM  Clinical Narrative:                 Pt admitted from home s/p AVR/CABG with post op afib, with TEE cardioversion and now Tikosyn initiation. Consult received for Tikosyn needs.  Per benefits check- copay $1.19- currently.   CM spoke with pt and daughters at bedside- discussed Tikosyn benefits and potential for increased copay at first of year- pt voiced understanding.  Per pt he uses [facility]- [facility] [address] [facility]   Call made to [employer] to check on Tikosyn 213mg - per pharmacy they do have generic- Dofetilide 250 mcg in stock to fill over the weekend should pt discharge on Sunday[place] [place] [place]  [facility]  Expected Discharge Plan: Home/Self Care Barriers to Discharge: Continued Medical Work up   Patient Goals and CMS Choice Patient states their goals for this hospitalization and ongoing recovery are:: return home   Choice offered to / list presented to : NA  Expected Discharge Plan and Services Expected Discharge Plan: Home/Self Care   Discharge Planning Services: CM Consult, Medication Assistance   Living arrangements for the past 2 months: Single Family Home                                      Prior Living Arrangements/Services Living arrangements for the past 2 months: Single Family Home Lives with:: Spouse Patient language and need for interpreter reviewed:: Yes        Need for Family Participation in Patient Care: Yes (Comment) Care giver support system in place?: Yes (comment) Current home services: DME Criminal  Activity/Legal Involvement Pertinent to Current Situation/Hospitalization: No - Comment as needed  Activities of Daily Living Home Assistive Devices/Equipment: None ADL Screening (condition at time of admission) Patient's cognitive ability adequate to safely complete daily activities?: Yes Is the patient deaf or have difficulty hearing?: No Does the patient have difficulty seeing, even when wearing glasses/contacts?: No Does the patient have difficulty concentrating, remembering, or making decisions?: No Patient able to express need for assistance with ADLs?: Yes Does the patient have difficulty dressing or bathing?: No Independently performs ADLs?: Yes (appropriate for developmental age) Does the patient have difficulty walking or climbing stairs?: No Weakness of Legs: None Weakness of Arms/Hands: None  Permission Sought/Granted                  Emotional Assessment Appearance:: Appears stated age Attitude/Demeanor/Rapport: Engaged Affect (typically observed): Accepting, Appropriate Orientation: : Oriented to Self, Oriented to Place, Oriented to  Time, Oriented to Situation Alcohol / Substance Use: Not Applicable Psych Involvement: No (comment)  Admission diagnosis:  S/P AVR [Z95.2] Patient Active Problem List   Diagnosis Date Noted   Atrial fibrillation ([place] 04/30/2022   History of cardioversion 04/30/2022   S/P AVR 04/22/2022   S/P CABG x 1    ABLA (acute blood loss anemia)    On mechanically assisted ventilation (HCC)    Nonrheumatic aortic valve  insufficiency    Non-ischemic cardiomyopathy ([place])    Coronary artery disease involving native coronary artery of native heart without angina pectoris    Seasonal and perennial allergic rhinitis 05/25/2016   Asthmatic bronchitis 05/22/2015   GERD (gastroesophageal reflux disease) 01/28/2014   Other pulmonary embolism and infarction 01/28/2014   Cerebral mass 04/01/2012   S/P appendectomy 12/08/2011   Obstructive sleep  apnea 01/28/2007   HYPERTENSION, BENIGN ESSENTIAL 01/28/2007   KIDNEY DISEASE, CHRONIC, STAGE III 01/28/2007   PCP:  Cari Caraway, MD Pharmacy:   [place], [place] [address] Phone: [phone] Fax: [phone]  [facility] ([place], [facility] [address] [place] [address] Phone: [phone] Fax: [phone]  [place], [place] [address] Phone: [phone] Fax: [phone]  [facility] [address] [place] [address] Phone: [phone] Fax: [phone]     Social Determinants of Health (SDOH) Interventions Housing Interventions: Intervention Not Indicated  Readmission Risk Interventions     No data to display

## 2022-05-01 NOTE — Progress Notes (Cosign Needed Addendum)
Rounding Note    Patient Name: [name] Date of Encounter: 05/01/2022  [place] Cardiologist: None Dr. Einar Gip  Subjective   Feels well, denies CP, SOB  Inpatient Medications    Scheduled Meds:  apixaban  5 mg Oral BID   aspirin EC  81 mg Oral Daily   bisacodyl  10 mg Oral Daily   Or   bisacodyl  10 mg Rectal Daily   Chlorhexidine Gluconate Cloth  6 each Topical Daily   dapagliflozin propanediol  10 mg Oral Daily   diltiazem  90 mg Oral Q6H   docusate sodium  200 mg Oral Daily   dofetilide  500 mcg Oral BID   ferrous sulfate  325 mg Oral BID PC   fluticasone  1 spray Each Nare Daily   furosemide  40 mg Oral BID   levETIRAcetam  500 mg Oral BID   levothyroxine  100 mcg Oral Q T,Th,S,Su-1800   levothyroxine  50 mcg Oral Q M,W,F   loratadine  10 mg Oral Daily   metoprolol tartrate  50 mg Oral TID   pantoprazole  40 mg Oral Daily   polyethylene glycol  17 g Oral Daily   potassium chloride  40 mEq Oral BID   rosuvastatin  40 mg Oral Daily   sertraline  25 mg Oral Daily   sodium chloride flush  3 mL Intravenous Q12H   sodium chloride flush  3 mL Intravenous Q12H   tamsulosin  0.4 mg Oral Daily   Continuous Infusions:  sodium chloride     sodium chloride     PRN Meds: sodium chloride, sodium chloride, azelastine, dextrose, guaiFENesin-dextromethorphan, metoprolol tartrate, sodium chloride flush, sodium chloride flush, traMADol   Vital Signs    Vitals:   04/30/22 1604 04/30/22 2114 05/01/22 0024 05/01/22 0528  BP: (!) 127/90 (!) 135/94 120/87 111/72  Pulse: 89 100 87 89  Resp: '20  18 18  '$ Temp: 98.5 F (36.9 C) 98.5 F (36.9 C) 98.5 F (36.9 C) 98.2 F (36.8 C)  TempSrc: Oral Oral Oral Oral  SpO2: 100% 99% 100% 93%  Weight:    85.4 kg  Height:        Intake/Output Summary (Last 24 hours) at 05/01/2022 0741 Last data filed at 05/01/2022 0155 Gross per 24 hour  Intake 400 ml  Output 1050 ml  Net -650 ml      05/01/2022    5:28  AM 04/30/2022    5:20 AM 04/29/2022    4:23 AM  Last 3 Weights  Weight (lbs) 188 lb 4.4 oz 188 lb 4.8 oz 195 lb 5.2 oz  Weight (kg) 85.4 kg 85.412 kg 88.6 kg      Telemetry    SR 90's since DCCV - Personally Reviewed  ECG    This AM is SR 91bpm, QTc 465m - Personally Reviewed with Dr. TLovena Le Physical Exam   GEN: No acute distress.   Neck: No JVD Cardiac: RRR, no murmurs, rubs, or gallops.  Respiratory: CTA b/l. GI: Soft, nontender, non-distended  MS: No edema; No deformity. Neuro:  Nonfocal  Psych: Normal affect   Labs    High Sensitivity Troponin:  No results for input(s): "TROPONINIHS" in the last 720 hours.   Chemistry Recent Labs  Lab 04/29/22 0513 04/30/22 0102 05/01/22 0107  NA 138 136 137  K 4.5 4.8 4.7  CL 102 102 103  CO2 '26 27 26  '$ GLUCOSE 106* 117* 124*  BUN 17 19 22  CREATININE 1.10 1.18 1.27*  CALCIUM 8.9 8.9 8.9  MG 2.0 2.1 1.9  GFRNONAA >60 >60 >60  ANIONGAP '10 7 8    '$ Lipids No results for input(s): "CHOL", "TRIG", "HDL", "LABVLDL", "LDLCALC", "CHOLHDL" in the last 168 hours.  Hematology Recent Labs  Lab 04/27/22 0301 04/28/22 0439 04/29/22 0513  WBC 10.0 11.2* 11.7*  RBC 2.43* 2.53* 2.60*  HGB 7.6* 8.1* 8.0*  HCT 22.6* 23.3* 24.6*  MCV 93.0 92.1 94.6  MCH 31.3 32.0 30.8  MCHC 33.6 34.8 32.5  RDW 13.7 13.8 14.1  PLT 234 294 338   Thyroid  Recent Labs  Lab 04/27/22 1925  TSH 7.440*  FREET4 0.92    BNPNo results for input(s): "BNP", "PROBNP" in the last 168 hours.  DDimer No results for input(s): "DDIMER" in the last 168 hours.   Radiology      Cardiac Studies   04/30/22 : TEE Findings: Left Ventricle: Ejection fraction 60% Mitral Valve: Trace mitral regurgitation Aortic Valve: Bioprosthetic trileaflet aortic valve normal.  Well-seated.  No regurgitation. Tricuspid Valve: Mild TR Left Atrium: Normal, no left atrial appendage thrombus Right Atrium: Normal Intraatrial septum: Normal Bubble Contrast Study: Not  performed  Patient Profile     66 y.o. 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 and possible paradoxical embolus following dental work up, and ASD s/p septal occluder 01/2006, h/o DVT/PE  Admitted and underwent S/p AVR, CABG 04/22/22  Post op developed AFib w/RVR, given his hx of anaphylactic reaction to Iodinated Contrast Media and pharmacy recommended against use of amiodarone   EP consulted and planned for Tikosyn S/p TEE/DCCV yesterday 1st dose of drug last night  Assessment & Plan    AFib, new post op CHA2DS2Vasc is 5, on Eliquis, appropriately dosed Tikosyn load is in progress No post dose EKG done last night, this AM EKG with stable QTc, d/w RN for 2 hour post dose EKGs K+ 4.7 Mag 1.9 Creat 1.27, CrCl remains ok for 538mg dosing) QTc stable  Anticipate Sunday completion of load, will have to find pharmacy with drug in stock   VHD CAD S/p CABG/AVR C/w CTS team  For questions or updates, please contact C[place]Please consult [email or web address] for contact info under   Signed, RBaldwin Jamaica PA-C  05/01/2022, 7:41 AM    EP Attending  Patient seen and examined. Agree with above. The patient remains stable. Hopefully home on Sunday after dofetilide load. He is maintaining NSR.  GCarleene OverlieTaylor,MD

## 2022-05-01 NOTE — Progress Notes (Signed)
Called pharmacy to confirm not an issue to give diltiazem and tikosyn. Ok'd per pharmacy. Payton Emerald, RN

## 2022-05-01 NOTE — Progress Notes (Signed)
EKG performed and placed in chart. Pt NSR 79 and QT/Qtc 464/532. Will page PA Renee and make aware. Payton Emerald, RN

## 2022-05-01 NOTE — Progress Notes (Addendum)
Post dose EKG is reviewed with Dr. Lovena Le. He has had QT lengthening and will need to reduce his dose. Plan for 274mg this evening.  RTommye Standard PA-C    I have reached out to the [facility] for the patient. They have both 2530m and 12563mdoses in stock. Please note that pharmacy has made recommendation to stop his Lexapro and consider alternative that will not cause/contribute to QT prolongation  RenTommye StandardA-C

## 2022-05-02 LAB — BASIC METABOLIC PANEL
Anion gap: 8 (ref 5–15)
BUN: 20 mg/dL (ref 8–23)
CO2: 24 mmol/L (ref 22–32)
Calcium: 8.4 mg/dL — ABNORMAL LOW (ref 8.9–10.3)
Chloride: 103 mmol/L (ref 98–111)
Creatinine, Ser: 1.3 mg/dL — ABNORMAL HIGH (ref 0.61–1.24)
GFR, Estimated: >60 mL/min (ref >60)
Glucose, Bld: 129 mg/dL — ABNORMAL HIGH (ref 70–99)
Potassium: 4 mmol/L (ref 3.5–5.1)
Sodium: 135 mmol/L (ref 135–145)

## 2022-05-02 LAB — MAGNESIUM: Magnesium: 2.2 mg/dL (ref 1.7–2.4)

## 2022-05-02 NOTE — Progress Notes (Signed)
Rounding Note    Patient Name: [name] Date of Encounter: 05/02/2022  [place] Cardiologist: None Dr. Einar Gip  Subjective   Feeling well without chest pain or shortness of breath.  Did have a short episode of atrial fibrillation this morning.  Inpatient Medications    Scheduled Meds:  apixaban  5 mg Oral BID   aspirin EC  81 mg Oral Daily   bisacodyl  10 mg Oral Daily   Or   bisacodyl  10 mg Rectal Daily   dapagliflozin propanediol  10 mg Oral Daily   diltiazem  90 mg Oral Q6H   docusate sodium  200 mg Oral Daily   dofetilide  250 mcg Oral BID   ferrous sulfate  325 mg Oral BID PC   fluticasone  1 spray Each Nare Daily   furosemide  40 mg Oral BID   levETIRAcetam  500 mg Oral BID   levothyroxine  100 mcg Oral Q T,Th,S,Su-1800   levothyroxine  50 mcg Oral Q M,W,F   loratadine  10 mg Oral Daily   metoprolol tartrate  50 mg Oral TID   pantoprazole  40 mg Oral Daily   polyethylene glycol  17 g Oral Daily   potassium chloride  40 mEq Oral BID   rosuvastatin  40 mg Oral Daily   sertraline  25 mg Oral Daily   sodium chloride flush  3 mL Intravenous Q12H   sodium chloride flush  3 mL Intravenous Q12H   tamsulosin  0.4 mg Oral Daily   Continuous Infusions:  sodium chloride     sodium chloride     PRN Meds: sodium chloride, sodium chloride, acetaminophen, azelastine, dextrose, guaiFENesin-dextromethorphan, metoprolol tartrate, sodium chloride flush, sodium chloride flush, traMADol   Vital Signs    Vitals:   05/01/22 2032 05/01/22 2307 05/02/22 0352 05/02/22 0825  BP: 113/81 116/78 122/80 126/81  Pulse:   79 82  Resp: '20 20 20 18  '$ Temp: 97.7 F (36.5 C) 98 F (36.7 C) 97.6 F (36.4 C) 98 F (36.7 C)  TempSrc: Oral Oral Oral Oral  SpO2: 98% 99% 97% 98%  Weight:   84.7 kg   Height:       No intake or output data in the 24 hours ending 05/02/22 0924     05/02/2022    3:52 AM 05/01/2022    5:28 AM 04/30/2022    5:20 AM  Last 3 Weights   Weight (lbs) 186 lb 11.2 oz 188 lb 4.4 oz 188 lb 4.8 oz  Weight (kg) 84.687 kg 85.4 kg 85.412 kg      Telemetry    Sinus rhythm with short atrial fibrillation  ECG    Sinus rhythm, QTc 491 ms  Physical Exam   GEN: Well nourished, well developed, in no acute distress  HEENT: normal  Neck: no JVD, carotid bruits, or masses Cardiac: RRR; no murmurs, rubs, or gallops,no edema  Respiratory:  clear to auscultation bilaterally, normal work of breathing GI: soft, nontender, nondistended, + BS MS: no deformity or atrophy  Skin: warm and dry, median sternotomy site well-healed Neuro:  Strength and sensation are intact Psych: euthymic mood, full affect   Labs    High Sensitivity Troponin:  No results for input(s): "TROPONINIHS" in the last 720 hours.   Chemistry Recent Labs  Lab 04/30/22 0102 05/01/22 0107 05/02/22 0140  NA 136 137 135  K 4.8 4.7 4.0  CL 102 103 103  CO2 '27 26 24  '$ GLUCOSE  117* 124* 129*  BUN '19 22 20  '$ CREATININE 1.18 1.27* 1.30*  CALCIUM 8.9 8.9 8.4*  MG 2.1 1.9 2.2  GFRNONAA >60 >60 >60  ANIONGAP '7 8 8     '$ Lipids No results for input(s): "CHOL", "TRIG", "HDL", "LABVLDL", "LDLCALC", "CHOLHDL" in the last 168 hours.  Hematology Recent Labs  Lab 04/27/22 0301 04/28/22 0439 04/29/22 0513  WBC 10.0 11.2* 11.7*  RBC 2.43* 2.53* 2.60*  HGB 7.6* 8.1* 8.0*  HCT 22.6* 23.3* 24.6*  MCV 93.0 92.1 94.6  MCH 31.3 32.0 30.8  MCHC 33.6 34.8 32.5  RDW 13.7 13.8 14.1  PLT 234 294 338    Thyroid  Recent Labs  Lab 04/27/22 1925  TSH 7.440*  FREET4 0.92     BNPNo results for input(s): "BNP", "PROBNP" in the last 168 hours.  DDimer No results for input(s): "DDIMER" in the last 168 hours.   Radiology      Cardiac Studies   04/30/22 : TEE Findings: Left Ventricle: Ejection fraction 60% Mitral Valve: Trace mitral regurgitation Aortic Valve: Bioprosthetic trileaflet aortic valve normal.  Well-seated.  No regurgitation. Tricuspid Valve: Mild  TR Left Atrium: Normal, no left atrial appendage thrombus Right Atrium: Normal Intraatrial septum: Normal Bubble Contrast Study: Not performed  Patient Profile     66 y.o. 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  Postoperative atrial fibrillation: CHA2DS2-VASc of 5.  Currently on Eliquis appropriately dosed.  Tikosyn load in progress.  Potassium 4, magnesium 2.2.  QT significantly prolonged after last dose.  Dose has been reduced to 50 mcg twice daily.  2.  Valvular heart disease 3.  Coronary artery disease Status post AVR/CABG.  Plan per cardiac surgery.   For questions or updates, please contact [place] Please consult [email or web address] for contact info under        Signed, Julie-Ann Vanmaanen Meredith Leeds, MD  05/02/2022, 9:24 AM

## 2022-05-02 NOTE — Progress Notes (Signed)
[address]       [place] [address]             [phone]      2 Days Post-Op Procedure(s) (LRB): TRANSESOPHAGEAL ECHOCARDIOGRAM (TEE) (N/A) CARDIOVERSION (N/A) Subjective: Feels pretty well, some incisional tightness in the chest, only using Tylenol for pain currently  Objective: Vital signs in last 24 hours: Temp:  [97.6 F (36.4 C)-98.7 F (37.1 C)] 98 F (36.7 C) (10/21 0825) Pulse Rate:  [79-88] 82 (10/21 0825) Cardiac Rhythm: Normal sinus rhythm (10/20 1906) Resp:  [18-20] 18 (10/21 0825) BP: (111-126)/(77-81) 126/81 (10/21 0825) SpO2:  [97 %-99 %] 98 % (10/21 0825) Weight:  [84.7 kg] 84.7 kg (10/21 0352)  Hemodynamic parameters for last 24 hours:    Intake/Output from previous day: No intake/output data recorded. Intake/Output this shift: No intake/output data recorded.  General appearance: alert, cooperative, and no distress Heart: regular rate and rhythm Lungs: Mildly diminished left base Abdomen: Benign Extremities: No edema Wound: Incisions healing well without evidence of infection  Lab Results: No results for input(s): "WBC", "HGB", "HCT", "PLT" in the last 72 hours. BMET:  Recent Labs    05/01/22 0107 05/02/22 0140  NA 137 135  K 4.7 4.0  CL 103 103  CO2 26 24  GLUCOSE 124* 129*  BUN 22 20  CREATININE 1.27* 1.30*  CALCIUM 8.9 8.4*    PT/INR:  Recent Labs    04/29/22 2105  LABPROT 16.8*  INR 1.4*   ABG    Component Value Date/Time   PHART 7.365 04/22/2022 2331   HCO3 23.2 04/22/2022 2331   TCO2 29 04/25/2022 0301   ACIDBASEDEF 2.0 04/22/2022 2331   O2SAT 53 04/24/2022 1000   CBG (last 3)  Recent Labs    04/29/22 1129  GLUCAP 103*    Meds Scheduled Meds:  apixaban  5 mg Oral BID   aspirin EC  81 mg Oral Daily   bisacodyl  10 mg Oral Daily   Or   bisacodyl  10 mg Rectal Daily   dapagliflozin propanediol  10 mg Oral Daily   diltiazem  90 mg Oral Q6H   docusate sodium  200 mg Oral Daily    dofetilide  250 mcg Oral BID   ferrous sulfate  325 mg Oral BID PC   fluticasone  1 spray Each Nare Daily   furosemide  40 mg Oral BID   levETIRAcetam  500 mg Oral BID   levothyroxine  100 mcg Oral Q T,Th,S,Su-1800   levothyroxine  50 mcg Oral Q M,W,F   loratadine  10 mg Oral Daily   metoprolol tartrate  50 mg Oral TID   pantoprazole  40 mg Oral Daily   polyethylene glycol  17 g Oral Daily   potassium chloride  40 mEq Oral BID   rosuvastatin  40 mg Oral Daily   sertraline  25 mg Oral Daily   sodium chloride flush  3 mL Intravenous Q12H   sodium chloride flush  3 mL Intravenous Q12H   tamsulosin  0.4 mg Oral Daily   Continuous Infusions:  sodium chloride     sodium chloride     PRN Meds:.sodium chloride, sodium chloride, acetaminophen, azelastine, dextrose, guaiFENesin-dextromethorphan, metoprolol tartrate, sodium chloride flush, sodium chloride flush, traMADol  Xrays ECHO TEE  Result Date: 04/30/2022    TRANSESOPHOGEAL ECHO REPORT   Patient Name:   [name] Date of Exam: 04/30/2022 Medical Rec #:  [id number]  Height:       67.0 in Accession #:    [id number]       Weight:       188.3 lb Date of Birth:  03/22/1956       BSA:          1.971 m Patient Age:    66 years         BP:           100/50 mmHg Patient Gender: M                HR:           110 bpm. Exam Location:  Inpatient Procedure: Transesophageal Echo, 3D Echo, Color Doppler and Cardiac Doppler Indications:     I48.91* Unspecified atrial fibrillation  History:         Patient has prior history of Echocardiogram examinations. Prior                  CABG, Arrythmias:Atrial Fibrillation; Risk Factors:Hypertension                  and Sleep Apnea.                  Aortic Valve: 25 mm Edwards Inspiris Resilia Bioprosthetic                  valve is present in the aortic position. Procedure Date:                  04/22/22.  Sonographer:     Raquel Sarna Senior RDCS Referring Phys:  Shirley Friar Diagnosing Phys: Candee Furbish MD PROCEDURE: After discussion of the risks and benefits of a TEE, an informed consent was obtained from the patient. The transesophogeal probe was passed without difficulty through the esophogus of the patient. Sedation performed by different physician. The patient was monitored while under deep sedation. Anesthestetic sedation was provided intravenously by Anesthesiology: '201mg'$  of Propofol, '60mg'$  of Lidocaine. The patient developed no complications during the procedure. A successful direct current cardioversion was performed at 200 joules with 1 attempt. IMPRESSIONS  1. Left ventricular ejection fraction, by estimation, is 40 to 45%. The left ventricle has mildly decreased function. The left ventricle demonstrates global hypokinesis.  2. Right ventricular systolic function is normal. The right ventricular size is normal.  3. Left atrial size was moderately dilated. No left atrial/left atrial appendage thrombus was detected.  4. Right atrial size was moderately dilated.  5. A small pericardial effusion is present. The pericardial effusion is lateral to the left ventricle.  6. The mitral valve is normal in structure. Mild mitral valve regurgitation.  7. The aortic valve has been repaired/replaced. Aortic valve regurgitation is not visualized. There is a 25 mm Edwards Inspiris Resilia Bioprosthetic valve present in the aortic position. Procedure Date: 04/22/22. Echo findings are consistent with normal structure and function of the aortic valve prosthesis. FINDINGS  Left Ventricle: Left ventricular ejection fraction, by estimation, is 40 to 45%. The left ventricle has mildly decreased function. The left ventricle demonstrates global hypokinesis. The left ventricular internal cavity size was normal in size. Right Ventricle: The right ventricular size is normal. No increase in right ventricular wall thickness. Right ventricular systolic function is normal. Left Atrium: Left atrial size was moderately dilated. No  left atrial/left atrial appendage thrombus was detected. Right Atrium: Right atrial size was moderately dilated. Pericardium: A small pericardial effusion is present. The pericardial effusion is lateral to  the left ventricle. Mitral Valve: The mitral valve is normal in structure. Mild mitral valve regurgitation. Tricuspid Valve: The tricuspid valve is normal in structure. Tricuspid valve regurgitation is trivial. Aortic Valve: The aortic valve has been repaired/replaced. Aortic valve regurgitation is not visualized. There is a 25 mm Edwards Inspiris Resilia Bioprosthetic valve present in the aortic position. Procedure Date: 04/22/22. Echo findings are consistent with normal structure and function of the aortic valve prosthesis. Pulmonic Valve: The pulmonic valve was not well visualized. Pulmonic valve regurgitation is not visualized. Aorta: The aortic root is normal in size and structure. IAS/Shunts: No atrial level shunt detected by color flow Doppler. Candee Furbish MD Electronically signed by Candee Furbish MD Signature Date/Time: 04/30/2022/4:59:36 PM    Final     Assessment/Plan: S/P Procedure(s) (LRB): TRANSESOPHAGEAL ECHOCARDIOGRAM (TEE) (N/A) CARDIOVERSION (N/A)  1 afebrile, vital signs stable, sinus rhythm with short episode of atrial fibrillation this morning, status post cardioversion, getting Tikosyn load, is on Eliquis-EP managing rhythm issues 2  oxygen saturations good on room air 3 creatinine fairly stable at 1.3 4 continue rehab and pulmonary hygiene 5 Home in the next couple days after Tikosyn load felt to be stable from EP perspective  LOS: 10 days    John Giovanni PA-C Pager [phone] 05/02/2022

## 2022-05-02 NOTE — Progress Notes (Addendum)
Pt's 10pm post-tikosyn EKG in physical chart, did not transfer over to Epic. QTc 491

## 2022-05-02 NOTE — Progress Notes (Signed)
Patient with sudden episode of blurry vision that has mostly resolved. Patient states he was sitting straight up and then laid back in the bed and felt better. Neuro intact. Bp 113/79 heart rate 82 on monitor. Patient states this was his only symptom at the time. Family at bedside. Call bell within reach. Verbena Boeding, Bettina Gavia rN

## 2022-05-02 NOTE — Progress Notes (Signed)
Pt wears home CPAP equipment, RT went to assist pt with machine. Pt has refused for tonight. Will continue to monitor.

## 2022-05-02 NOTE — Progress Notes (Signed)
Mobility Specialist Progress Note:   05/02/22 0924  Mobility  Activity Ambulated with assistance in hallway  Level of Assistance Standby assist, set-up cues, supervision of patient - no hands on  Assistive Device Front wheel walker  Distance Ambulated (ft) 400 ft  Activity Response Tolerated well  $Mobility charge 1 Mobility   Pre- Mobility:   74 HR During Mobility: 91 HR Post Mobility:    70 HR  Pt received in bed willing to participate in mobility. No complaints of pain. Pt required 3 short standing rest breaks d/t feeling SOB. Left in bed with call bell in reach and all needs met.   [facility] Surveyor, mining Chat only

## 2022-05-02 NOTE — Progress Notes (Signed)
Pharmacy: Dofetilide (Tikosyn) - Follow Up Assessment and Electrolyte Replacement  Pharmacy consulted to assist in monitoring and replacing electrolytes in this 66 y.o. male admitted on 04/22/2022 s/p CABGx1+ bAVR with post op Afib undergoing dofetilide initiation. First dofetilide dose 535mg q12h 10/19. Dose reduced to dofetilide 250 mcg q12h 10/20.   Labs:    Component Value Date/Time   K 4.0 05/02/2022 0140   MG 2.2 05/02/2022 0140     Plan: Potassium: K >/= 4: No additional supplementation needed. Patient is on scheduled potassium 40 mEq PO BID.    Magnesium: Mg > 2: No additional supplementation needed  CEliseo Gum PharmD PGY1 Pharmacy Resident   05/02/2022  7:05 AM

## 2022-05-02 NOTE — Progress Notes (Addendum)
Patient converted to atrial fib at 1541, rate 110-123 EKG obtained and vital signs in flow sheet. Patient states "he was coming back from the rest room and could feel that his heart was not in rhythm" back in bed call bell within reach. Cumming Ducre, Bettina Gavia RN  [facility] made aware no new orders received. [place]heton Viramontes, Bettina Gavia RN

## 2022-05-02 NOTE — Progress Notes (Signed)
CARDIAC REHAB PHASE I   PRE:  Rate/Rhythm: 81 Sr  BP:  Supine: 100/69  Sitting:  Standing:                              SaO2: 95 RR  MODE:  Ambulation: 790 ft   POST:  Rate/Rhythm: 92 SR  BP:  Supine:   Sitting: 116/80  Standing:    SaO2: 99 RA 1220-1305 Assisted X 1 and used walker to ambulate. Gait steady with walker.VS stable. Pt walked 790 feet  with a couple of standing rest stops. Pt back to the side of ed after walk. Reviewed surgery discharge education with pt and family. They voice understanding.  Rodney Langton RN 05/02/2022 1:05 PM

## 2022-05-02 NOTE — Progress Notes (Signed)
EKG obtained and in chart post Tikosyn dose. Emanuelle Bastos, Bettina Gavia RN

## 2022-05-03 LAB — BASIC METABOLIC PANEL
Anion gap: 12 (ref 5–15)
BUN: 19 mg/dL (ref 8–23)
CO2: 25 mmol/L (ref 22–32)
Calcium: 8.9 mg/dL (ref 8.9–10.3)
Chloride: 101 mmol/L (ref 98–111)
Creatinine, Ser: 1.46 mg/dL — ABNORMAL HIGH (ref 0.61–1.24)
GFR, Estimated: 53 mL/min — ABNORMAL LOW (ref >60)
Glucose, Bld: 105 mg/dL — ABNORMAL HIGH (ref 70–99)
Potassium: 4.1 mmol/L (ref 3.5–5.1)
Sodium: 138 mmol/L (ref 135–145)

## 2022-05-03 LAB — MAGNESIUM: Magnesium: 2.3 mg/dL (ref 1.7–2.4)

## 2022-05-03 MED ORDER — METOPROLOL SUCCINATE ER 100 MG PO TB24
100.0000 mg | ORAL_TABLET | Freq: Two times a day (BID) | ORAL | Status: DC
  Administered 2022-05-03 – 2022-05-04 (×3): 100 mg via ORAL
  Filled 2022-05-03 (×3): qty 1

## 2022-05-03 MED ORDER — FUROSEMIDE 40 MG PO TABS: 40.0000 mg | ORAL_TABLET | Freq: Every day | ORAL | Status: DC

## 2022-05-03 MED ORDER — POTASSIUM CHLORIDE CRYS ER 20 MEQ PO TBCR: 20.0000 meq | EXTENDED_RELEASE_TABLET | Freq: Two times a day (BID) | ORAL | Status: DC

## 2022-05-03 NOTE — Progress Notes (Signed)
Pharmacy: Dofetilide (Tikosyn) - Follow Up Assessment and Electrolyte Replacement  Pharmacy consulted to assist in monitoring and replacing electrolytes in this 66 y.o. male admitted on 04/22/2022 s/p CABGx1+ bAVR with post op Afib undergoing dofetilide initiation. First dofetilide dose 566mg q12h 10/19. Dose reduced to dofetilide 250 mcg q12h 10/20.   Labs:    Component Value Date/Time   K 4.1 05/03/2022 0111   MG 2.3 05/03/2022 0111     Plan: Potassium: K >/= 4: No additional supplementation needed. Patient is on scheduled potassium 40 mEq PO BID.    Magnesium: Mg > 2: No additional supplementation needed  CEliseo Gum PharmD PGY1 Pharmacy Resident   05/03/2022  7:07 AM

## 2022-05-03 NOTE — Progress Notes (Signed)
Pt ambulated x 500 feet with daughter and front wheel walker

## 2022-05-03 NOTE — Progress Notes (Signed)
Patient ambulated in hallway with family and rolling walker. Wende Longstreth, Bettina Gavia rN

## 2022-05-03 NOTE — Progress Notes (Addendum)
Rounding Note    Patient Name: [name] Date of Encounter: 05/03/2022  [place] Cardiologist: None Dr. Einar Gip  Subjective   Feeling well today.  Continues to have short episodes of atrial fibrillation.  Otherwise no complaints.  Inpatient Medications    Scheduled Meds:  apixaban  5 mg Oral BID   aspirin EC  81 mg Oral Daily   bisacodyl  10 mg Oral Daily   Or   bisacodyl  10 mg Rectal Daily   dapagliflozin propanediol  10 mg Oral Daily   diltiazem  90 mg Oral Q6H   docusate sodium  200 mg Oral Daily   dofetilide  250 mcg Oral BID   ferrous sulfate  325 mg Oral BID PC   fluticasone  1 spray Each Nare Daily   furosemide  40 mg Oral BID   levETIRAcetam  500 mg Oral BID   levothyroxine  100 mcg Oral Q T,Th,S,Su-1800   levothyroxine  50 mcg Oral Q M,W,F   loratadine  10 mg Oral Daily   metoprolol tartrate  50 mg Oral TID   pantoprazole  40 mg Oral Daily   polyethylene glycol  17 g Oral Daily   potassium chloride  40 mEq Oral BID   rosuvastatin  40 mg Oral Daily   sertraline  25 mg Oral Daily   sodium chloride flush  3 mL Intravenous Q12H   sodium chloride flush  3 mL Intravenous Q12H   tamsulosin  0.4 mg Oral Daily   Continuous Infusions:  sodium chloride     sodium chloride     PRN Meds: sodium chloride, sodium chloride, acetaminophen, azelastine, dextrose, guaiFENesin-dextromethorphan, metoprolol tartrate, sodium chloride flush, sodium chloride flush, traMADol   Vital Signs    Vitals:   05/02/22 1710 05/02/22 1950 05/02/22 2335 05/03/22 0505  BP:  96/69 109/76 113/80  Pulse: 95 81 79 84  Resp:  '16 18 16  '$ Temp:  97.9 F (36.6 C) 98 F (36.7 C) 98 F (36.7 C)  TempSrc:  Oral Oral Oral  SpO2:  95% 98% 95%  Weight:    83.9 kg  Height:        Intake/Output Summary (Last 24 hours) at 05/03/2022 0835 Last data filed at 05/02/2022 2100 Gross per 24 hour  Intake 360 ml  Output --  Net 360 ml       05/03/2022    5:05 AM 05/02/2022     3:52 AM 05/01/2022    5:28 AM  Last 3 Weights  Weight (lbs) 185 lb 186 lb 11.2 oz 188 lb 4.4 oz  Weight (kg) 83.915 kg 84.687 kg 85.4 kg      Telemetry    Sinus rhythm with short runs of atrial fibrillation  ECG    Sinus rhythm, QTc 460 ms personally reviewed  Physical Exam   GEN: Well nourished, well developed, in no acute distress  HEENT: normal  Neck: no JVD, carotid bruits, or masses Cardiac: RRR; no murmurs, rubs, or gallops,no edema  Respiratory:  clear to auscultation bilaterally, normal work of breathing GI: soft, nontender, nondistended, + BS MS: no deformity or atrophy  Skin: warm and dry Neuro:  Strength and sensation are intact Psych: euthymic mood, full affect   Labs    High Sensitivity Troponin:  No results for input(s): "TROPONINIHS" in the last 720 hours.   Chemistry Recent Labs  Lab 05/01/22 0107 05/02/22 0140 05/03/22 0111  NA 137 135 138  K 4.7 4.0 4.1  CL 103 103 101  CO2 '26 24 25  '$ GLUCOSE 124* 129* 105*  BUN '22 20 19  '$ CREATININE 1.27* 1.30* 1.46*  CALCIUM 8.9 8.4* 8.9  MG 1.9 2.2 2.3  GFRNONAA >60 >60 53*  ANIONGAP '8 8 12     '$ Lipids No results for input(s): "CHOL", "TRIG", "HDL", "LABVLDL", "LDLCALC", "CHOLHDL" in the last 168 hours.  Hematology Recent Labs  Lab 04/27/22 0301 04/28/22 0439 04/29/22 0513  WBC 10.0 11.2* 11.7*  RBC 2.43* 2.53* 2.60*  HGB 7.6* 8.1* 8.0*  HCT 22.6* 23.3* 24.6*  MCV 93.0 92.1 94.6  MCH 31.3 32.0 30.8  MCHC 33.6 34.8 32.5  RDW 13.7 13.8 14.1  PLT 234 294 338    Thyroid  Recent Labs  Lab 04/27/22 1925  TSH 7.440*  FREET4 0.92     BNPNo results for input(s): "BNP", "PROBNP" in the last 168 hours.  DDimer No results for input(s): "DDIMER" in the last 168 hours.   Radiology      Cardiac Studies   04/30/22 : TEE Findings: Left Ventricle: Ejection fraction 60% Mitral Valve: Trace mitral regurgitation Aortic Valve: Bioprosthetic trileaflet aortic valve normal.  Well-seated.  No  regurgitation. Tricuspid Valve: Mild TR Left Atrium: Normal, no left atrial appendage thrombus Right Atrium: Normal Intraatrial septum: Normal Bubble Contrast Study: Not performed  Patient Profile     66 y.o. 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  Postoperative atrial fibrillation: CHA2DS2-VASc of 5.  Currently on Eliquis 5 mg twice daily.  Dofetilide load in progress.  Potassium 4.1, magnesium 2.2.  QTc has improved on lower dose of dofetilide.  We Burnham Trost continue with current management.  He has had some rapid atrial fibrillation despite the use of dofetilide.  Potentially due to inflammation.  We Nohlan Burdin stop metoprolol and start Toprol-XL twice daily.  If QTc remains stable on current ECG, would be able to be discharged on dofetilide.  Follow-up has been made in A-fib clinic.  From Friday before prescribing dofetilide I have reached out to the [facility] listed for the patient. They have both 263mg and 1276m doses in stock. Please note that pharmacy has made recommendation to stop his Lexapro and consider alternative that Ewell Benassi not cause/contribute to QT prolongation  2.  Valvular heart disease 3.  Coronary artery disease Status post AVR/CABG    For questions or updates, please contact [facility] consult [email or web address] for contact info under        Signed, Aliveah Gallant MaMeredith LeedsMD  05/03/2022, 8:35 AM

## 2022-05-03 NOTE — Progress Notes (Signed)
Patient has home CPAP at bedside. 

## 2022-05-03 NOTE — Progress Notes (Signed)
2 hour pst tikoysn EKG is in epic and on shadow chart

## 2022-05-03 NOTE — Progress Notes (Signed)
Pt pre Tikosyn  QTC was .490, and two hour post EKG is in EPIC and shadow chart

## 2022-05-03 NOTE — Progress Notes (Signed)
[address]       [place] [address]             [phone]      3 Days Post-Op Procedure(s) (LRB): TRANSESOPHAGEAL ECHOCARDIOGRAM (TEE) (N/A) CARDIOVERSION (N/A) Subjective: Feels well  Objective: Vital signs in last 24 hours: Temp:  [97.9 F (36.6 C)-98.3 F (36.8 C)] 98.3 F (36.8 C) (10/22 0912) Pulse Rate:  [75-115] 87 (10/22 0916) Cardiac Rhythm: Normal sinus rhythm (10/22 0928) Resp:  [16-18] 16 (10/22 0505) BP: (96-113)/(69-83) 112/71 (10/22 0912) SpO2:  [91 %-99 %] 97 % (10/22 0912) Weight:  [83.9 kg] 83.9 kg (10/22 0505)  Hemodynamic parameters for last 24 hours:    Intake/Output from previous day: 10/21 0701 - 10/22 0700 In: 360 [P.O.:360] Out: -  Intake/Output this shift: No intake/output data recorded.  General appearance: alert, cooperative, and no distress Heart: regular rate and rhythm Lungs: clear to auscultation bilaterally Abdomen: benign Extremities: trace right ankle edema Wound: incis healing well  Lab Results: No results for input(s): "WBC", "HGB", "HCT", "PLT" in the last 72 hours. BMET:  Recent Labs    05/02/22 0140 05/03/22 0111  NA 135 138  K 4.0 4.1  CL 103 101  CO2 24 25  GLUCOSE 129* 105*  BUN 20 19  CREATININE 1.30* 1.46*  CALCIUM 8.4* 8.9    PT/INR: No results for input(s): "LABPROT", "INR" in the last 72 hours. ABG    Component Value Date/Time   PHART 7.365 04/22/2022 2331   HCO3 23.2 04/22/2022 2331   TCO2 29 04/25/2022 0301   ACIDBASEDEF 2.0 04/22/2022 2331   O2SAT 53 04/24/2022 1000   CBG (last 3)  No results for input(s): "GLUCAP" in the last 72 hours.  Meds Scheduled Meds:  apixaban  5 mg Oral BID   aspirin EC  81 mg Oral Daily   bisacodyl  10 mg Oral Daily   Or   bisacodyl  10 mg Rectal Daily   dapagliflozin propanediol  10 mg Oral Daily   diltiazem  90 mg Oral Q6H   docusate sodium  200 mg Oral Daily   dofetilide  250 mcg Oral BID   ferrous sulfate  325 mg Oral BID PC    fluticasone  1 spray Each Nare Daily   furosemide  40 mg Oral BID   levETIRAcetam  500 mg Oral BID   levothyroxine  100 mcg Oral Q T,Th,S,Su-1800   levothyroxine  50 mcg Oral Q M,W,F   loratadine  10 mg Oral Daily   metoprolol succinate  100 mg Oral BID   pantoprazole  40 mg Oral Daily   polyethylene glycol  17 g Oral Daily   potassium chloride  40 mEq Oral BID   rosuvastatin  40 mg Oral Daily   sertraline  25 mg Oral Daily   sodium chloride flush  3 mL Intravenous Q12H   sodium chloride flush  3 mL Intravenous Q12H   tamsulosin  0.4 mg Oral Daily   Continuous Infusions:  sodium chloride     sodium chloride     PRN Meds:.sodium chloride, sodium chloride, acetaminophen, azelastine, dextrose, guaiFENesin-dextromethorphan, metoprolol tartrate, sodium chloride flush, sodium chloride flush, traMADol  Xrays No results found.  Assessment/Plan: S/P Procedure(s) (LRB): TRANSESOPHAGEAL ECHOCARDIOGRAM (TEE) (N/A) CARDIOVERSION (N/A)  1 afebrile, vital signs stable, still having some intermittent atrial fibrillation.  Spoke with Dr. Curt Bears who feels that the patient is stable from EP perspective for discharge if twelve-lead shows QTc  is stable.  Tikosyn recommended at 250 mcg twice daily 2 oxygen saturations are good on room air 3 weight continues to trend lower, now below preoperative weight-we will discontinue IV Lasix,  creatinine is showing a rising trend so we will hold on further for now.  It is 1.46 today, we will repeat in the morning to make sure it is trending in the right direction 4 continue routine rehab and pulmonary hygiene, hopefully home in the morning   LOS: 11 days    John Giovanni PA-C Pager [phone] 05/03/2022

## 2022-05-03 NOTE — Progress Notes (Signed)
Ambulated in hallway with family and rolling walker. Deleon Passe, Bettina Gavia RN

## 2022-05-03 NOTE — Progress Notes (Signed)
Post tikosyn EKG done and in chart. Sully Dyment, Bettina Gavia RN

## 2022-05-04 ENCOUNTER — Encounter (HOSPITAL_COMMUNITY): Payer: Self-pay | Admitting: Cardiology

## 2022-05-04 LAB — BASIC METABOLIC PANEL
Anion gap: 8 (ref 5–15)
BUN: 20 mg/dL (ref 8–23)
CO2: 26 mmol/L (ref 22–32)
Calcium: 8.6 mg/dL — ABNORMAL LOW (ref 8.9–10.3)
Chloride: 102 mmol/L (ref 98–111)
Creatinine, Ser: 1.33 mg/dL — ABNORMAL HIGH (ref 0.61–1.24)
GFR, Estimated: 59 mL/min — ABNORMAL LOW (ref >60)
Glucose, Bld: 132 mg/dL — ABNORMAL HIGH (ref 70–99)
Potassium: 3.9 mmol/L (ref 3.5–5.1)
Sodium: 136 mmol/L (ref 135–145)

## 2022-05-04 LAB — MAGNESIUM: Magnesium: 2.1 mg/dL (ref 1.7–2.4)

## 2022-05-04 MED ORDER — METOPROLOL SUCCINATE ER 100 MG PO TB24: 100.0000 mg | ORAL_TABLET | Freq: Two times a day (BID) | ORAL | 3 refills | Status: DC

## 2022-05-04 MED ORDER — ROSUVASTATIN CALCIUM 40 MG PO TABS: 40.0000 mg | ORAL_TABLET | Freq: Every day | ORAL | 3 refills | Status: DC

## 2022-05-04 MED ORDER — DAPAGLIFLOZIN PROPANEDIOL 10 MG PO TABS: 10.0000 mg | ORAL_TABLET | Freq: Every day | ORAL | 3 refills | Status: DC

## 2022-05-04 MED ORDER — POTASSIUM CHLORIDE CRYS ER 20 MEQ PO TBCR: 20.0000 meq | EXTENDED_RELEASE_TABLET | Freq: Every day | ORAL | 3 refills | Status: DC | PRN

## 2022-05-04 MED ORDER — DILTIAZEM HCL 90 MG PO TABS: 90.0000 mg | ORAL_TABLET | Freq: Four times a day (QID) | ORAL | 3 refills | Status: DC

## 2022-05-04 MED ORDER — ACETAMINOPHEN 500 MG PO TABS: 500.0000 mg | ORAL_TABLET | Freq: Four times a day (QID) | ORAL | 0 refills | Status: DC | PRN

## 2022-05-04 MED ORDER — POTASSIUM CHLORIDE CRYS ER 20 MEQ PO TBCR
40.0000 meq | EXTENDED_RELEASE_TABLET | Freq: Once | ORAL | Status: AC
  Administered 2022-05-04: 40 meq via ORAL
  Filled 2022-05-04: qty 2

## 2022-05-04 MED ORDER — DOFETILIDE 250 MCG PO CAPS: 250.0000 ug | ORAL_CAPSULE | Freq: Two times a day (BID) | ORAL | 3 refills | Status: DC

## 2022-05-04 MED ORDER — ASPIRIN 81 MG PO TBEC: 81.0000 mg | DELAYED_RELEASE_TABLET | Freq: Every day | ORAL | 12 refills | Status: DC

## 2022-05-04 MED ORDER — TAMSULOSIN HCL 0.4 MG PO CAPS: 0.4000 mg | ORAL_CAPSULE | Freq: Every day | ORAL | 33 refills | Status: DC

## 2022-05-04 MED ORDER — FUROSEMIDE 40 MG PO TABS: 40.0000 mg | ORAL_TABLET | Freq: Every day | ORAL | 3 refills | Status: DC | PRN

## 2022-05-04 MED ORDER — APIXABAN 5 MG PO TABS: 5.0000 mg | ORAL_TABLET | Freq: Two times a day (BID) | ORAL | 3 refills | Status: DC

## 2022-05-04 MED ORDER — TRAMADOL HCL 50 MG PO TABS: 50.0000 mg | ORAL_TABLET | Freq: Four times a day (QID) | ORAL | 0 refills | Status: DC | PRN

## 2022-05-04 MED ORDER — SERTRALINE HCL 25 MG PO TABS: 25.0000 mg | ORAL_TABLET | Freq: Every day | ORAL | 3 refills | Status: DC

## 2022-05-04 NOTE — Progress Notes (Addendum)
Pharmacy: Dofetilide (Tikosyn) - Follow Up Assessment and Electrolyte Replacement  Pharmacy consulted to assist in monitoring and replacing electrolytes in this 66 y.o. male admitted on 04/22/2022 s/p CABGx1+ bAVR with post op Afib undergoing dofetilide initiation. First dofetilide dose 545mg q12h 10/19. Dose reduced to dofetilide 250 mcg q12h 10/20.   Labs:    Component Value Date/Time   K 3.9 05/04/2022 0114   MG 2.1 05/04/2022 0114   Plan: Potassium: K 3.8-3.9:  Give KCl 40 mEq po x1   Magnesium: Mg > 2: No additional supplementation needed  As patient has required on average 40 mEq of potassium replacement every day with every furosemide '40mg'$  PO daily, recommend discharging patient with prescription for:  Potassium chloride 40 mEq  daily for every '40mg'$  of furosemide administered by patient.   Thank you for allowing pharmacy to participate in this patient's care   CLuisa Hart PharmD, BCPS Clinical Pharmacist 05/04/2022 9:41 AM   Please refer to [facility] pharmacy phone number

## 2022-05-04 NOTE — TOC Transition Note (Signed)
Transition of Care (TOC) - CM/SW Discharge Note Marvetta Gibbons RN, BSN Transitions of Care Unit 4E- RN Case Manager See Treatment Team for direct phone #    Patient Details  Name: [name] MRN: [id number] Date of Birth: 1956-05-07  Transition of Care [facility]) CM/SW Contact:  Dawayne Patricia, RN Phone Number: 05/04/2022, 2:24 PM   Clinical Narrative:    Pt stable for transition home today, meds have been sent to [facility]. Family to transport home.   No further TOC needs noted.    Final next level of care: Home/Self Care Barriers to Discharge: Barriers Resolved   Patient Goals and CMS Choice Patient states their goals for this hospitalization and ongoing recovery are:: return home   Choice offered to / list presented to : NA  Discharge Placement                 Home      Discharge Plan and Services   Discharge Planning Services: CM Consult, Medication Assistance            DME Arranged: N/A DME Agency: NA       HH Arranged: NA HH Agency: NA        Social Determinants of Health (SDOH) Interventions Housing Interventions: Intervention Not Indicated   Readmission Risk Interventions    05/04/2022    2:24 PM  Readmission Risk Prevention Plan  Transportation Screening Complete  Home Care Screening Complete  Medication Review (RN CM) Complete

## 2022-05-04 NOTE — Progress Notes (Signed)
Mobility Specialist Progress Note:   05/04/22 1016  Mobility  Activity Ambulated with assistance in hallway  Level of Assistance Independent after set-up  Assistive Device Front wheel walker  Distance Ambulated (ft) 800 ft  Activity Response Tolerated well  $Mobility charge 1 Mobility   Pt received in bed willing to participate in mobility. Complaints of 4/10 sternal incision pain. Left in chair with call ell in reach and all needs met.   [facility] Surveyor, mining Chat only

## 2022-05-04 NOTE — Progress Notes (Addendum)
      [address]       [place] [address]             [phone]       4 Days Post-Op Procedure(s) (LRB): TRANSESOPHAGEAL ECHOCARDIOGRAM (TEE) (N/A) CARDIOVERSION (N/A)  Subjective:  No new complaints. Had some mild sternal discomfort. Otherwise he is ready to go home.  Objective: Vital signs in last 24 hours: Temp:  [98.1 F (36.7 C)-98.5 F (36.9 C)] 98.1 F (36.7 C) (10/23 0505) Pulse Rate:  [68-87] 68 (10/23 0505) Cardiac Rhythm: Normal sinus rhythm (10/22 1947) Resp:  [16-18] 18 (10/23 0505) BP: (105-117)/(71-82) 112/72 (10/23 0505) SpO2:  [94 %-98 %] 97 % (10/23 0505)  Intake/Output from previous day: 10/22 0701 - 10/23 0700 In: 240 [P.O.:240] Out: -   General appearance: alert, cooperative, and no distress Heart: regular rate and rhythm Lungs: clear to auscultation bilaterally Abdomen: soft, non-tender; bowel sounds normal; no masses,  no organomegaly Extremities: edema trace Wound: clean and dry  Lab Results: No results for input(s): "WBC", "HGB", "HCT", "PLT" in the last 72 hours. BMET:  Recent Labs    05/03/22 0111 05/04/22 0114  NA 138 136  K 4.1 3.9  CL 101 102  CO2 25 26  GLUCOSE 105* 132*  BUN 19 20  CREATININE 1.46* 1.33*  CALCIUM 8.9 8.6*    PT/INR: No results for input(s): "LABPROT", "INR" in the last 72 hours. ABG    Component Value Date/Time   PHART 7.365 04/22/2022 2331   HCO3 23.2 04/22/2022 2331   TCO2 29 04/25/2022 0301   ACIDBASEDEF 2.0 04/22/2022 2331   O2SAT 53 04/24/2022 1000   CBG (last 3)  No results for input(s): "GLUCAP" in the last 72 hours.  Assessment/Plan: S/P Procedure(s) (LRB): TRANSESOPHAGEAL ECHOCARDIOGRAM (TEE) (N/A) CARDIOVERSION (N/A)  CV- PAF, mostly maintaining NSR- continue Cardizem, Tikosyn ( Qtc stable), Toprol XL, Eliquis Pulm-off oxygen, continue IS Renal- creatinine improved to 1.33, weight is below baseline.. will d/c on prn lasix regime Dispo- patient stable, will d/c  home today   LOS: 12 days    Ellwood Handler, PA-C 05/04/2022  Agree with above Pt doing well

## 2022-05-04 NOTE — Progress Notes (Signed)
CT sutures were removed and steri strips applied. Patient and family given discharge instructions, medication list and follow up appointments. IV and tele were dcd. All questions were answered. Will be transported to exit via wheel chair and nursing staff. Daylon Lafavor, Bettina Gavia RN

## 2022-05-07 ENCOUNTER — Other Ambulatory Visit: Payer: Self-pay | Admitting: Thoracic Surgery (Cardiothoracic Vascular Surgery)

## 2022-05-07 DIAGNOSIS — Z952 Presence of prosthetic heart valve: Secondary | ICD-10-CM

## 2022-05-11 ENCOUNTER — Encounter (HOSPITAL_COMMUNITY): Payer: Self-pay | Admitting: Physician Assistant

## 2022-05-11 ENCOUNTER — Ambulatory Visit (HOSPITAL_COMMUNITY)
Admit: 2022-05-11 | Discharge: 2022-05-11 | Disposition: A | Discharge status: Home / Self Care | Payer: 59 | Attending: Physician Assistant | Admitting: Physician Assistant

## 2022-05-11 ENCOUNTER — Ambulatory Visit (INDEPENDENT_AMBULATORY_CARE_PROVIDER_SITE_OTHER): Payer: Self-pay | Admitting: Physician Assistant

## 2022-05-11 ENCOUNTER — Ambulatory Visit
Admission: RE | Admit: 2022-05-11 | Discharge: 2022-05-11 | Disposition: A | Discharge status: Home / Self Care | Payer: 59 | Source: Ambulatory Visit | Attending: Thoracic Surgery (Cardiothoracic Vascular Surgery) | Admitting: Thoracic Surgery (Cardiothoracic Vascular Surgery)

## 2022-05-11 VITALS — BP 126/84 | HR 68 | Resp 20 | Ht 67.0 in | Wt 180.5 lb

## 2022-05-11 VITALS — BP 132/70 | HR 72 | Ht 67.0 in | Wt 180.0 lb

## 2022-05-11 DIAGNOSIS — E785 Hyperlipidemia, unspecified: Secondary | ICD-10-CM | POA: Insufficient documentation

## 2022-05-11 DIAGNOSIS — Q211 Atrial septal defect, unspecified: Secondary | ICD-10-CM | POA: Insufficient documentation

## 2022-05-11 DIAGNOSIS — I129 Hypertensive chronic kidney disease with stage 1 through stage 4 chronic kidney disease, or unspecified chronic kidney disease: Secondary | ICD-10-CM | POA: Insufficient documentation

## 2022-05-11 DIAGNOSIS — I2510 Atherosclerotic heart disease of native coronary artery without angina pectoris: Secondary | ICD-10-CM | POA: Insufficient documentation

## 2022-05-11 DIAGNOSIS — J45909 Unspecified asthma, uncomplicated: Secondary | ICD-10-CM | POA: Diagnosis not present

## 2022-05-11 DIAGNOSIS — N189 Chronic kidney disease, unspecified: Secondary | ICD-10-CM | POA: Insufficient documentation

## 2022-05-11 DIAGNOSIS — D6869 Other thrombophilia: Secondary | ICD-10-CM

## 2022-05-11 DIAGNOSIS — Z951 Presence of aortocoronary bypass graft: Secondary | ICD-10-CM

## 2022-05-11 DIAGNOSIS — G4733 Obstructive sleep apnea (adult) (pediatric): Secondary | ICD-10-CM | POA: Insufficient documentation

## 2022-05-11 DIAGNOSIS — Z952 Presence of prosthetic heart valve: Secondary | ICD-10-CM | POA: Insufficient documentation

## 2022-05-11 DIAGNOSIS — I4819 Other persistent atrial fibrillation: Secondary | ICD-10-CM

## 2022-05-11 DIAGNOSIS — Q2112 Patent foramen ovale: Secondary | ICD-10-CM | POA: Insufficient documentation

## 2022-05-11 DIAGNOSIS — Z7901 Long term (current) use of anticoagulants: Secondary | ICD-10-CM | POA: Insufficient documentation

## 2022-05-11 LAB — MAGNESIUM: Magnesium: 2.3 mg/dL (ref 1.7–2.4)

## 2022-05-11 LAB — BASIC METABOLIC PANEL
Anion gap: 9 (ref 5–15)
BUN: 20 mg/dL (ref 8–23)
CO2: 24 mmol/L (ref 22–32)
Calcium: 9.4 mg/dL (ref 8.9–10.3)
Chloride: 106 mmol/L (ref 98–111)
Creatinine, Ser: 1.11 mg/dL (ref 0.61–1.24)
GFR, Estimated: >60 mL/min (ref >60)
Glucose, Bld: 108 mg/dL — ABNORMAL HIGH (ref 70–99)
Potassium: 3.9 mmol/L (ref 3.5–5.1)
Sodium: 139 mmol/L (ref 135–145)

## 2022-05-11 MED ORDER — DOFETILIDE 250 MCG PO CAPS: 250.0000 ug | ORAL_CAPSULE | Freq: Two times a day (BID) | ORAL | 1 refills | Status: DC

## 2022-05-11 NOTE — Progress Notes (Signed)
Primary Care Physician: Cari Caraway, MD Primary Cardiologist: Dr Einar Gip Primary Electrophysiologist: Dr Lovena Le Referring Physician: Dr Lucie Leather is a 66 y.o. 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 Patient presented 04/22/22 for planned AV replacement and CABG. He developed afib with RVR post operatively and was started on dofetilide. Amiodarone was not started due to a iodine allergy. He underwent TEE guided DCCV on 04/30/22. Patient is on Eliquis for a CHADS2VASC score of 3. Patient remains in [place] today. His primary concern is insomnia, felt to be related to recent surgery. No bleeding issues on anticoagulation.   Today, he denies symptoms of palpitations, shortness of breath, orthopnea, PND, lower extremity edema, dizziness, presyncope, syncope, bleeding, or neurologic sequela. The patient is tolerating medications without difficulties and is otherwise without complaint today.    Atrial Fibrillation Risk Factors:  he does have symptoms or diagnosis of sleep apnea. he is compliant with CPAP therapy. he does not have a history of rheumatic fever.   he has a BMI of Body mass index is 28.19 kg/m.Marland Kitchen Filed Weights   05/11/22 1409  Weight: 81.6 kg    Family History  Problem Relation Age of Onset   Bladder Cancer Mother    Stroke Father    Thyroid disease Sister    Diabetes Brother    Allergies Other        whole family   Asthma Other        whole family     Atrial Fibrillation Management history:  Previous antiarrhythmic drugs: dofetilide Previous cardioversions: 04/30/22 Previous ablations: none CHADS2VASC score: 3 Anticoagulation history: Eliquis   Past Medical History:  Diagnosis Date   ADHD    Anginal pain ([place])    Anxiety    Arthritis    Asthma     excerise induced   Brain abscess    Chronic renal insufficiency, stage III (moderate) (HCC)    Coronary artery disease    DVT (deep venous thrombosis) (HCC)    Esophageal reflux    History of osteomyelitis    History of transfusion of platelets    Hx of pulmonary embolus    x 2   Hyperlipidemia    Hypertension    Hypothyroidism    Neuropathy    Patent foramen ovale    Seizures (HCC)    Sleep apnea    Sleep study done 5-6  years ago machine set at 10   Past Surgical History:  Procedure Laterality Date   ANTERIOR CRUCIATE LIGAMENT REPAIR     AORTIC VALVE REPLACEMENT N/A 04/22/2022   Procedure: AORTIC VALVE REPLACEMENT (AVR) USING 25 MM INSPIRIS RESILIA  AORTIC VALVE;  Surgeon: Coralie Common, MD;  Location: [facility];  Service: Open Heart Surgery;  Laterality: N/A;   BRAIN SURGERY     abscess drainage x 2   CARDIOVERSION N/A 04/30/2022   Procedure: CARDIOVERSION;  Surgeon: Jerline Pain, MD;  Location: [facility];  Service: Cardiovascular;  Laterality: N/A;   CARPAL TUNNEL RELEASE     CORONARY ARTERY BYPASS GRAFT N/A 04/22/2022   Procedure: CORONARY ARTERY BYPASS GRAFTING (CABG) TIMES ONE USE THE ENDOSCOPICALLY HARVESTED RIGHT GREATER SAPHENOUS VEIN;  Surgeon: Coralie Common, MD;  Location: [facility];  Service: Open Heart Surgery;  Laterality: N/A;  Open midline  sternotomy   CRANIOTOMY     CRANIOTOMY  03/30/2012   Procedure: CRANIOTOMY TUMOR EXCISION;  Surgeon: Winfield Cunas, MD;  Location: [facility];  Service: Neurosurgery;  Laterality: Right;   craniotomy for intracranial mass.   CRANIOTOMY  03/31/2012   Procedure: CRANIOTOMY TUMOR EXCISION;  Surgeon: Winfield Cunas, MD;  Location: [facility];  Service: Neurosurgery;  Laterality: Right;  Craniotomy for excision of mass   Deep Vein     FRACTURE SURGERY     right arm   LAPAROSCOPIC APPENDECTOMY  11/20/2011   LAPAROSCOPIC APPENDECTOMY  11/20/2011   Procedure: APPENDECTOMY LAPAROSCOPIC;  Surgeon: Harl Bowie, MD;  Location:  [facility];  Service: General;  Laterality: N/A;   PATENT FORAMEN OVALE CLOSURE     Pulmonary Embolus     RIGHT/LEFT HEART CATH AND CORONARY ANGIOGRAPHY N/A 03/10/2022   Procedure: RIGHT/LEFT HEART CATH AND CORONARY ANGIOGRAPHY;  Surgeon: Adrian Prows, MD;  Location: [facility];  Service: Cardiovascular;  Laterality: N/A;   TEE WITHOUT CARDIOVERSION N/A 04/22/2022   Procedure: TRANSESOPHAGEAL ECHOCARDIOGRAM (TEE);  Surgeon: Coralie Common, MD;  Location: [facility];  Service: Open Heart Surgery;  Laterality: N/A;   TEE WITHOUT CARDIOVERSION N/A 04/30/2022   Procedure: TRANSESOPHAGEAL ECHOCARDIOGRAM (TEE);  Surgeon: Jerline Pain, MD;  Location: [facility];  Service: Cardiovascular;  Laterality: N/A;   ULNAR TUNNEL RELEASE      Current Outpatient Medications  Medication Sig Dispense Refill   acetaminophen (TYLENOL) 500 MG tablet Take 1-2 tablets (500-1,000 mg total) by mouth every 6 (six) hours as needed for moderate pain. 30 tablet 0   albuterol (PROVENTIL HFA;VENTOLIN HFA) 108 (90 BASE) MCG/ACT inhaler Inhale 2 puffs into the lungs every 6 (six) hours as needed for wheezing or shortness of breath.     apixaban (ELIQUIS) 5 MG TABS tablet Take 1 tablet (5 mg total) by mouth 2 (two) times daily. 60 tablet 3   aspirin EC 81 MG tablet Take 1 tablet (81 mg total) by mouth daily. Swallow whole. 30 tablet 12   azelastine (ASTELIN) 0.1 % nasal spray Place 1 spray into both nostrils 2 (two) times daily as needed for allergies.     dapagliflozin propanediol (FARXIGA) 10 MG TABS tablet Take 1 tablet (10 mg total) by mouth daily. 30 tablet 3   diltiazem (CARDIZEM) 90 MG tablet Take 1 tablet (90 mg total) by mouth every 6 (six) hours. 120 tablet 3   fluticasone-salmeterol (ADVAIR HFA) 230-21 MCG/ACT inhaler Inhale 2 puffs into the lungs 2 (two) times daily.     furosemide (LASIX) 40 MG tablet Take 1 tablet (40 mg total) by mouth daily as needed. For weight gain of 3 lbs in 24 hours or 5 lbs in 48 30 tablet 3    levETIRAcetam (KEPPRA) 500 MG tablet Take 500 mg by mouth 2 (two) times daily.      levothyroxine (SYNTHROID) 50 MCG tablet TAKE 1 TABLET BY MOUTH  DAILY (Patient taking differently: Take 50-100 mcg by mouth See admin instructions. 50 mcg daily in the morning, alternating with 100 mcg every other day) 90 tablet 3   metoprolol succinate (TOPROL-XL) 100 MG 24 hr tablet Take 1 tablet (100 mg total) by mouth 2 (two) times daily. Take with or immediately following a meal. 60 tablet 3   pantoprazole (PROTONIX) 40 MG tablet Take 40 mg by mouth every other day.     potassium chloride SA (KLOR-CON M) 20 MEQ tablet Take 1 tablet (20 mEq  total) by mouth daily as needed. On days you take Lasix 30 tablet 3   rosuvastatin (CRESTOR) 40 MG tablet Take 1 tablet (40 mg total) by mouth daily. 30 tablet 3   sertraline (ZOLOFT) 25 MG tablet Take 1 tablet (25 mg total) by mouth daily. 30 tablet 3   tamsulosin (FLOMAX) 0.4 MG CAPS capsule Take 1 capsule (0.4 mg total) by mouth daily. 30 capsule 33   traMADol (ULTRAM) 50 MG tablet Take 1 tablet (50 mg total) by mouth every 6 (six) hours as needed for severe pain. 30 tablet 0   dofetilide (TIKOSYN) 250 MCG capsule Take 1 capsule (250 mcg total) by mouth 2 (two) times daily. 180 capsule 1   No current facility-administered medications for this encounter.    Allergies  Allergen Reactions   Iodinated Contrast Media Anaphylaxis   Iohexol Anaphylaxis and Shortness Of Breath     Code: HIVES, Desc: hives and tachycardia last time pt received IV CM kdean 07/19/06, Onset Date: [id number]   On 9/9 pt had a ct head w/ and received 13hr premedication. Was fine after scan, but broke out in hives when he got home.  Took '100mg'$  benedryl at home PO, and was fine.    [place] Thyroid [Thyroid]     mouth swelling   Codeine     agitation   Dilantin [Phenytoin] Other (See Comments)    Destroyed Blood platelets    Heparin Hives    On going therapy    Hydrocodone     Agitation    Nuvigil [Armodafinil]     rash   Other     Follows Kosher Diet; Needs Kosher products   Shellfish Allergy Other (See Comments)    Mouth tingles   Flagyl [Metronidazole] Rash   Rocephin [Ceftriaxone] Rash    Social History   Socioeconomic History   Marital status: Married    Spouse name: Not on file   Number of children: 5   Years of education: college   Highest education level: Not on file  Occupational History    Employer: [employer]  Tobacco Use   Smoking status: Never   Smokeless tobacco: Never   Tobacco comments:    Never smoke 05/11/22  Vaping Use   Vaping Use: Never used  Substance and Sexual Activity   Alcohol use: No    Comment: quit 20 years ago   Drug use: No   Sexual activity: Never  Other Topics Concern   Not on file  Social History Narrative   Lives with  Wife   Caffeine    Social Determinants of Health   Financial Resource Strain: Not on file  Food Insecurity: No Food Insecurity (04/28/2022)   Hunger Vital Sign    Worried About Running Out of Food in the Last Year: Never true    Ran Out of Food in the Last Year: Never true  Transportation Needs: No Transportation Needs (04/28/2022)   PRAPARE - Hydrologist (Medical): No    Lack of Transportation (Non-Medical): No  Physical Activity: Not on file  Stress: Not on file  Social Connections: Not on file  Intimate Partner Violence: Not At Risk (04/28/2022)   Humiliation, Afraid, Rape, and Kick questionnaire    Fear of Current or Ex-Partner: No    Emotionally Abused: No    Physically Abused: No    Sexually Abused: No     ROS- All systems are reviewed and negative except as per the HPI above.  Physical Exam: Vitals:   05/11/22 1409  BP: 132/70  Pulse: 72  Weight: 81.6 kg  Height: '5\' 7"'$  (1.702 m)    GEN- The patient is a well appearing male, alert and oriented x 3 today.   Head- normocephalic, atraumatic Eyes-  Sclera clear, conjunctiva pink Ears- hearing  intact Oropharynx- clear Neck- supple  Lungs- Clear to ausculation bilaterally, normal work of breathing Heart- Regular rate and rhythm, no murmurs, rubs or gallops  GI- soft, NT, ND, + BS Extremities- no clubbing, cyanosis, or edema MS- no significant deformity or atrophy Skin- no rash or lesion Psych- euthymic mood, full affect Neuro- strength and sensation are intact  Wt Readings from Last 3 Encounters:  05/11/22 81.6 kg  05/11/22 81.9 kg  05/03/22 83.9 kg    EKG today demonstrates  SR Vent. rate 72 BPM PR interval 180 ms QRS duration 98 ms QT/QTcB 412/451 ms  Echo 02/04/22 demonstrated  Mildly depressed LV systolic function with EF 46%. Left ventricle cavity  is moderate to severely dilated. Moderate concentric hypertrophy of the  left ventricle. Hypokinetic global wall motion. Calculated EF 46%.  Left atrial cavity is moderately dilated at 4.2 cm.  Structurally normal trileaflet aortic valve. Moderate to severe aortic  regurgitation.  Structurally normal mitral valve. Mild to moderate mitral regurgitation.  Structurally normal tricuspid valve. Moderate tricuspid regurgitation.  Moderate to severe pulmonary hypertension. RVSP measures 60 mmHg.  Dilated pulmonary artery.  IVC is dilated with respiratory variation.    Epic records are reviewed at length today  CHA2DS2-VASc Score = 3  The patient's score is based upon: CHF History: 0 HTN History: 1 Diabetes History: 0 Stroke History: 0 Vascular Disease History: 1 Age Score: 1 Gender Score: 0       ASSESSMENT AND PLAN: 1. Persistent atrial fibrillation  The patient's CHA2DS2-VASc score is 3, indicating a 3.2% annual risk of stroke.   Postoperative  S/p DCCV 04/30/22 and dofetilide loading Continue dofetilide 250 mcg BID. QT stable. Continue Eliquis 5 mg BID Continue Toprol 100 mg BID Continue diltiazem 90 mg q 6 hours. Could consider decreasing, discontinuing or consolidating dose.   2. Secondary  Hypercoagulable State (ICD10:  D68.69) The patient is at significant risk for stroke/thromboembolism based upon his CHA2DS2-VASc Score of 3.  Continue Apixaban (Eliquis).   3. CAD S/p CABG No anginal symptoms.   4. Obstructive sleep apnea Encouraged compliance with CPAP therapy.   5. HTN Stable, no changes today.  6. Severe AR S/p AVR 04/22/22   Follow up with Dr Einar Gip and Oda Kilts as scheduled. AF clinic as needed.    [facility] [address] [place], [place] [address] [phone] 05/11/2022 4:04 PM

## 2022-05-11 NOTE — Patient Instructions (Signed)
Continue to observe sternal precautions with no lifting greater than 10 pounds  Avoid driving until you see Dr. Lavonna Monarch . Continue to use the incentive spirometer several times a day  No change in medications from CT surgery standpoint  Follow-up with Dr. Lavonna Monarch in 2 to 3 weeks as scheduled.

## 2022-05-11 NOTE — Progress Notes (Signed)
[address]       [place] [address]             [phone]       HPI:  [name] is a 66 year old gentleman with a past history of hypertension, non-ischemic cardiomyopathy, aortic insufficiency, stage III chronic kidney disease, and aortic valve insufficiency.  He underwent aortic valve replacement by Dr. Lavonna Monarch on 04/22/2022 with a 25 mm Edwards Resilia bovine pericardial tissue valve and single-vessel coronary bypass grafting with saphenous vein graft to the RCA.  His postoperative course was significant for persistent atrial fibrillation.  He could not be treated with amiodarone due to his history of iodine allergy.  He underwent transesophageal echocardiogram by Dr. Marlou Porch on 04/30/2022 followed by DC cardioversion which was successful.  The patient was loaded on Tikosyn following cardioversion.  He was discharged home on 05/04/2022 in stable condition.  He returns today for scheduled follow-up. [name] complains of general weakness, sleeping poorly, and having poor appetite.  He denies having any shortness of breath but he does have a persistent cough.  He is having minimal pain from the sternotomy incision.   Current Outpatient Medications  Medication Sig Dispense Refill   acetaminophen (TYLENOL) 500 MG tablet Take 1-2 tablets (500-1,000 mg total) by mouth every 6 (six) hours as needed for moderate pain. 30 tablet 0   albuterol (PROVENTIL HFA;VENTOLIN HFA) 108 (90 BASE) MCG/ACT inhaler Inhale 2 puffs into the lungs every 6 (six) hours as needed for wheezing or shortness of breath.     apixaban (ELIQUIS) 5 MG TABS tablet Take 1 tablet (5 mg total) by mouth 2 (two) times daily. 60 tablet 3   aspirin EC 81 MG tablet Take 1 tablet (81 mg total) by mouth daily. Swallow whole. 30 tablet 12   azelastine (ASTELIN) 0.1 % nasal spray Place 1 spray into both nostrils 2 (two) times daily as needed for allergies.     dapagliflozin propanediol (FARXIGA) 10 MG  TABS tablet Take 1 tablet (10 mg total) by mouth daily. 30 tablet 3   diltiazem (CARDIZEM) 90 MG tablet Take 1 tablet (90 mg total) by mouth every 6 (six) hours. 120 tablet 3   dofetilide (TIKOSYN) 250 MCG capsule Take 1 capsule (250 mcg total) by mouth 2 (two) times daily. 60 capsule 3   fluticasone-salmeterol (ADVAIR HFA) 230-21 MCG/ACT inhaler Inhale 2 puffs into the lungs 2 (two) times daily.     furosemide (LASIX) 40 MG tablet Take 1 tablet (40 mg total) by mouth daily as needed. For weight gain of 3 lbs in 24 hours or 5 lbs in 48 30 tablet 3   levETIRAcetam (KEPPRA) 500 MG tablet Take 500 mg by mouth 2 (two) times daily.      levothyroxine (SYNTHROID) 50 MCG tablet TAKE 1 TABLET BY MOUTH  DAILY (Patient taking differently: Take 50-100 mcg by mouth See admin instructions. 50 mcg daily in the morning, alternating with 100 mcg every other day) 90 tablet 3   metoprolol succinate (TOPROL-XL) 100 MG 24 hr tablet Take 1 tablet (100 mg total) by mouth 2 (two) times daily. Take with or immediately following a meal. 60 tablet 3   pantoprazole (PROTONIX) 40 MG tablet Take 40 mg by mouth every other day.     potassium chloride SA (KLOR-CON M) 20 MEQ tablet Take 1 tablet (20 mEq total) by mouth daily as needed. On days you take Lasix 30 tablet 3   rosuvastatin (  CRESTOR) 40 MG tablet Take 1 tablet (40 mg total) by mouth daily. 30 tablet 3   sertraline (ZOLOFT) 25 MG tablet Take 1 tablet (25 mg total) by mouth daily. 30 tablet 3   tamsulosin (FLOMAX) 0.4 MG CAPS capsule Take 1 capsule (0.4 mg total) by mouth daily. 30 capsule 33   traMADol (ULTRAM) 50 MG tablet Take 1 tablet (50 mg total) by mouth every 6 (six) hours as needed for severe pain. 30 tablet 0   No current facility-administered medications for this visit.    Physical Exam  Vital signs BP 126/84 Pulse 68 Respirations 20 SPO2 98%  Heart: Regular rate and rhythm, no murmur Chest: Breath sounds are full and clear to auscultation.   Sternotomy incision is healing with no sign of complication.  The chest tube sites are also healing well.  Chest x-ray today was reviewed and shows clear lung fields with no significant effusions.  Wires are intact and well aligned. Extremities: The right lower extremity EVH incision is well approximated and healing.  He has a induration along the [facility] tunnel no inflammation and no significant tenderness.  Diagnostic Tests: CLINICAL DATA:  Status post aortic valve repair.   EXAM: CHEST - 2 VIEW   COMPARISON:  April 26, 2022.   FINDINGS: Stable cardiomediastinal silhouette. Status post coronary bypass graft and aortic valve repair. Both lungs are clear. The visualized skeletal structures are unremarkable.   IMPRESSION: No active cardiopulmonary disease.     Electronically Signed   By: Marijo Conception M.D.   On: 05/11/2022 12:06     Impression / Plan: [name] is overall making satisfactory progress following aortic valve replacement and single-vessel coronary bypass grafting.  I explained that insomnia and poor appetite were common issues following heart surgery and would improve with time.  He is concerned about his weakness relative to all the new medications he is taking.  I explained that these would be managed by cardiology for the most part.  He may use melatonin for insomnia if desired.  He is encouraged to continue advancing activity as tolerated with walking.  Continue using incentive spirometer several times a day. He should continue to observe sternal precautions until he is 3 months post surgery.  He has no plans to return to work during the remainder of this year. We will schedule follow-up with Dr. Lavonna Monarch in 2 to 3 weeks.  He has scheduled follow-up with the A-fib clinic later today and with the electrophysiology team in 3 weeks.  Antony Odea, PA-C [facility] [phone]

## 2022-05-13 ENCOUNTER — Encounter: Payer: Self-pay | Admitting: Cardiology

## 2022-05-13 ENCOUNTER — Ambulatory Visit: Payer: No Typology Code available for payment source | Admitting: Cardiology

## 2022-05-13 VITALS — BP 104/74 | HR 70 | Temp 97.7°F | Resp 16 | Ht 67.0 in | Wt 181.2 lb

## 2022-05-13 DIAGNOSIS — I480 Paroxysmal atrial fibrillation: Secondary | ICD-10-CM

## 2022-05-13 DIAGNOSIS — I2510 Atherosclerotic heart disease of native coronary artery without angina pectoris: Secondary | ICD-10-CM

## 2022-05-13 DIAGNOSIS — I10 Essential (primary) hypertension: Secondary | ICD-10-CM

## 2022-05-13 DIAGNOSIS — Z952 Presence of prosthetic heart valve: Secondary | ICD-10-CM

## 2022-05-13 NOTE — Progress Notes (Signed)
Primary Physician/Referring:  Cari Caraway, MD  Patient ID: [name], male    DOB: 1955-08-22, 66 y.o.   MRN: [id number]  Chief Complaint  Patient presents with   Cardioversion   Follow-up   HPI:    [name]  is a 66 y.o. Caucasian male with brain abscess diagnosed in 2012 for which he underwent craniotomy, has mild residual left-sided facial and arm weakness and seizures controlled with Kepra.  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  Also during hospital admission, patient developed severe platypnea-orthodeoxia syndrome and hence underwent successful repair of the atrial septal defect with implantation of a 28 mm CardioSEAL septal occluder on 01/11/2006.  He was also tested positive for DVT and small peripheral PE at that time.    His past medical history significant for hypertension, hyperlipidemia, OSA on CPAP, bronchial asthma, aortic root dilatation and severe aortic regurgitation and chronic stage IIIa kidney disease related to prior NSAID use.  Patient underwent aortic valve replacement using a 25 mm bioprosthetic valve and CABG x1 with SVG to PDA on 04/22/2022 by Dr. Coralie Common, post procedure complicated by A-fib with RVR which was extremely difficult to rate control, eventually started on dofetilide and underwent TEE guided direct-current cardioversion.    I was not involved in his care post-procedure.  He is currently doing well, states that he still feels very fatigued and tired and his sleep pattern has been disturbed.  Otherwise no other specific complaints.  Denies chest pain, palpitations, dizziness or syncope.  He has noticed his blood pressure to be low and he feels tired and his blood pressure is low.  Past Medical History:  Diagnosis Date   ADHD    Anginal pain ([place])    Anxiety    Arthritis    Asthma    excerise induced   Brain abscess     Chronic renal insufficiency, stage III (moderate) (HCC)    Coronary artery disease    DVT (deep venous thrombosis) (HCC)    Esophageal reflux    History of osteomyelitis    History of transfusion of platelets    Hx of pulmonary embolus    x 2   Hyperlipidemia    Hypertension    Hypothyroidism    Neuropathy    Patent foramen ovale    S/P  Aortic valve replacement using a 28m Inspiris Pericardial valve 04/22/2022 04/22/2022   Seizures ([place]    Sleep apnea    Sleep study done 5-6  years ago machine set at 10   Social History   Tobacco Use   Smoking status: Never   Smokeless tobacco: Never   Tobacco comments:    Never smoke 05/11/22  Substance Use Topics   Alcohol use: No    Comment: quit 20 years ago   Marital Status: Married  ROS  Review of Systems  Cardiovascular:  Positive for dyspnea on exertion. Negative for chest pain and leg swelling.  Respiratory:  Positive for wheezing.   Musculoskeletal:  Positive for arthritis.  Gastrointestinal:  Negative for melena.   Objective  Blood pressure 104/74, pulse 70, temperature 97.7 F (36.5 C), temperature source Temporal, resp. rate 16, height 5' 7" (1.702 m), weight 181 lb 3.2 oz (82.2 kg), SpO2 99 %.     05/13/2022    1:51 PM 05/11/2022    2:09 PM 05/11/2022   12:09  PM  Vitals with BMI  Height 5' 7" 5' 7" 5' 7"  Weight 181 lbs 3 oz 180 lbs 180 lbs 8 oz  BMI 28.37 33.54 56.25  Systolic 638 937 342  Diastolic 74 70 84  Pulse 70 72 68     Physical Exam Neck:     Vascular: No carotid bruit or JVD.  Cardiovascular:     Rate and Rhythm: Normal rate and regular rhythm.     Pulses: Intact distal pulses.     Heart sounds: Normal heart sounds. No murmur heard.    No gallop.  Pulmonary:     Effort: Pulmonary effort is normal.     Breath sounds: Normal breath sounds.  Abdominal:     General: Bowel sounds are normal.     Palpations: Abdomen is soft.  Musculoskeletal:     Right lower leg: No edema.     Left lower leg:  No edema.    Laboratory examination:   Lab Results  Component Value Date   NA 139 05/11/2022   K 3.9 05/11/2022   CO2 24 05/11/2022   GLUCOSE 108 (H) 05/11/2022   BUN 20 05/11/2022   CREATININE 1.11 05/11/2022   CALCIUM 9.4 05/11/2022   EGFR 54 (L) 03/03/2022   GFRNONAA >60 05/11/2022    estimated creatinine clearance is 68 mL/min (by C-G formula based on SCr of 1.11 mg/dL).     Latest Ref Rng & Units 05/11/2022    2:34 PM 05/04/2022    1:14 AM 05/03/2022    1:11 AM  CMP  Glucose 70 - 99 mg/dL 108  132  105   BUN 8 - 23 mg/dL _0 Creatinine 0.61 - 1.24 mg/dL 1.11  1.33  1.46   Sodium 135 - 145 mmol/L 139  136  138   Potassium 3.5 - 5.1 mmol/L 3.9  3.9  4.1   Chloride 98 - 111 mmol/L 106  102  101   CO2 22 - 32 mmol/L _1 Calcium 8.9 - 10.3 mg/dL 9.4  8.6  8.9       Latest Ref Rng & Units 04/29/2022    5:13 AM 04/28/2022    4:39 AM 04/27/2022    3:01 AM  CBC  WBC 4.0 - 10.5 K/uL 11.7  11.2  10.0   Hemoglobin 13.0 - 17.0 g/dL 8.0  8.1  7.6   Hematocrit 39.0 - 52.0 % 24.6  23.3  22.6   Platelets 150 - 400 K/uL 338  294  234    Lipid Panel     Component Value Date/Time   CHOL 127 03/03/2022 0812   TRIG 100 03/03/2022 0812   HDL 53 03/03/2022 0812   LDLCALC 55 03/03/2022 0812   LABVLDL 19 03/03/2022 0812   External labs:   Labs 11/05/2021:  TSH normal at 2.07.  Labs 02/11/2021:  Serum glucose 91 mg, BUN 26, creatinine 1.45, EGFR 54 mL.  Total cholesterol 127, triglycerides 87, HDL 51, LDL 59.  Medications and allergies   Allergies  Allergen Reactions   Iodinated Contrast Media Anaphylaxis   Iohexol Anaphylaxis and Shortness Of Breath     Code: HIVES, Desc: hives and tachycardia last time pt received IV CM kdean 07/19/06, Onset Date: [id number]   On 9/9 pt had a ct head w/ and received 13hr premedication. Was fine after scan, but broke out in hives when he got home.  Took 180m benedryl at home PO,  and was fine.    [place] Thyroid  [Thyroid]     mouth swelling   Codeine     agitation   Dilantin [Phenytoin] Other (See Comments)    Destroyed Blood platelets    Heparin Hives    On going therapy    Hydrocodone     Agitation   Nuvigil [Armodafinil]     rash   Other     Follows Kosher Diet; Needs Kosher products   Shellfish Allergy Other (See Comments)    Mouth tingles   Flagyl [Metronidazole] Rash   Rocephin [Ceftriaxone] Rash     Current Outpatient Medications:    acetaminophen (TYLENOL) 500 MG tablet, Take 1-2 tablets (500-1,000 mg total) by mouth every 6 (six) hours as needed for moderate pain., Disp: 30 tablet, Rfl: 0   albuterol (PROVENTIL HFA;VENTOLIN HFA) 108 (90 BASE) MCG/ACT inhaler, Inhale 2 puffs into the lungs every 6 (six) hours as needed for wheezing or shortness of breath., Disp: , Rfl:    apixaban (ELIQUIS) 5 MG TABS tablet, Take 1 tablet (5 mg total) by mouth 2 (two) times daily., Disp: 60 tablet, Rfl: 3   aspirin EC 81 MG tablet, Take 1 tablet (81 mg total) by mouth daily. Swallow whole., Disp: 30 tablet, Rfl: 12   azelastine (ASTELIN) 0.1 % nasal spray, Place 1 spray into both nostrils 2 (two) times daily as needed for allergies., Disp: , Rfl:    dapagliflozin propanediol (FARXIGA) 10 MG TABS tablet, Take 1 tablet (10 mg total) by mouth daily., Disp: 30 tablet, Rfl: 3   dofetilide (TIKOSYN) 250 MCG capsule, Take 1 capsule (250 mcg total) by mouth 2 (two) times daily., Disp: 180 capsule, Rfl: 1   fluticasone-salmeterol (ADVAIR HFA) 230-21 MCG/ACT inhaler, Inhale 2 puffs into the lungs 2 (two) times daily., Disp: , Rfl:    levETIRAcetam (KEPPRA) 500 MG tablet, Take 500 mg by mouth 2 (two) times daily. , Disp: , Rfl:    levothyroxine (SYNTHROID) 50 MCG tablet, TAKE 1 TABLET BY MOUTH  DAILY (Patient taking differently: Take 50-100 mcg by mouth See admin instructions. 50 mcg daily in the morning, alternating with 100 mcg every other day), Disp: 90 tablet, Rfl: 3   metoprolol succinate (TOPROL-XL) 100 MG  24 hr tablet, Take 1 tablet (100 mg total) by mouth 2 (two) times daily. Take with or immediately following a meal., Disp: 60 tablet, Rfl: 3   pantoprazole (PROTONIX) 40 MG tablet, Take 40 mg by mouth every other day., Disp: , Rfl:    rosuvastatin (CRESTOR) 40 MG tablet, Take 1 tablet (40 mg total) by mouth daily., Disp: 30 tablet, Rfl: 3   sertraline (ZOLOFT) 25 MG tablet, Take 1 tablet (25 mg total) by mouth daily., Disp: 30 tablet, Rfl: 3   tamsulosin (FLOMAX) 0.4 MG CAPS capsule, Take 1 capsule (0.4 mg total) by mouth daily., Disp: 30 capsule, Rfl: 33   traMADol (ULTRAM) 50 MG tablet, Take 1 tablet (50 mg total) by mouth every 6 (six) hours as needed for severe pain., Disp: 30 tablet, Rfl: 0    Radiology:   Ultrasound of the abdomen 09/18/2020: 1. Multiple hepatic cysts, largest measuring 7.0 cm in the right hepatic lobe. 2. Two small 1.2 cm and 0.9 cm round hyperechoic lesions in the right hepatic lobe, probably hemangiomas, benign. If definitive characterization is required, consider follow-up MRI abdomen without and with contrast in 6 months. 3. Low-density lesions throughout the liver upper measuring up to 6 cm, favor cysts, but these  cannot be fully characterized on this noncontrast study.   CT chest without contrast 12/24/2021: 1. 4.2 cm ascending thoracic aortic aneurysm, stable from the cardiac CT performed on 08/21/2020. Recommend annual imaging followup by CTA or MRA. This recommendation follows 2010 ACCF/AHA/AATS/ACR/ASA/SCA/SCAI/SIR/STS/SVM Guidelines for the Diagnosis and Management of Patients with Thoracic Aortic Disease. Circulation. 2010; 121: P379-K240. Aortic aneurysm NOS (ICD10-I71.9) 2. No acute findings. 3. Minor coronary artery calcifications and mild aortic atherosclerosis. 4. Aortic Atherosclerosis (ICD10-I70.0).  Cardiac Studies:   ASD repair 01/11/2006: 1.  Intracardiac echocardiogram. 2.  Closure of the PFO with 28 mm CardioSeal septal occluder.  Right  Left Heart Catheterization 03/10/22:  RA 7/5, mean 5 mmHg RV 39/5, EDP 10 mmHg PA 36/17, mean 26 mmHg. PW 21/21, mean 18 mmHg. QP/QS 1.0.  PVR 1.70 Wood units.   LV 155/13, EDP 30 mmHg. Ao 143/69, mean 100 mmHg.  There was no pressure gradient across the aortic valve.   RCA: Anterior origin.  Dominant and continues to the PDA gives origin to very small PL branches, proximal segment has a 70% stenosis which is focal.  Distal RCA has a tandem 30% stenosis.  Mild disease is present throughout the RCA. LM: Large caliber vessel.  No significant disease. LAD: Large vessel giving origin to a very large D1.  There is mild 20 to 30% stenosis in the proximal to mid segment of the LAD.  Mild disease in the ostium of the D1. LCx: Very large caliber vessel giving origin to a large OM1 which has secondary branches.  Circumflex continues in the AV groove, OM1 is large with mild disease.  Proximal segment has mild ectasia and a 10 to 20% stenosis.   Impression: Mild pulmonary hypertension secondary to elevated EDP, single-vessel coronary artery disease that is of significance involving the right coronary artery which is moderate-sized vessel.  Mild disease is evident in the LAD and CX.   Aortic valve replacement using a 25 mm bioprosthetic valve and CABG x1 with SVG to PDA on 04/22/2022    04/30/22 : TEE Findings: Left Ventricle: Ejection fraction 60% Mitral Valve: Trace mitral regurgitation Aortic Valve: Bioprosthetic trileaflet aortic valve normal.  Well-seated.  No regurgitation. Tricuspid Valve: Mild TR Left Atrium: Normal, no left atrial appendage thrombus Right Atrium: Normal Intraatrial septum: Normal     EKG:   EKG 05/13/2022: Normal sinus rhythm at rate of 70 bpm, normal axis, poor R wave progression, cannot exclude anteroseptal infarct old.  Nonspecific T abnormality.  Normal QTc at 421 ms.  Compared to 02/26/2022, no significant change.  Assessment     ICD-10-CM   1. S/P  Aortic valve  replacement using a 32m Inspiris Pericardial valve 04/22/2022  Z95.2 AMB referral to cardiac rehabilitation    2. Coronary artery disease involving native coronary artery of native heart without angina pectoris  I25.10 AMB referral to cardiac rehabilitation    3. Paroxysmal atrial fibrillation (HCC)  I48.0 EKG 12-Lead    4. Primary hypertension  I10       Medications Discontinued During This Encounter  Medication Reason   diltiazem (CARDIZEM) 90 MG tablet Discontinued by provider   furosemide (LASIX) 40 MG tablet No longer needed (for PRN medications)   potassium chloride SA (KLOR-CON M) 20 MEQ tablet No longer needed (for PRN medications)   No orders of the defined types were placed in this encounter.  Orders Placed This Encounter  Procedures   AMB referral to cardiac rehabilitation    Referral Priority:   Routine  Referral Type:   Consultation    Number of Visits Requested:   1   EKG 12-Lead   Recommendations:   [name] is a 66 y.o. Caucasian male with brain abscess diagnosed in 2012 for which he underwent craniotomy, has mild residual left-sided facial and arm weakness and seizures controlled with Kepra.  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  Also during hospital admission, patient developed severe platypnea-orthodeoxia syndrome and hence underwent successful repair of the atrial septal defect with implantation of a 28 mm CardioSEAL septal occluder on 01/11/2006.  He was also tested positive for DVT and small peripheral PE at that time.    His past medical history significant for hypertension, hyperlipidemia, OSA on CPAP, bronchial asthma, aortic root dilatation and severe aortic regurgitation and chronic stage IIIa kidney disease related to prior NSAID use.  Patient underwent aortic valve replacement using a 25 mm bioprosthetic valve and CABG x1 with SVG to PDA on  04/22/2022 by Dr. Coralie Common, post procedure complicated by A-fib with RVR which was extremely difficult to rate control, eventually started on dofetilide and underwent TEE guided direct-current cardioversion.    I was not involved in his care post-procedure.  1. S/P  Aortic valve replacement using a 89m Inspiris Pericardial valve 04/22/2022 I reviewed his extensive hospital records and also periprocedural atrial fibrillation and TEE-guided cardioversion and initiation of Tikosyn.  2. Coronary artery disease involving native coronary artery of native heart without angina pectoris Patient had single-vessel coronary disease that was significant, had SVG to PDA bypass on 04/22/2022.  Presently stable without angina.  I will refer him for cardiac rehab.  3. Paroxysmal atrial fibrillation (HCC) He is maintaining sinus rhythm on Tikosyn.  He is also on anticoagulation with Eliquis and also on aspirin 81 mg daily.  Continue the same for now, we will consider discontinuing Tikosyn in 4 to 6 weeks if he maintains sinus rhythm, advised him to continue to use CPAP regularly to reduce the risk of recurrence of atrial fibrillation and also not to gain the weight back.  4. Primary hypertension Blood pressure is well controlled.  He has noticed dizziness, wife states that his blood pressure sometimes is running around 80 to 90 mmHg, will discontinue diltiazem.  Continue other medications.  I discontinued furosemide and potassium supplements as he has not used it in the past 3 weeks.  This was a 40-minute office visit encounter.  I would like to see him back in 4 weeks for follow-up.  I will repeat echocardiogram to reestablish baseline since aortic valve surgery.     JAdrian Prows MD, FThe [facility]11/07/2021, 2:35 PM Office: [phone]

## 2022-05-15 ENCOUNTER — Encounter: Payer: Self-pay | Admitting: Cardiology

## 2022-05-15 ENCOUNTER — Telehealth (HOSPITAL_COMMUNITY): Payer: Self-pay

## 2022-05-15 NOTE — Telephone Encounter (Signed)
Pt returned CR phone call and stated he is interested in CR. Explained scheduling process and went over insurance, patient verbalized understanding.

## 2022-05-17 ENCOUNTER — Encounter: Payer: Self-pay | Admitting: Cardiology

## 2022-05-18 NOTE — Telephone Encounter (Signed)
From pt

## 2022-05-19 ENCOUNTER — Encounter: Payer: Self-pay | Admitting: Thoracic Surgery (Cardiothoracic Vascular Surgery)

## 2022-05-19 NOTE — Anesthesia Postprocedure Evaluation (Signed)
Anesthesia Post Note  Patient: [name]  Procedure(s) Performed: TRANSESOPHAGEAL ECHOCARDIOGRAM (TEE) CARDIOVERSION     Patient location during evaluation: PACU Anesthesia Type: General Level of consciousness: awake Pain management: pain level controlled Vital Signs Assessment: post-procedure vital signs reviewed and stable Respiratory status: spontaneous breathing Cardiovascular status: stable Postop Assessment: no apparent nausea or vomiting Anesthetic complications: no   No notable events documented.  Last Vitals:  Vitals:   05/04/22 0818 05/04/22 1309  BP: 103/78 115/79  Pulse: 75   Resp: 18 18  Temp: 36.6 C   SpO2: 99%     Last Pain:  Vitals:   05/04/22 1309  TempSrc:   PainSc: 5    Pain Goal: Patients Stated Pain Goal: 0 (04/27/22 1200)                 Huston Foley

## 2022-05-24 NOTE — Progress Notes (Unsigned)
[name]       [name],[name]             [phone]           [name] [place] Medical Record #[id number] Date of Birth: 06-16-1956  Adrian Prows, MD Cari Caraway, MD  Chief Complaint:   SP AVR/CABG  History of Present Illness:     Pt is now about a month out from tissue AVR and CABG x1 complicated with Atrial Fibrillation with RVR requiring antiarrhythmic meds, NOAC, and cardioversion. Pts biggest issue is inability to sleep secondary to sternal discomfort. Has no incisional drainage but discomfort mostly to Right side of chest and into pectoral muscle. He occasionally gets a pop sensation when he reaches over in bed for CPAP machine. He overall is tired but admits to feeling better week to week. He is eating and moving his bowels well. He is scheduled for ECHO in December    Past Medical History:  Diagnosis Date   ADHD    Anginal pain ([place])    Anxiety    Arthritis    Asthma    excerise induced   Brain abscess    Chronic renal insufficiency, stage III (moderate) (HCC)    Coronary artery disease    DVT (deep venous thrombosis) (HCC)    Esophageal reflux    History of osteomyelitis    History of transfusion of platelets    Hx of pulmonary embolus    x 2   Hyperlipidemia    Hypertension    Hypothyroidism    Neuropathy    Patent foramen ovale    S/P  Aortic valve replacement using a 16m Inspiris Pericardial valve 04/22/2022 04/22/2022   [place]    Sleep apnea    Sleep study done 5-6  years ago machine set at 10    Past Surgical History:  Procedure Laterality Date   ANTERIOR CRUCIATE LIGAMENT REPAIR     AORTIC VALVE REPLACEMENT N/A 04/22/2022   Procedure: AORTIC VALVE REPLACEMENT (AVR) USING 25 MM INSPIRIS RESILIA  AORTIC VALVE;  Surgeon: WCoralie Common MD;  [place] [facility]  Service: Open Heart Surgery;  Laterality: N/A;   BRAIN SURGERY     abscess drainage x 2   CARDIOVERSION N/A 04/30/2022   Procedure:  CARDIOVERSION;  Surgeon: SJerline Pain MD;  [facility];  Service: Cardiovascular;  Laterality: N/A;   CARPAL TUNNEL RELEASE     CORONARY ARTERY BYPASS GRAFT N/A 04/22/2022   Procedure: CORONARY ARTERY BYPASS GRAFTING (CABG) TIMES ONE USE THE ENDOSCOPICALLY HARVESTED RIGHT GREATER SAPHENOUS VEIN;  Surgeon: WCoralie Common MD;  [place] [facility]  Service: Open Heart Surgery;  Laterality: N/A;  Open midline sternotomy   CRANIOTOMY     CRANIOTOMY  03/30/2012   Procedure: CRANIOTOMY TUMOR EXCISION;  Surgeon: KWinfield Cunas MD;  [facility];  Service: Neurosurgery;  Laterality: Right;   craniotomy for intracranial mass.   CRANIOTOMY  03/31/2012   Procedure: CRANIOTOMY TUMOR EXCISION;  Surgeon: KWinfield Cunas MD;  [facility];  Service: Neurosurgery;  Laterality: Right;  Craniotomy for excision of mass   Deep Vein     FRACTURE SURGERY     right arm   LAPAROSCOPIC APPENDECTOMY  11/20/2011   LAPAROSCOPIC APPENDECTOMY  11/20/2011   Procedure: APPENDECTOMY LAPAROSCOPIC;  Surgeon: DHarl Bowie MD;  [facility];  Service: General;  Laterality: N/A;   PATENT FORAMEN OVALE CLOSURE  Pulmonary Embolus     RIGHT/LEFT HEART CATH AND CORONARY ANGIOGRAPHY N/A 03/10/2022   Procedure: RIGHT/LEFT HEART CATH AND CORONARY ANGIOGRAPHY;  Surgeon: Adrian Prows, MD;  Location: [facility];  Service: Cardiovascular;  Laterality: N/A;   TEE WITHOUT CARDIOVERSION N/A 04/22/2022   Procedure: TRANSESOPHAGEAL ECHOCARDIOGRAM (TEE);  Surgeon: Coralie Common, MD;  Location: [facility];  Service: Open Heart Surgery;  Laterality: N/A;   TEE WITHOUT CARDIOVERSION N/A 04/30/2022   Procedure: TRANSESOPHAGEAL ECHOCARDIOGRAM (TEE);  Surgeon: Jerline Pain, MD;  [facility];  Service: Cardiovascular;  Laterality: N/A;   ULNAR TUNNEL RELEASE      Social History   Tobacco Use  Smoking Status Never  Smokeless Tobacco Never  Tobacco Comments   Never smoke 05/11/22    Social  History   Substance and Sexual Activity  Alcohol Use No   Comment: quit 20 years ago    Social History   Socioeconomic History   Marital status: Married    Spouse name: Not on file   Number of children: 5   Years of education: college   Highest education level: Not on file  Occupational History    Employer: [employer]  Tobacco Use   Smoking status: Never   Smokeless tobacco: Never   Tobacco comments:    Never smoke 05/11/22  Vaping Use   Vaping Use: Never used  Substance and Sexual Activity   Alcohol use: No    Comment: quit 20 years ago   Drug use: No   Sexual activity: Never  Other Topics Concern   Not on file  Social History Narrative   Lives with  Wife   Caffeine    Social Determinants of Health   Financial Resource Strain: Not on file  Food Insecurity: No Food Insecurity (04/28/2022)   Hunger Vital Sign    Worried About Running Out of Food in the Last Year: Never true    Ran Out of Food in the Last Year: Never true  Transportation Needs: No Transportation Needs (04/28/2022)   PRAPARE - Hydrologist (Medical): No    Lack of Transportation (Non-Medical): No  Physical Activity: Not on file  Stress: Not on file  Social Connections: Not on file  Intimate Partner Violence: Not At Risk (04/28/2022)   Humiliation, Afraid, Rape, and Kick questionnaire    Fear of Current or Ex-Partner: No    Emotionally Abused: No    Physically Abused: No    Sexually Abused: No    Allergies  Allergen Reactions   Iodinated Contrast Media Anaphylaxis   Iohexol Anaphylaxis and Shortness Of Breath     Code: HIVES, Desc: hives and tachycardia last time pt received IV CM kdean 07/19/06, Onset Date: [id number]   On 9/9 pt had a ct head w/ and received 13hr premedication. Was fine after scan, but broke out in hives when he got home.  Took '100mg'$  benedryl at home PO, and was fine.    [place] Thyroid [Thyroid]     mouth swelling   Codeine     agitation    Dilantin [Phenytoin] Other (See Comments)    Destroyed Blood platelets    Heparin Hives    On going therapy    Hydrocodone     Agitation   Nuvigil [Armodafinil]     rash   Other     Follows Kosher Diet; Needs Kosher products   Shellfish Allergy Other (See Comments)    Mouth tingles   Flagyl [  Metronidazole] Rash   Rocephin [Ceftriaxone] Rash    Current Outpatient Medications  Medication Sig Dispense Refill   acetaminophen (TYLENOL) 500 MG tablet Take 1-2 tablets (500-1,000 mg total) by mouth every 6 (six) hours as needed for moderate pain. 30 tablet 0   albuterol (PROVENTIL HFA;VENTOLIN HFA) 108 (90 BASE) MCG/ACT inhaler Inhale 2 puffs into the lungs every 6 (six) hours as needed for wheezing or shortness of breath.     apixaban (ELIQUIS) 5 MG TABS tablet Take 1 tablet (5 mg total) by mouth 2 (two) times daily. 60 tablet 3   aspirin EC 81 MG tablet Take 1 tablet (81 mg total) by mouth daily. Swallow whole. 30 tablet 12   azelastine (ASTELIN) 0.1 % nasal spray Place 1 spray into both nostrils 2 (two) times daily as needed for allergies.     dapagliflozin propanediol (FARXIGA) 10 MG TABS tablet Take 1 tablet (10 mg total) by mouth daily. 30 tablet 3   dofetilide (TIKOSYN) 250 MCG capsule Take 1 capsule (250 mcg total) by mouth 2 (two) times daily. 180 capsule 1   fluticasone-salmeterol (ADVAIR HFA) 230-21 MCG/ACT inhaler Inhale 2 puffs into the lungs 2 (two) times daily.     levETIRAcetam (KEPPRA) 500 MG tablet Take 500 mg by mouth 2 (two) times daily.      levothyroxine (SYNTHROID) 50 MCG tablet TAKE 1 TABLET BY MOUTH  DAILY (Patient taking differently: Take 50-100 mcg by mouth See admin instructions. 50 mcg daily in the morning, alternating with 100 mcg every other day) 90 tablet 3   metoprolol succinate (TOPROL-XL) 100 MG 24 hr tablet Take 1 tablet (100 mg total) by mouth 2 (two) times daily. Take with or immediately following a meal. 60 tablet 3   pantoprazole (PROTONIX) 40 MG tablet  Take 40 mg by mouth every other day.     rosuvastatin (CRESTOR) 40 MG tablet Take 1 tablet (40 mg total) by mouth daily. 30 tablet 3   sertraline (ZOLOFT) 25 MG tablet Take 1 tablet (25 mg total) by mouth daily. 30 tablet 3   tamsulosin (FLOMAX) 0.4 MG CAPS capsule Take 1 capsule (0.4 mg total) by mouth daily. 30 capsule 33   traMADol (ULTRAM) 50 MG tablet Take 1 tablet (50 mg total) by mouth every 6 (six) hours as needed for severe pain. 30 tablet 0   No current facility-administered medications for this visit.     Family History  Problem Relation Age of Onset   Bladder Cancer Mother    Stroke Father    Thyroid disease Sister    Diabetes Brother    Allergies Other        whole family   Asthma Other        whole family       Physical Exam: There were no vitals taken for this visit. Lungs: Overall clear Card: RR without murmur Wound with well healed incision and with no popping on deep pressure Ext: no edema    Diagnostic Studies & Laboratory data: I have personally reviewed the following studies and agree with the findings     Recent Radiology Findings:   No results found.    Recent Lab Findings: Lab Results  Component Value Date   WBC 11.7 (H) 04/29/2022   HGB 8.0 (L) 04/29/2022   HCT 24.6 (L) 04/29/2022   PLT 338 04/29/2022   GLUCOSE 108 (H) 05/11/2022   CHOL 127 03/03/2022   TRIG 100 03/03/2022   HDL 53 03/03/2022   [place]  55 03/03/2022   ALT 17 04/20/2022   AST 22 04/20/2022   NA 139 05/11/2022   K 3.9 05/11/2022   CL 106 05/11/2022   CREATININE 1.11 05/11/2022   BUN 20 05/11/2022   CO2 24 05/11/2022   TSH 7.440 (H) 04/27/2022   INR 1.4 (H) 04/29/2022   HGBA1C 5.2 04/20/2022      Assessment / Plan:     SP AVR/CABG Hemodynamics with good BP off Diltiazem. Will follow for now Atrial Fibrillation: seems well controlled on Tykosyn and NOAC. Will let cardiology discontinue when they reevaluate him next month Sternal Discomfort: pt describes being  able to pop his sternum before surgery. We discussed etiology either costochondral disjunction or sternotomy instability. He will refrain from lifting or moving his arms to stretch for another month and evaluate then. If discomfort continues or worsens at that time will get CT of chest to evaluate. Pt and wife understand and are in agreement.   I have spent 30 min in review of the records, viewing studies and in face to face with patient and in coordination of future care    Coralie Common 05/24/2022 9:58 AM

## 2022-05-25 ENCOUNTER — Ambulatory Visit (INDEPENDENT_AMBULATORY_CARE_PROVIDER_SITE_OTHER): Payer: Self-pay | Admitting: Thoracic Surgery (Cardiothoracic Vascular Surgery)

## 2022-05-25 ENCOUNTER — Encounter: Payer: Self-pay | Admitting: Thoracic Surgery (Cardiothoracic Vascular Surgery)

## 2022-05-25 VITALS — BP 129/85 | HR 65 | Resp 20 | Ht 67.0 in | Wt 178.0 lb

## 2022-05-25 DIAGNOSIS — Z952 Presence of prosthetic heart valve: Secondary | ICD-10-CM

## 2022-05-25 DIAGNOSIS — Z951 Presence of aortocoronary bypass graft: Secondary | ICD-10-CM

## 2022-05-27 ENCOUNTER — Encounter: Payer: Self-pay | Admitting: Thoracic Surgery (Cardiothoracic Vascular Surgery)

## 2022-06-01 ENCOUNTER — Other Ambulatory Visit: Payer: Self-pay | Admitting: Physician Assistant

## 2022-06-01 NOTE — Telephone Encounter (Signed)
From patient.

## 2022-06-02 ENCOUNTER — Telehealth: Payer: Self-pay | Admitting: *Deleted

## 2022-06-02 ENCOUNTER — Ambulatory Visit: Payer: 59 | Admitting: Student

## 2022-06-02 NOTE — Telephone Encounter (Signed)
Patient contacted the office requesting a refill of Tramadol. Per patient, he is taking Tramadol nightly to help him sleep as well as for pain. Patient states taking a tramadol is like taking aspirin to him. Advised patient that typically after a month out of surgery patients are urged to take over the counter pain relievers for pain. Advised patient he may take Tylenol PM at this time for pain management. Patient states he is currently taking that in addition to the Tramadol at night. Encouraged patient to try taking the Tylenol PM solely to see if that helps ease his pain to allow him to sleep comfortably. Patient verbalizes understanding.

## 2022-06-09 NOTE — Telephone Encounter (Signed)
From patient.

## 2022-06-15 ENCOUNTER — Encounter: Payer: Self-pay | Admitting: Cardiology

## 2022-06-15 ENCOUNTER — Ambulatory Visit: Payer: No Typology Code available for payment source | Admitting: Cardiology

## 2022-06-15 VITALS — BP 133/75 | HR 70 | Ht 67.0 in | Wt 181.0 lb

## 2022-06-15 DIAGNOSIS — Z952 Presence of prosthetic heart valve: Secondary | ICD-10-CM

## 2022-06-15 DIAGNOSIS — I2510 Atherosclerotic heart disease of native coronary artery without angina pectoris: Secondary | ICD-10-CM

## 2022-06-15 DIAGNOSIS — Z8679 Personal history of other diseases of the circulatory system: Secondary | ICD-10-CM

## 2022-06-15 DIAGNOSIS — I10 Essential (primary) hypertension: Secondary | ICD-10-CM

## 2022-06-15 DIAGNOSIS — I428 Other cardiomyopathies: Secondary | ICD-10-CM

## 2022-06-15 MED ORDER — LOSARTAN POTASSIUM 25 MG PO TABS: 25.0000 mg | ORAL_TABLET | Freq: Every evening | ORAL | 3 refills | Status: DC

## 2022-06-15 NOTE — Progress Notes (Signed)
Primary Physician/Referring:  Leroy Caraway, MD  Patient ID: [name], male    DOB: 08-Oct-1955, 66 y.o.   MRN: [id number]  Chief Complaint  Patient presents with   Atrial Fibrillation   Hypertension   Coronary Artery Disease   Follow-up   HPI:    [name]  is a 66 y.o. Caucasian male with brain abscess diagnosed in 2012 for which he underwent craniotomy, has mild residual left-sided facial and arm weakness and seizures controlled with Kepra.  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  Also during hospital admission, patient developed severe platypnea-orthodeoxia syndrome and hence underwent successful repair of the atrial septal defect with implantation of a 28 mm CardioSEAL septal occluder on 01/11/2006.  He was also tested positive for DVT and small peripheral PE at that time.    His past medical history significant for hypertension, hyperlipidemia, OSA on CPAP, bronchial asthma, aortic root dilatation and severe aortic regurgitation and chronic stage IIIa kidney disease related to prior NSAID use.  Patient underwent aortic valve replacement using a 25 mm bioprosthetic valve and CABG x1 with SVG to PDA on 04/22/2022 by Dr. Coralie Common, post procedure complicated by A-fib with RVR which was extremely difficult to rate control, eventually started on dofetilide and underwent TEE guided direct-current cardioversion.  Is presently doing well, states that he has returned back to most of his baseline activity without much limitations.  Except for occasional episodes of wheezing from asthma states that he is doing well.  Past Medical History:  Diagnosis Date   ADHD    Anginal pain ([place])    Anxiety    Arthritis    Asthma    excerise induced   Brain abscess    Chronic renal insufficiency, stage III (moderate) (HCC)    Coronary artery disease    DVT (deep venous  thrombosis) (HCC)    Esophageal reflux    History of osteomyelitis    History of transfusion of platelets    Hx of pulmonary embolus    x 2   Hyperlipidemia    Hypertension    Hypothyroidism    Neuropathy    Patent foramen ovale    S/P  Aortic valve replacement using a 4m Inspiris Pericardial valve 04/22/2022 04/22/2022   Seizures ([facility]    Sleep apnea    Sleep study done 5-6  years ago machine set at 10   Social History   Tobacco Use   Smoking status: Never   Smokeless tobacco: Never   Tobacco comments:    Never smoke 05/11/22  Substance Use Topics   Alcohol use: No    Comment: quit 20 years ago   Marital Status: Married  ROS  Review of Systems  Cardiovascular:  Positive for dyspnea on exertion. Negative for chest pain and leg swelling.  Respiratory:  Positive for wheezing.    Objective  Blood pressure 133/75, pulse 70, height _0  (1.702 m), weight 181 lb (82.1 kg), SpO2 97 %.     06/15/2022    2:36 PM 06/15/2022    2:35 PM 06/15/2022    2:32 PM  Vitals with BMI  Height   _1   Weight   181 lbs  BMI   249.17 Systolic [id number] Diastolic 75 95 95  Pulse  70 65     Physical Exam Neck:  Vascular: No carotid bruit or JVD.  Cardiovascular:     Rate and Rhythm: Normal rate and regular rhythm.     Pulses: Intact distal pulses.     Heart sounds: Murmur heard.     Harsh midsystolic murmur is present with a grade of 2/6 at the upper right sternal border.     No gallop.  Pulmonary:     Effort: Pulmonary effort is normal.     Breath sounds: Normal breath sounds.  Abdominal:     General: Bowel sounds are normal.     Palpations: Abdomen is soft.  Musculoskeletal:     Right lower leg: No edema.     Left lower leg: No edema.    Laboratory examination:   Lab Results  Component Value Date   NA 139 05/11/2022   K 3.9 05/11/2022   CO2 24 05/11/2022   GLUCOSE 108 (H) 05/11/2022   BUN 20 05/11/2022   CREATININE 1.11 05/11/2022   CALCIUM 9.4 05/11/2022    EGFR 54 (L) 03/03/2022   GFRNONAA >60 05/11/2022    CrCl cannot be calculated (Patient's most recent lab result is older than the maximum 21 days allowed.).     Latest Ref Rng & Units 05/11/2022    2:34 PM 05/04/2022    1:14 AM 05/03/2022    1:11 AM  CMP  Glucose 70 - 99 mg/dL 108  132  105   BUN 8 - 23 mg/dL _0 Creatinine 0.61 - 1.24 mg/dL 1.11  1.33  1.46   Sodium 135 - 145 mmol/L 139  136  138   Potassium 3.5 - 5.1 mmol/L 3.9  3.9  4.1   Chloride 98 - 111 mmol/L 106  102  101   CO2 22 - 32 mmol/L _1 Calcium 8.9 - 10.3 mg/dL 9.4  8.6  8.9       Latest Ref Rng & Units 04/29/2022    5:13 AM 04/28/2022    4:39 AM 04/27/2022    3:01 AM  CBC  WBC 4.0 - 10.5 K/uL 11.7  11.2  10.0   Hemoglobin 13.0 - 17.0 g/dL 8.0  8.1  7.6   Hematocrit 39.0 - 52.0 % 24.6  23.3  22.6   Platelets 150 - 400 K/uL 338  294  234    Lipid Panel     Component Value Date/Time   CHOL 127 03/03/2022 0812   TRIG 100 03/03/2022 0812   HDL 53 03/03/2022 0812   LDLCALC 55 03/03/2022 0812   LABVLDL 19 03/03/2022 0812   External labs:   Labs 11/05/2021:  TSH normal at 2.07.  Labs 02/11/2021:  Serum glucose 91 mg, BUN 26, creatinine 1.45, EGFR 54 mL.  Total cholesterol 127, triglycerides 87, HDL 51, LDL 59.  Medications and allergies   Allergies  Allergen Reactions   Iodinated Contrast Media Anaphylaxis   Iohexol Anaphylaxis and Shortness Of Breath     Code: HIVES, Desc: hives and tachycardia last time pt received IV CM kdean 07/19/06, Onset Date: [id number]   On 9/9 pt had a ct head w/ and received 13hr premedication. Was fine after scan, but broke out in hives when he got home.  Took 161m benedryl at home PO, and was fine.    [place] Thyroid [Thyroid]     mouth swelling   Codeine     agitation   Dilantin [Phenytoin] Other (See Comments)    Destroyed Blood platelets  Heparin Hives    On going therapy    Hydrocodone     Agitation   Nuvigil [Armodafinil]     rash    Other     Follows Kosher Diet; Needs Kosher products   Shellfish Allergy Other (See Comments)    Mouth tingles   Flagyl [Metronidazole] Rash   Rocephin [Ceftriaxone] Rash     Current Outpatient Medications:    acetaminophen (TYLENOL) 500 MG tablet, Take 1-2 tablets (500-1,000 mg total) by mouth every 6 (six) hours as needed for moderate pain., Disp: 30 tablet, Rfl: 0   albuterol (PROVENTIL HFA;VENTOLIN HFA) 108 (90 BASE) MCG/ACT inhaler, Inhale 2 puffs into the lungs every 6 (six) hours as needed for wheezing or shortness of breath., Disp: , Rfl:    aspirin EC 81 MG tablet, Take 1 tablet (81 mg total) by mouth daily. Swallow whole., Disp: 30 tablet, Rfl: 12   azelastine (ASTELIN) 0.1 % nasal spray, Place 1 spray into both nostrils 2 (two) times daily as needed for allergies., Disp: , Rfl:    dapagliflozin propanediol (FARXIGA) 10 MG TABS tablet, Take 1 tablet (10 mg total) by mouth daily., Disp: 30 tablet, Rfl: 3   fluticasone-salmeterol (ADVAIR HFA) 230-21 MCG/ACT inhaler, Inhale 2 puffs into the lungs 2 (two) times daily., Disp: , Rfl:    levETIRAcetam (KEPPRA) 500 MG tablet, Take 500 mg by mouth 2 (two) times daily. , Disp: , Rfl:    levothyroxine (SYNTHROID) 50 MCG tablet, TAKE 1 TABLET BY MOUTH  DAILY (Patient taking differently: Take 50-100 mcg by mouth See admin instructions. 50 mcg daily in the morning, alternating with 100 mcg every other day), Disp: 90 tablet, Rfl: 3   losartan (COZAAR) 25 MG tablet, Take 1 tablet (25 mg total) by mouth every evening., Disp: 90 tablet, Rfl: 3   metoprolol succinate (TOPROL-XL) 100 MG 24 hr tablet, Take 1 tablet (100 mg total) by mouth 2 (two) times daily. Take with or immediately following a meal., Disp: 60 tablet, Rfl: 3   pantoprazole (PROTONIX) 40 MG tablet, Take 40 mg by mouth every other day., Disp: , Rfl:    rosuvastatin (CRESTOR) 40 MG tablet, Take 1 tablet (40 mg total) by mouth daily., Disp: 30 tablet, Rfl: 3   sertraline (ZOLOFT) 25 MG  tablet, Take 1 tablet (25 mg total) by mouth daily., Disp: 30 tablet, Rfl: 3   tamsulosin (FLOMAX) 0.4 MG CAPS capsule, Take 1 capsule (0.4 mg total) by mouth daily., Disp: 30 capsule, Rfl: 33   traMADol (ULTRAM) 50 MG tablet, Take 1 tablet (50 mg total) by mouth every 6 (six) hours as needed for severe pain., Disp: 30 tablet, Rfl: 0    Radiology:   Ultrasound of the abdomen 09/18/2020: 1. Multiple hepatic cysts, largest measuring 7.0 cm in the right hepatic lobe. 2. Two small 1.2 cm and 0.9 cm round hyperechoic lesions in the right hepatic lobe, probably hemangiomas, benign. If definitive characterization is required, consider follow-up MRI abdomen without and with contrast in 6 months. 3. Low-density lesions throughout the liver upper measuring up to 6 cm, favor cysts, but these cannot be fully characterized on this noncontrast study.   CT chest without contrast 12/24/2021: 1. 4.2 cm ascending thoracic aortic aneurysm, stable from the cardiac CT performed on 08/21/2020. Recommend annual imaging followup by CTA or MRA. This recommendation follows 2010 ACCF/AHA/AATS/ACR/ASA/SCA/SCAI/SIR/STS/SVM Guidelines for the Diagnosis and Management of Patients with Thoracic Aortic Disease. Circulation. 2010; 121: Y606-T016. Aortic aneurysm NOS (ICD10-I71.9) 2. No acute  findings. 3. Minor coronary artery calcifications and mild aortic atherosclerosis. 4. Aortic Atherosclerosis (ICD10-I70.0).  Cardiac Studies:   ASD repair 01/11/2006: 1.  Intracardiac echocardiogram. 2.  Closure of the PFO with 28 mm CardioSeal septal occluder.  Right Left Heart Catheterization 03/10/22:  RA 7/5, mean 5 mmHg RV 39/5, EDP 10 mmHg PA 36/17, mean 26 mmHg. PW 21/21, mean 18 mmHg. QP/QS 1.0.  PVR 1.70 Wood units.   LV 155/13, EDP 30 mmHg. Ao 143/69, mean 100 mmHg.  There was no pressure gradient across the aortic valve.   RCA: Anterior origin.  Dominant and continues to the PDA gives origin to very small PL  branches, proximal segment has a 70% stenosis which is focal.  Distal RCA has a tandem 30% stenosis.  Mild disease is present throughout the RCA. LM: Large caliber vessel.  No significant disease. LAD: Large vessel giving origin to a very large D1.  There is mild 20 to 30% stenosis in the proximal to mid segment of the LAD.  Mild disease in the ostium of the D1. LCx: Very large caliber vessel giving origin to a large OM1 which has secondary branches.  Circumflex continues in the AV groove, OM1 is large with mild disease.  Proximal segment has mild ectasia and a 10 to 20% stenosis.   Impression: Mild pulmonary hypertension secondary to elevated EDP, single-vessel coronary artery disease that is of significance involving the right coronary artery which is moderate-sized vessel.  Mild disease is evident in the LAD and CX.   Aortic valve replacement using a 25 mm bioprosthetic valve and CABG x1 with SVG to PDA on 04/22/2022    PCV ECHOCARDIOGRAM COMPLETE 02/04/2022  Narrative Echocardiogram 02/04/2022: Mildly depressed LV systolic function with EF 46%. Left ventricle cavity is moderate to severely dilated. Moderate concentric hypertrophy of the left ventricle. Hypokinetic global wall motion. Calculated EF 46%. Left atrial cavity is moderately dilated at 4.2 cm. Structurally normal trileaflet aortic valve.  Moderate to severe aortic regurgitation. Structurally normal mitral valve.  Mild to moderate mitral regurgitation. Structurally normal tricuspid valve.  Moderate tricuspid regurgitation. Moderate to severe pulmonary hypertension. RVSP measures 60 mmHg. Dilated pulmonary artery. IVC is dilated with respiratory variation.      EKG:   EKG 06/15/2022: Normal sinus rhythm with rate of 64 bpm, normal axis, poor R wave progression, cannot exclude anteroseptal infarct old.  Low-voltage complexes.  Consider pulmonary disease pattern.  Nonspecific T abnormality high lateral leads.  Compared to  05/13/2022, T wave inversion slightly more prominent but otherwise no significant change.  Assessment     ICD-10-CM   1. S/P  Aortic valve replacement using a 86m Inspiris Pericardial valve 04/22/2022  Z95.2 PCV ECHOCARDIOGRAM COMPLETE    2. History of atrial fibrillation  Z86.79 EKG 12-Lead    3. Coronary artery disease involving native coronary artery of native heart without angina pectoris  I25.10 EKG 12-Lead    losartan (COZAAR) 25 MG tablet    4. Primary hypertension  I10       Medications Discontinued During This Encounter  Medication Reason   dofetilide (TIKOSYN) 250 MCG capsule Completed Course   apixaban (ELIQUIS) 5 MG TABS tablet Completed Course    Meds ordered this encounter  Medications   losartan (COZAAR) 25 MG tablet    Sig: Take 1 tablet (25 mg total) by mouth every evening.    Dispense:  90 tablet    Refill:  3   Orders Placed This Encounter  Procedures   EKG  12-Lead   PCV ECHOCARDIOGRAM COMPLETE    Standing Status:   Future    Standing Expiration Date:   06/16/2023   Recommendations:   [name] is a 66 y.o. Caucasian male with brain abscess diagnosed in 2012 for which he underwent craniotomy, has mild residual left-sided facial and arm weakness and seizures controlled with Kepra.  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  Also during hospital admission, patient developed severe platypnea-orthodeoxia syndrome and hence underwent successful repair of the atrial septal defect with implantation of a 28 mm CardioSEAL septal occluder on 01/11/2006.  He was also tested positive for DVT and small peripheral PE at that time.    His past medical history significant for hypertension, hyperlipidemia, OSA on CPAP, bronchial asthma, aortic root dilatation and severe aortic regurgitation and chronic stage IIIa kidney disease related to prior NSAID use.  Patient  underwent aortic valve replacement using a 25 mm bioprosthetic valve and CABG x1 with SVG to PDA on 04/22/2022 by Dr. Coralie Common, post procedure complicated by A-fib with RVR which was extremely difficult to rate control, eventually started on dofetilide and underwent TEE guided direct-current cardioversion.  He presents for a 4-week follow-up.  States that he is doing well and fatigue has resolved, he has not resumed most of his activity with very little limitation.  Wife present.  1. S/P  Aortic valve replacement using a 23m Inspiris Pericardial valve 04/22/2022 Patient is presently doing well, except for a soft systolic ejection murmur no abnormalities heard.  I have ordered echocardiogram for follow-up.  To establish a baseline.  2. History of atrial fibrillation He is maintaining sinus rhythm, he was started on Tikosyn and also Eliquis postoperatively, his chads vascular score is low, he is maintaining sinus rhythm, he can discontinue Tikosyn and also Eliquis.  3. Coronary artery disease involving native coronary artery of native heart without angina pectoris He has very mild coronary artery disease at most moderate stenosis in the right coronary artery and has had one-vessel bypass.  He will continue with aspirin 81 mg daily.  4. Primary hypertension Blood pressure is well-controlled.  In view of coronary artery disease, aortic valve disease, I will add losartan 25 mg in the evening.    JAdrian Prows MD, [facility]12/10/2021, 5:48 PM Office: [phone]

## 2022-06-16 ENCOUNTER — Encounter: Payer: Self-pay | Admitting: Cardiology

## 2022-06-16 ENCOUNTER — Encounter: Payer: Self-pay | Admitting: Thoracic Surgery (Cardiothoracic Vascular Surgery)

## 2022-06-16 NOTE — Telephone Encounter (Signed)
From patient.

## 2022-06-21 ENCOUNTER — Encounter: Payer: Self-pay | Admitting: Cardiology

## 2022-06-22 NOTE — Telephone Encounter (Signed)
From patient.

## 2022-06-29 ENCOUNTER — Telehealth (HOSPITAL_COMMUNITY): Payer: Self-pay

## 2022-06-29 NOTE — Telephone Encounter (Signed)
Pt is not interested in the cardiac rehab. Closed referral. 

## 2022-07-07 ENCOUNTER — Encounter: Payer: Self-pay | Admitting: Cardiology

## 2022-07-08 ENCOUNTER — Encounter: Payer: Self-pay | Admitting: Cardiology

## 2022-07-08 DIAGNOSIS — I10 Essential (primary) hypertension: Secondary | ICD-10-CM

## 2022-07-08 MED ORDER — LOSARTAN POTASSIUM-HCTZ 50-12.5 MG PO TABS: 1.0000 | ORAL_TABLET | ORAL | 3 refills | Status: DC

## 2022-07-08 NOTE — Telephone Encounter (Signed)
ICD-10-CM   1. Primary hypertension  I10 losartan-hydrochlorothiazide (HYZAAR) 50-12.5 MG tablet      Meds ordered this encounter  Medications   losartan-hydrochlorothiazide (HYZAAR) 50-12.5 MG tablet    Sig: Take 1 tablet by mouth every morning.    Dispense:  30 tablet    Refill:  3    Medications Discontinued During This Encounter  Medication Reason   losartan (COZAAR) 25 MG tablet Change in therapy    Adrian Prows, MD, [facility], [facility], [facility] 07/08/2022, 4:46 PM Office: [phone] Fax: [phone] Pager: [phone]

## 2022-07-08 NOTE — Telephone Encounter (Signed)
From patient.

## 2022-07-09 NOTE — Telephone Encounter (Signed)
From patient.

## 2022-07-13 ENCOUNTER — Encounter: Payer: Self-pay | Admitting: Thoracic Surgery (Cardiothoracic Vascular Surgery)

## 2022-07-14 ENCOUNTER — Other Ambulatory Visit: Payer: Self-pay

## 2022-07-14 DIAGNOSIS — Z952 Presence of prosthetic heart valve: Secondary | ICD-10-CM

## 2022-07-14 DIAGNOSIS — Z951 Presence of aortocoronary bypass graft: Secondary | ICD-10-CM

## 2022-07-15 ENCOUNTER — Encounter: Payer: Self-pay | Admitting: Cardiology

## 2022-07-15 NOTE — Telephone Encounter (Signed)
From patient.

## 2022-07-20 ENCOUNTER — Ambulatory Visit
Admission: RE | Admit: 2022-07-20 | Discharge: 2022-07-20 | Disposition: A | Discharge status: Home / Self Care | Payer: 59 | Source: Ambulatory Visit | Attending: Thoracic Surgery (Cardiothoracic Vascular Surgery) | Admitting: Thoracic Surgery (Cardiothoracic Vascular Surgery)

## 2022-07-20 ENCOUNTER — Ambulatory Visit (INDEPENDENT_AMBULATORY_CARE_PROVIDER_SITE_OTHER): Payer: Self-pay | Admitting: Thoracic Surgery (Cardiothoracic Vascular Surgery)

## 2022-07-20 ENCOUNTER — Encounter: Payer: Self-pay | Admitting: Thoracic Surgery (Cardiothoracic Vascular Surgery)

## 2022-07-20 VITALS — BP 133/96 | HR 75 | Resp 20 | Ht 67.0 in | Wt 176.0 lb

## 2022-07-20 DIAGNOSIS — Z952 Presence of prosthetic heart valve: Secondary | ICD-10-CM

## 2022-07-20 DIAGNOSIS — Z951 Presence of aortocoronary bypass graft: Secondary | ICD-10-CM

## 2022-07-20 NOTE — Progress Notes (Signed)
[address]       [place] [address]             [phone]           [facility] Medical Record #[id number] Date of Birth: September 16, 1955  Adrian Prows, MD Cari Caraway, MD  Chief Complaint:   Follow up sternal pain  History of Present Illness:     Pt is  sp sternotomy for AVR/Cabg and following had a "popping" sensation in chest afterwards. Pt had that feeling preop but wanted to follow up after several months from surgery with CXR. Pt reports no further popping sensation. Does have right muscle "pulling: sensation and occasional substernal pain but overall better. Has complaints of ongoing weight loss and just no motivation and has to make himself do things. Has issues with draining sinuses and cough. Treated his asthma with steroids. Has seen cardiology and are planning ECHO in February      Past Medical History:  Diagnosis Date   ADHD    Anginal pain ([place])    Anxiety    Arthritis    Asthma    excerise induced   Brain abscess    Chronic renal insufficiency, stage III (moderate) (HCC)    Coronary artery disease    DVT (deep venous thrombosis) (HCC)    Esophageal reflux    History of osteomyelitis    History of transfusion of platelets    Hx of pulmonary embolus    x 2   Hyperlipidemia    Hypertension    Hypothyroidism    Neuropathy    Patent foramen ovale    S/P  Aortic valve replacement using a 62m Inspiris Pericardial valve 04/22/2022 04/22/2022   Seizures ([place]    Sleep apnea    Sleep study done 5-6  years ago machine set at 10    Past Surgical History:  Procedure Laterality Date   ANTERIOR CRUCIATE LIGAMENT REPAIR     AORTIC VALVE REPLACEMENT N/A 04/22/2022   Procedure: AORTIC VALVE REPLACEMENT (AVR) USING 25 MM INSPIRIS RESILIA  AORTIC VALVE;  Surgeon: WCoralie Common MD;  [facility]  Service: Open Heart Surgery;  Laterality: N/A;   BRAIN SURGERY     abscess drainage x 2   CARDIOVERSION N/A 04/30/2022    Procedure: CARDIOVERSION;  Surgeon: SJerline Pain MD;  [facility];  Service: Cardiovascular;  Laterality: N/A;   CARPAL TUNNEL RELEASE     CORONARY ARTERY BYPASS GRAFT N/A 04/22/2022   Procedure: CORONARY ARTERY BYPASS GRAFTING (CABG) TIMES ONE USE THE ENDOSCOPICALLY HARVESTED RIGHT GREATER SAPHENOUS VEIN;  Surgeon: WCoralie Common MD;  [facility]  Service: Open Heart Surgery;  Laterality: N/A;  Open midline sternotomy   CRANIOTOMY     CRANIOTOMY  03/30/2012   Procedure: CRANIOTOMY TUMOR EXCISION;  Surgeon: KWinfield Cunas MD;  [facility];  Service: Neurosurgery;  Laterality: Right;   craniotomy for intracranial mass.   CRANIOTOMY  03/31/2012   Procedure: CRANIOTOMY TUMOR EXCISION;  Surgeon: KWinfield Cunas MD;  [facility];  Service: Neurosurgery;  Laterality: Right;  Craniotomy for excision of mass   Deep Vein     FRACTURE SURGERY     right arm   LAPAROSCOPIC APPENDECTOMY  11/20/2011   LAPAROSCOPIC APPENDECTOMY  11/20/2011   Procedure: APPENDECTOMY LAPAROSCOPIC;  Surgeon: DHarl Bowie MD;  Location: [facility];  Service: General;  Laterality: N/A;   PATENT FORAMEN OVALE CLOSURE  Pulmonary Embolus     RIGHT/LEFT HEART CATH AND CORONARY ANGIOGRAPHY N/A 03/10/2022   Procedure: RIGHT/LEFT HEART CATH AND CORONARY ANGIOGRAPHY;  Surgeon: Adrian Prows, MD;  Location: [facility];  Service: Cardiovascular;  Laterality: N/A;   TEE WITHOUT CARDIOVERSION N/A 04/22/2022   Procedure: TRANSESOPHAGEAL ECHOCARDIOGRAM (TEE);  Surgeon: Coralie Common, MD;  Location: [facility];  Service: Open Heart Surgery;  Laterality: N/A;   TEE WITHOUT CARDIOVERSION N/A 04/30/2022   Procedure: TRANSESOPHAGEAL ECHOCARDIOGRAM (TEE);  Surgeon: Jerline Pain, MD;  [facility];  Service: Cardiovascular;  Laterality: N/A;   ULNAR TUNNEL RELEASE      Social History   Tobacco Use  Smoking Status Never  Smokeless Tobacco Never  Tobacco Comments   Never smoke 05/11/22     Social History   Substance and Sexual Activity  Alcohol Use No   Comment: quit 20 years ago    Social History   Socioeconomic History   Marital status: Married    Spouse name: Not on file   Number of children: 5   Years of education: college   Highest education level: Not on file  Occupational History    Employer: [employer]  Tobacco Use   Smoking status: Never   Smokeless tobacco: Never   Tobacco comments:    Never smoke 05/11/22  Vaping Use   Vaping Use: Never used  Substance and Sexual Activity   Alcohol use: No    Comment: quit 20 years ago   Drug use: No   Sexual activity: Never  Other Topics Concern   Not on file  Social History Narrative   Lives with  Wife   Caffeine    Social Determinants of Health   Financial Resource Strain: Not on file  Food Insecurity: No Food Insecurity (04/28/2022)   Hunger Vital Sign    Worried About Running Out of Food in the Last Year: Never true    Ran Out of Food in the Last Year: Never true  Transportation Needs: No Transportation Needs (04/28/2022)   PRAPARE - Hydrologist (Medical): No    Lack of Transportation (Non-Medical): No  Physical Activity: Not on file  Stress: Not on file  Social Connections: Not on file  Intimate Partner Violence: Not At Risk (04/28/2022)   Humiliation, Afraid, Rape, and Kick questionnaire    Fear of Current or Ex-Partner: No    Emotionally Abused: No    Physically Abused: No    Sexually Abused: No    Allergies  Allergen Reactions   Iodinated Contrast Media Anaphylaxis   Iohexol Anaphylaxis and Shortness Of Breath     Code: HIVES, Desc: hives and tachycardia last time pt received IV CM kdean 07/19/06, Onset Date: [id number]   On 9/9 pt had a ct head w/ and received 13hr premedication. Was fine after scan, but broke out in hives when he got home.  Took '100mg'$  benedryl at home PO, and was fine.    [place] Thyroid [Thyroid]     mouth swelling   Codeine      agitation   Dilantin [Phenytoin] Other (See Comments)    Destroyed Blood platelets    Heparin Hives    On going therapy    Hydrocodone     Agitation   Nuvigil [Armodafinil]     rash   Other     Follows Kosher Diet; Needs Kosher products   Shellfish Allergy Other (See Comments)    Mouth tingles   Flagyl [  Metronidazole] Rash   Rocephin [Ceftriaxone] Rash    Current Outpatient Medications  Medication Sig Dispense Refill   acetaminophen (TYLENOL) 500 MG tablet Take 1-2 tablets (500-1,000 mg total) by mouth every 6 (six) hours as needed for moderate pain. 30 tablet 0   albuterol (PROVENTIL HFA;VENTOLIN HFA) 108 (90 BASE) MCG/ACT inhaler Inhale 2 puffs into the lungs every 6 (six) hours as needed for wheezing or shortness of breath.     aspirin EC 81 MG tablet Take 1 tablet (81 mg total) by mouth daily. Swallow whole. 30 tablet 12   azelastine (ASTELIN) 0.1 % nasal spray Place 1 spray into both nostrils 2 (two) times daily as needed for allergies.     dapagliflozin propanediol (FARXIGA) 10 MG TABS tablet Take 1 tablet (10 mg total) by mouth daily. 30 tablet 3   fluticasone-salmeterol (ADVAIR HFA) 230-21 MCG/ACT inhaler Inhale 2 puffs into the lungs 2 (two) times daily.     levETIRAcetam (KEPPRA) 500 MG tablet Take 500 mg by mouth 2 (two) times daily.      levothyroxine (SYNTHROID) 50 MCG tablet TAKE 1 TABLET BY MOUTH  DAILY (Patient taking differently: Take 50-100 mcg by mouth See admin instructions. 50 mcg daily in the morning, alternating with 100 mcg every other day) 90 tablet 3   losartan-hydrochlorothiazide (HYZAAR) 50-12.5 MG tablet Take 1 tablet by mouth every morning. 30 tablet 3   metoprolol succinate (TOPROL-XL) 100 MG 24 hr tablet Take 1 tablet (100 mg total) by mouth 2 (two) times daily. Take with or immediately following a meal. 60 tablet 3   pantoprazole (PROTONIX) 40 MG tablet Take 40 mg by mouth every other day.     rosuvastatin (CRESTOR) 40 MG tablet Take 1 tablet (40 mg  total) by mouth daily. 30 tablet 3   sertraline (ZOLOFT) 25 MG tablet Take 1 tablet (25 mg total) by mouth daily. 30 tablet 3   tamsulosin (FLOMAX) 0.4 MG CAPS capsule Take 1 capsule (0.4 mg total) by mouth daily. 30 capsule 33   traMADol (ULTRAM) 50 MG tablet Take 1 tablet (50 mg total) by mouth every 6 (six) hours as needed for severe pain. 30 tablet 0   No current facility-administered medications for this visit.     Family History  Problem Relation Age of Onset   Bladder Cancer Mother    Stroke Father    Thyroid disease Sister    Diabetes Brother    Allergies Other        whole family   Asthma Other        whole family       Physical Exam: Lungs: clear Sternum: incision clean withouth drainage. Sternum stable     Diagnostic Studies & Laboratory data: I have personally reviewed the following studies and agree with the findings     Recent Radiology Findings:   No results found.    Recent Lab Findings: Lab Results  Component Value Date   WBC 11.7 (H) 04/29/2022   HGB 8.0 (L) 04/29/2022   HCT 24.6 (L) 04/29/2022   PLT 338 04/29/2022   GLUCOSE 108 (H) 05/11/2022   CHOL 127 03/03/2022   TRIG 100 03/03/2022   HDL 53 03/03/2022   LDLCALC 55 03/03/2022   ALT 17 04/20/2022   AST 22 04/20/2022   NA 139 05/11/2022   K 3.9 05/11/2022   CL 106 05/11/2022   CREATININE 1.11 05/11/2022   BUN 20 05/11/2022   CO2 24 05/11/2022   TSH 7.440 (H) 04/27/2022  INR 1.4 (H) 04/29/2022   HGBA1C 5.2 04/20/2022      Assessment / Plan:     Sp sternotomy with postoperative popping that has since resolved CXR without change in wires or lung fields We discussed moving forward with no restrictions and to see how chest feels after he resumes his work out routine Follow up prn   I have spent 20 min in review of the records, viewing studies and in face to face with patient and in coordination of future care    Coralie Common 07/20/2022 11:36 AM

## 2022-07-30 NOTE — Telephone Encounter (Signed)
From patient

## 2022-07-31 ENCOUNTER — Ambulatory Visit: Payer: 59 | Admitting: Cardiology

## 2022-07-31 ENCOUNTER — Telehealth: Payer: Self-pay

## 2022-07-31 ENCOUNTER — Encounter: Payer: Self-pay | Admitting: Cardiology

## 2022-07-31 VITALS — BP 117/73 | HR 86 | Ht 67.0 in | Wt 178.0 lb

## 2022-07-31 DIAGNOSIS — I480 Paroxysmal atrial fibrillation: Secondary | ICD-10-CM

## 2022-07-31 DIAGNOSIS — Z952 Presence of prosthetic heart valve: Secondary | ICD-10-CM

## 2022-07-31 DIAGNOSIS — I2510 Atherosclerotic heart disease of native coronary artery without angina pectoris: Secondary | ICD-10-CM

## 2022-07-31 DIAGNOSIS — G4733 Obstructive sleep apnea (adult) (pediatric): Secondary | ICD-10-CM

## 2022-07-31 MED ORDER — APIXABAN 5 MG PO TABS: 5.0000 mg | ORAL_TABLET | Freq: Two times a day (BID) | ORAL | 2 refills | Status: DC

## 2022-07-31 MED ORDER — DILTIAZEM HCL ER COATED BEADS 180 MG PO CP24: 180.0000 mg | ORAL_CAPSULE | Freq: Every day | ORAL | 2 refills | Status: DC

## 2022-07-31 NOTE — Telephone Encounter (Signed)
Patient called to say that his wife's Apple watch shows that he is in AFib.Marland Kitchen He is having heart palpitations and pulse is erratic. States his bp has been in the normal range.

## 2022-07-31 NOTE — Progress Notes (Signed)
Primary Physician/Referring:  Cari Caraway, MD  Patient ID: [name], male    DOB: [birth year], 67 y.o.   MRN: [id number]  Chief Complaint  Patient presents with   Atrial Fibrillation   Shortness of Breath   HPI:    [name]  is a 67 y.o. Caucasian male with brain abscess diagnosed in 2012 for which he underwent craniotomy, has mild residual left-sided facial and arm weakness and seizures controlled with Kepra.  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  Also during hospital admission, patient developed severe platypnea-orthodeoxia syndrome and hence underwent successful repair of the atrial septal defect with implantation of a 28 mm CardioSEAL septal occluder on 01/11/2006.  He was also tested positive for DVT and small peripheral PE at that time.    His past medical history significant for hypertension, hyperlipidemia, OSA on CPAP, bronchial asthma, aortic root dilatation and severe aortic regurgitation and chronic stage IIIa kidney disease related to prior NSAID use.  Patient underwent aortic valve replacement using a 25 mm bioprosthetic valve and CABG x1 with SVG to PDA on 04/22/2022 by Dr. Coralie Common, post procedure complicated by A-fib with RVR which was extremely difficult to rate control, eventually started on dofetilide and underwent TEE guided direct-current cardioversion.  Patient called this afternoon stating that he is in atrial fibrillation.  I made a urgent appointment to see him in the office as it is a weekend today being Friday, 07/31/2022.  Patient states that about 2 days ago he felt rapid heartbeat but only lasted a few minutes.  He had felt well and in fact has been exercising regularly, this morning went into atrial fibrillation and he has been feeling palpitations and discomfort in his chest and felt extremely nervous and also felt tired.  Presently no  chest pain, no dyspnea, accompanied by his wife.  Past Medical History:  Diagnosis Date   ADHD    Anginal pain ([place])    Anxiety    Arthritis    Asthma    excerise induced   Brain abscess    Chronic renal insufficiency, stage III (moderate) (HCC)    Coronary artery disease    DVT (deep venous thrombosis) (HCC)    Esophageal reflux    History of osteomyelitis    History of transfusion of platelets    Hx of pulmonary embolus    x 2   Hyperlipidemia    Hypertension    Hypothyroidism    Neuropathy    Patent foramen ovale    S/P  Aortic valve replacement using a 32m Inspiris Pericardial valve 04/22/2022 04/22/2022   Seizures [place]    Sleep apnea    Sleep study done 5-6  years ago machine set at 10   Social History   Tobacco Use   Smoking status: Never   Smokeless tobacco: Never   Tobacco comments:    Never smoke 05/11/22  Substance Use Topics   Alcohol use: No    Comment: quit 20 years ago   Marital Status: Married  ROS  Review of Systems  Cardiovascular:  Positive for irregular heartbeat. Negative for chest pain, dyspnea on exertion and leg swelling.  Respiratory:  Positive for wheezing.    Objective  Blood pressure 117/73, pulse 86, height '5\' 7"'$  (1.702 m), weight 178 lb (80.7 kg), SpO2 98 %.     07/31/2022  2:31 PM 07/20/2022   12:40 PM 06/15/2022    2:36 PM  Vitals with BMI  Height '5\' 7"'$  '5\' 7"'$    Weight 178 lbs 176 lbs   BMI 52.77 82.42   Systolic 353 614 431  Diastolic 73 96 75  Pulse 86 75      Physical Exam Neck:     Vascular: No carotid bruit or JVD.  Cardiovascular:     Rate and Rhythm: Tachycardia present. Rhythm irregular.     Pulses: Intact distal pulses.     Heart sounds: Murmur heard.     Harsh midsystolic murmur is present with a grade of 2/6 at the upper right sternal border.     No gallop.  Pulmonary:     Effort: Pulmonary effort is normal.     Breath sounds: Normal breath sounds.  Abdominal:     General: Bowel sounds are normal.      Palpations: Abdomen is soft.  Musculoskeletal:     Right lower leg: No edema.     Left lower leg: No edema.    Laboratory examination:   Lab Results  Component Value Date   NA 139 05/11/2022   K 3.9 05/11/2022   CO2 24 05/11/2022   GLUCOSE 108 (H) 05/11/2022   BUN 20 05/11/2022   CREATININE 1.11 05/11/2022   CALCIUM 9.4 05/11/2022   EGFR 54 (L) 03/03/2022   GFRNONAA >60 05/11/2022    CrCl cannot be calculated (Patient's most recent lab result is older than the maximum 21 days allowed.).     Latest Ref Rng & Units 05/11/2022    2:34 PM 05/04/2022    1:14 AM 05/03/2022    1:11 AM  CMP  Glucose 70 - 99 mg/dL 108  132  105   BUN 8 - 23 mg/dL '20  20  19   '$ Creatinine 0.61 - 1.24 mg/dL 1.11  1.33  1.46   Sodium 135 - 145 mmol/L 139  136  138   Potassium 3.5 - 5.1 mmol/L 3.9  3.9  4.1   Chloride 98 - 111 mmol/L 106  102  101   CO2 22 - 32 mmol/L '24  26  25   '$ Calcium 8.9 - 10.3 mg/dL 9.4  8.6  8.9       Latest Ref Rng & Units 04/29/2022    5:13 AM 04/28/2022    4:39 AM 04/27/2022    3:01 AM  CBC  WBC 4.0 - 10.5 K/uL 11.7  11.2  10.0   Hemoglobin 13.0 - 17.0 g/dL 8.0  8.1  7.6   Hematocrit 39.0 - 52.0 % 24.6  23.3  22.6   Platelets 150 - 400 K/uL 338  294  234    Lipid Panel     Component Value Date/Time   CHOL 127 03/03/2022 0812   TRIG 100 03/03/2022 0812   HDL 53 03/03/2022 0812   LDLCALC 55 03/03/2022 0812   LABVLDL 19 03/03/2022 0812   External labs:   Labs 11/05/2021:  TSH normal at 2.07.  Labs 02/11/2021:  Serum glucose 91 mg, BUN 26, creatinine 1.45, EGFR 54 mL.  Total cholesterol 127, triglycerides 87, HDL 51, LDL 59.  Medications and allergies   Allergies  Allergen Reactions   Iodinated Contrast Media Anaphylaxis   Iohexol Anaphylaxis and Shortness Of Breath     Code: HIVES, Desc: hives and tachycardia last time pt received IV CM kdean 07/19/06, Onset Date: [id number]   On 9/9 pt had a ct head w/  and received 13hr premedication. Was fine after scan,  but broke out in hives when he got home.  Took '100mg'$  benedryl at home PO, and was fine.    [place] Thyroid [Thyroid]     mouth swelling   Codeine     agitation   Dilantin [Phenytoin] Other (See Comments)    Destroyed Blood platelets    Heparin Hives    On going therapy    Hydrocodone     Agitation   Nuvigil [Armodafinil]     rash   Other     Follows Kosher Diet; Needs Kosher products   Shellfish Allergy Other (See Comments)    Mouth tingles   Flagyl [Metronidazole] Rash   Rocephin [Ceftriaxone] Rash     Current Outpatient Medications:    acetaminophen (TYLENOL) 500 MG tablet, Take 1-2 tablets (500-1,000 mg total) by mouth every 6 (six) hours as needed for moderate pain., Disp: 30 tablet, Rfl: 0   albuterol (PROVENTIL HFA;VENTOLIN HFA) 108 (90 BASE) MCG/ACT inhaler, Inhale 2 puffs into the lungs every 6 (six) hours as needed for wheezing or shortness of breath., Disp: , Rfl:    apixaban (ELIQUIS) 5 MG TABS tablet, Take 1 tablet (5 mg total) by mouth 2 (two) times daily., Disp: 60 tablet, Rfl: 2   aspirin EC 81 MG tablet, Take 1 tablet (81 mg total) by mouth daily. Swallow whole., Disp: 30 tablet, Rfl: 12   azelastine (ASTELIN) 0.1 % nasal spray, Place 1 spray into both nostrils 2 (two) times daily as needed for allergies., Disp: , Rfl:    dapagliflozin propanediol (FARXIGA) 10 MG TABS tablet, Take 1 tablet (10 mg total) by mouth daily., Disp: 30 tablet, Rfl: 3   diltiazem (CARDIZEM CD) 180 MG 24 hr capsule, Take 1 capsule (180 mg total) by mouth daily., Disp: 30 capsule, Rfl: 2   fluticasone-salmeterol (ADVAIR HFA) 230-21 MCG/ACT inhaler, Inhale 2 puffs into the lungs 2 (two) times daily., Disp: , Rfl:    levETIRAcetam (KEPPRA) 500 MG tablet, Take 500 mg by mouth 2 (two) times daily. , Disp: , Rfl:    levothyroxine (SYNTHROID) 50 MCG tablet, TAKE 1 TABLET BY MOUTH  DAILY (Patient taking differently: Take 50-100 mcg by mouth See admin instructions. 50 mcg daily in the morning,  alternating with 100 mcg every other day), Disp: 90 tablet, Rfl: 3   metoprolol succinate (TOPROL-XL) 100 MG 24 hr tablet, Take 1 tablet (100 mg total) by mouth 2 (two) times daily. Take with or immediately following a meal. (Patient taking differently: Take 100 mg by mouth daily. Take with or immediately following a meal.), Disp: 60 tablet, Rfl: 3   pantoprazole (PROTONIX) 40 MG tablet, Take 40 mg by mouth every other day., Disp: , Rfl:    rosuvastatin (CRESTOR) 40 MG tablet, Take 1 tablet (40 mg total) by mouth daily., Disp: 30 tablet, Rfl: 3   sertraline (ZOLOFT) 25 MG tablet, Take 1 tablet (25 mg total) by mouth daily., Disp: 30 tablet, Rfl: 3   tamsulosin (FLOMAX) 0.4 MG CAPS capsule, Take 1 capsule (0.4 mg total) by mouth daily., Disp: 30 capsule, Rfl: 33    Radiology:   Ultrasound of the abdomen 09/18/2020: 1. Multiple hepatic cysts, largest measuring 7.0 cm in the right hepatic lobe. 2. Two small 1.2 cm and 0.9 cm round hyperechoic lesions in the right hepatic lobe, probably hemangiomas, benign. If definitive characterization is required, consider follow-up MRI abdomen without and with contrast in 6 months. 3. Low-density lesions throughout the  liver upper measuring up to 6 cm, favor cysts, but these cannot be fully characterized on this noncontrast study.   CT chest without contrast 12/24/2021: 1. 4.2 cm ascending thoracic aortic aneurysm, stable from the cardiac CT performed on 08/21/2020. Recommend annual imaging followup by CTA or MRA. This recommendation follows 2010 ACCF/AHA/AATS/ACR/ASA/SCA/SCAI/SIR/STS/SVM Guidelines for the Diagnosis and Management of Patients with Thoracic Aortic Disease. Circulation. 2010; 121: D622-W979. Aortic aneurysm NOS (ICD10-I71.9) 2. No acute findings. 3. Minor coronary artery calcifications and mild aortic atherosclerosis. 4. Aortic Atherosclerosis (ICD10-I70.0).  Cardiac Studies:   ASD repair 01/11/2006: 1.  Intracardiac echocardiogram. 2.   Closure of the PFO with 28 mm CardioSeal septal occluder.  Right Left Heart Catheterization 03/10/22:  RA 7/5, mean 5 mmHg RV 39/5, EDP 10 mmHg PA 36/17, mean 26 mmHg. PW 21/21, mean 18 mmHg. QP/QS 1.0.  PVR 1.70 Wood units.   LV 155/13, EDP 30 mmHg. Ao 143/69, mean 100 mmHg.  There was no pressure gradient across the aortic valve.   RCA: Anterior origin.  Dominant and continues to the PDA gives origin to very small PL branches, proximal segment has a 70% stenosis which is focal.  Distal RCA has a tandem 30% stenosis.  Mild disease is present throughout the RCA. LM: Large caliber vessel.  No significant disease. LAD: Large vessel giving origin to a very large D1.  There is mild 20 to 30% stenosis in the proximal to mid segment of the LAD.  Mild disease in the ostium of the D1. LCx: Very large caliber vessel giving origin to a large OM1 which has secondary branches.  Circumflex continues in the AV groove, OM1 is large with mild disease.  Proximal segment has mild ectasia and a 10 to 20% stenosis.   Impression: Mild pulmonary hypertension secondary to elevated EDP, single-vessel coronary artery disease that is of significance involving the right coronary artery which is moderate-sized vessel.  Mild disease is evident in the LAD and CX.   Aortic valve replacement using a 25 mm bioprosthetic valve and CABG x1 with SVG to PDA on 04/22/2022    [facility]V ECHOCARDIOGRAM COMPLETE 02/04/2022  Narrative Echocardiogram 02/04/2022: Mildly depressed LV systolic function with EF 46%. Left ventricle cavity is moderate to severely dilated. Moderate concentric hypertrophy of the left ventricle. Hypokinetic global wall motion. Calculated EF 46%. Left atrial cavity is moderately dilated at 4.2 cm. Structurally normal trileaflet aortic valve.  Moderate to severe aortic regurgitation. Structurally normal mitral valve.  Mild to moderate mitral regurgitation. Structurally normal tricuspid valve.  Moderate tricuspid  regurgitation. Moderate to severe pulmonary hypertension. RVSP measures 60 mmHg. Dilated pulmonary artery. IVC is dilated with respiratory variation.      EKG:   EKG 07/31/2022: Atrial fibrillation with rapid ventricular response at rate of 160 bpm, normal axis, diffuse nonspecific ST-T abnormality.   EKG 06/15/2022: Normal sinus rhythm with rate of 64 bpm, normal axis, poor R wave progression, cannot exclude anteroseptal infarct old.  Low-voltage complexes.  Consider pulmonary disease pattern.  Nonspecific T abnormality high lateral leads.  Compared to 05/13/2022, T wave inversion slightly more prominent but otherwise no significant change.  Assessment     ICD-10-CM   1. Paroxysmal atrial fibrillation (HCC)  I48.0 EKG 12-Lead    apixaban (ELIQUIS) 5 MG TABS tablet    diltiazem (CARDIZEM CD) 180 MG 24 hr capsule    Basic metabolic panel    CBC    2. S/P  Aortic valve replacement using a 59m Inspiris Pericardial valve 04/22/2022  Z95.2     3. Coronary artery disease involving native coronary artery of native heart without angina pectoris  I25.10 Lipid Panel With LDL/HDL Ratio    4. OSA on CPAP  G47.33 Ambulatory referral to Pulmonology      Medications Discontinued During This Encounter  Medication Reason   losartan-hydrochlorothiazide (HYZAAR) 50-12.5 MG tablet Discontinued by provider   traMADol (ULTRAM) 50 MG tablet Completed Course    Meds ordered this encounter  Medications   apixaban (ELIQUIS) 5 MG TABS tablet    Sig: Take 1 tablet (5 mg total) by mouth 2 (two) times daily.    Dispense:  60 tablet    Refill:  2   diltiazem (CARDIZEM CD) 180 MG 24 hr capsule    Sig: Take 1 capsule (180 mg total) by mouth daily.    Dispense:  30 capsule    Refill:  2   Orders Placed This Encounter  Procedures   Basic metabolic panel   CBC   Lipid Panel With LDL/HDL Ratio   Ambulatory referral to Pulmonology    Referral Priority:   Routine    Referral Type:   Consultation     Referral Reason:   Specialty Services Required    Requested Specialty:   Pulmonary Disease    Number of Visits Requested:   1   EKG 12-Lead   Recommendations:   [name] is a 67 y.o. Caucasian male with brain abscess diagnosed in 2012 for which he underwent craniotomy, has mild residual left-sided facial and arm weakness and seizures controlled with Kepra.  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  Also during hospital admission, patient developed severe platypnea-orthodeoxia syndrome and hence underwent successful repair of the atrial septal defect with implantation of a 28 mm CardioSEAL septal occluder on 01/11/2006.  He was also tested positive for DVT and small peripheral PE at that time.    His past medical history significant for hypertension, hyperlipidemia, OSA on CPAP, bronchial asthma, aortic root dilatation and severe aortic regurgitation and chronic stage IIIa kidney disease related to prior NSAID use.  Patient underwent aortic valve replacement using a 25 mm bioprosthetic valve and CABG x1 with SVG to PDA on 04/22/2022 by Dr. Coralie Common, post procedure complicated by A-fib with RVR which was extremely difficult to rate control, eventually started on dofetilide and underwent TEE guided direct-current cardioversion.    Wife present.  1. Paroxysmal atrial fibrillation [facility]) Patient called me this afternoon stating that he is in atrial fibrillation by "Apple Watch", EKG confirms A-fib with RVR.  I had just seen him a month ago, and after him being on anticoagulation with Eliquis for 2 months and dofetilide for 2 months, I discontinued this as his CHA2DS2-VASc score was low and replaced postop atrial fibrillation.  He now has recurrence of atrial fibrillation.  Start him back on Eliquis, symptoms started this afternoon, <24 hours.  Patient also states that about 2 days ago he had  brief episode of palpitation and he thought he was in atrial fibrillation.  He is presently on metoprolol succinate 100 mg daily, will add diltiazem CD1 80 mg daily as well for rate control.  I will schedule him for direct-current cardioversion on Tuesday, 08/04/2022.  He will let me know if he converts back into sinus rhythm.  I will obtain labs including lipids along with his precardioversion labs.  2. S/P  Aortic valve replacement using a 9m Inspiris Pericardial valve 04/22/2022 Patient is SP aortic valve replacement on 04/22/2022.  No change in his physical exam, no clinical evidence of heart failure.  3. Coronary artery disease involving native coronary artery of native heart without angina pectoris He has not had any chest pain or angina and spite of A-fib with RVR.  Reassured the patient and his wife.  4. OSA on CPAP Patient is presently on CPAP, he had last seen Dr. CBaird Lyonsseveral years ago.  This could also be have an effect and A-fib recurrence, referral made to pulmonary medicine for evaluation of sleep apnea.  This is a 40-minute office visit encounter.    JAdrian Prows MD, FPosada Amb[facility] Surgery [facility] LP1/19/2024, 9:26 PM Office: [phone]

## 2022-08-01 LAB — CBC
Hematocrit: 39.3 % (ref 37.5–51.0)
Hemoglobin: 12.9 g/dL — ABNORMAL LOW (ref 13.0–17.7)
MCH: 27.8 pg (ref 26.6–33.0)
MCHC: 32.8 g/dL (ref 31.5–35.7)
MCV: 85 fL (ref 79–97)
Platelets: 497 10*3/uL — ABNORMAL HIGH (ref 150–450)
RBC: 4.64 x10E6/uL (ref 4.14–5.80)
RDW: 13.8 % (ref 11.6–15.4)
WBC: 12.6 10*3/uL — ABNORMAL HIGH (ref 3.4–10.8)

## 2022-08-01 LAB — BASIC METABOLIC PANEL
BUN/Creatinine Ratio: 17 (ref 10–24)
BUN: 18 mg/dL (ref 8–27)
CO2: 22 mmol/L (ref 20–29)
Calcium: 9.5 mg/dL (ref 8.6–10.2)
Chloride: 104 mmol/L (ref 96–106)
Creatinine, Ser: 1.06 mg/dL (ref 0.76–1.27)
Glucose: 109 mg/dL — ABNORMAL HIGH (ref 70–99)
Potassium: 3.5 mmol/L (ref 3.5–5.2)
Sodium: 142 mmol/L (ref 134–144)
eGFR: 77 mL/min/{1.73_m2} (ref >59)

## 2022-08-01 LAB — LIPID PANEL WITH LDL/HDL RATIO
Cholesterol, Total: 113 mg/dL (ref 100–199)
HDL: 43 mg/dL (ref >39)
LDL Chol Calc (NIH): 45 mg/dL (ref 0–99)
LDL/HDL Ratio: 1 ratio (ref 0.0–3.6)
Triglycerides: 142 mg/dL (ref 0–149)
VLDL Cholesterol Cal: 25 mg/dL (ref 5–40)

## 2022-08-02 MED ORDER — SOTALOL HCL 80 MG PO TABS: 80.0000 mg | ORAL_TABLET | Freq: Two times a day (BID) | ORAL | 1 refills | Status: DC

## 2022-08-02 NOTE — Telephone Encounter (Signed)
ICD-10-CM   1. Paroxysmal atrial fibrillation (HCC)  I48.0 sotalol (BETAPACE) 80 MG tablet     Meds ordered this encounter  Medications   sotalol (BETAPACE) 80 MG tablet    Sig: Take 1 tablet (80 mg total) by mouth 2 (two) times daily.    Dispense:  60 tablet    Refill:  1    Will have patient come into the office for therapeutic drug monitoring and start Sotalol in the office with observation.

## 2022-08-03 NOTE — Telephone Encounter (Signed)
Patient needs appointment for sotalol monitoring and cancel his cardioversion per [facility]

## 2022-08-04 ENCOUNTER — Ambulatory Visit (HOSPITAL_COMMUNITY): Admission: RE | Admit: 2022-08-04 | Payer: 59 | Source: Home / Self Care | Admitting: Cardiology

## 2022-08-04 ENCOUNTER — Encounter (HOSPITAL_COMMUNITY): Admission: RE | Payer: Self-pay | Source: Home / Self Care

## 2022-08-04 SURGERY — CARDIOVERSION
Anesthesia: General

## 2022-08-10 ENCOUNTER — Other Ambulatory Visit: Payer: Self-pay

## 2022-08-10 ENCOUNTER — Encounter: Payer: Self-pay | Admitting: Cardiology

## 2022-08-10 ENCOUNTER — Encounter: Payer: Self-pay | Admitting: Thoracic Surgery (Cardiothoracic Vascular Surgery)

## 2022-08-11 ENCOUNTER — Other Ambulatory Visit: Payer: Self-pay | Admitting: Cardiology

## 2022-08-11 MED ORDER — SERTRALINE HCL 25 MG PO TABS: 25.0000 mg | ORAL_TABLET | Freq: Every day | ORAL | 3 refills | Status: DC

## 2022-08-11 MED ORDER — TAMSULOSIN HCL 0.4 MG PO CAPS: 0.4000 mg | ORAL_CAPSULE | Freq: Every day | ORAL | 33 refills | Status: DC

## 2022-08-11 MED ORDER — METOPROLOL SUCCINATE ER 100 MG PO TB24: 100.0000 mg | ORAL_TABLET | Freq: Every day | ORAL | 3 refills | Status: DC

## 2022-08-11 MED ORDER — TAMSULOSIN HCL 0.4 MG PO CAPS: 0.4000 mg | ORAL_CAPSULE | Freq: Every day | ORAL | 0 refills | Status: DC

## 2022-08-11 MED ORDER — ROSUVASTATIN CALCIUM 40 MG PO TABS: 40.0000 mg | ORAL_TABLET | Freq: Every day | ORAL | 3 refills | Status: DC

## 2022-08-11 MED ORDER — DAPAGLIFLOZIN PROPANEDIOL 10 MG PO TABS: 10.0000 mg | ORAL_TABLET | Freq: Every day | ORAL | 3 refills | Status: DC

## 2022-08-11 MED ORDER — SERTRALINE HCL 25 MG PO TABS: 25.0000 mg | ORAL_TABLET | Freq: Every day | ORAL | 0 refills | Status: DC

## 2022-08-11 NOTE — Telephone Encounter (Signed)
Meds ordered this encounter  Medications   dapagliflozin propanediol (FARXIGA) 10 MG TABS tablet    Sig: Take 1 tablet (10 mg total) by mouth daily.    Dispense:  30 tablet    Refill:  3   sertraline (ZOLOFT) 25 MG tablet    Sig: Take 1 tablet (25 mg total) by mouth daily.    Dispense:  30 tablet    Refill:  3   tamsulosin (FLOMAX) 0.4 MG CAPS capsule    Sig: Take 1 capsule (0.4 mg total) by mouth daily.    Dispense:  30 capsule    Refill:  33   rosuvastatin (CRESTOR) 40 MG tablet    Sig: Take 1 tablet (40 mg total) by mouth daily.    Dispense:  30 tablet    Refill:  3   metoprolol succinate (TOPROL-XL) 100 MG 24 hr tablet    Sig: Take 1 tablet (100 mg total) by mouth daily. Take with or immediately following a meal.    Dispense:  90 tablet    Refill:  3

## 2022-08-11 NOTE — Progress Notes (Signed)
Meds ordered this encounter  Medications   rosuvastatin (CRESTOR) 40 MG tablet    Sig: Take 1 tablet (40 mg total) by mouth daily.    Dispense:  90 tablet    Refill:  3   sertraline (ZOLOFT) 25 MG tablet    Sig: Take 1 tablet (25 mg total) by mouth daily.    Dispense:  90 tablet    Refill:  0   tamsulosin (FLOMAX) 0.4 MG CAPS capsule    Sig: Take 1 capsule (0.4 mg total) by mouth daily.    Dispense:  90 capsule    Refill:  0

## 2022-08-20 ENCOUNTER — Ambulatory Visit: Payer: No Typology Code available for payment source | Admitting: Cardiology

## 2022-08-20 ENCOUNTER — Encounter: Payer: Self-pay | Admitting: Cardiology

## 2022-08-20 VITALS — BP 140/90 | HR 60 | Resp 16 | Ht 67.0 in | Wt 179.8 lb

## 2022-08-20 DIAGNOSIS — I428 Other cardiomyopathies: Secondary | ICD-10-CM

## 2022-08-20 DIAGNOSIS — E785 Hyperlipidemia, unspecified: Secondary | ICD-10-CM

## 2022-08-20 DIAGNOSIS — I2510 Atherosclerotic heart disease of native coronary artery without angina pectoris: Secondary | ICD-10-CM

## 2022-08-20 DIAGNOSIS — I480 Paroxysmal atrial fibrillation: Secondary | ICD-10-CM

## 2022-08-20 DIAGNOSIS — I10 Essential (primary) hypertension: Secondary | ICD-10-CM

## 2022-08-20 MED ORDER — IRBESARTAN 150 MG PO TABS: 150.0000 mg | ORAL_TABLET | Freq: Every evening | ORAL | 3 refills | Status: DC

## 2022-08-20 MED ORDER — ROSUVASTATIN CALCIUM 20 MG PO TABS: 20.0000 mg | ORAL_TABLET | Freq: Every day | ORAL | Status: AC

## 2022-08-20 NOTE — Progress Notes (Signed)
Primary Physician/Referring:  Leroy Caraway, MD  Patient ID: [name], male    DOB: [birth year], 67 y.o.   MRN: [id number]  Chief Complaint  Patient presents with   Atrial Fibrillation   Post Cardioversion   Non-ischemic cardiomyopathy   Follow-up    4 weeks    HPI:    [name]  is a 67 y.o. Caucasian male with brain abscess diagnosed in 2012 for which he underwent craniotomy, has mild residual left-sided facial and arm weakness and seizures controlled with Kepra.  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  Also during hospital admission, patient developed severe platypnea-orthodeoxia syndrome and hence underwent successful repair of the atrial septal defect with implantation of a 28 mm CardioSEAL septal occluder on 01/11/2006.  He was also tested positive for DVT and small peripheral PE at that time.    His past medical history significant for hypertension, hyperlipidemia, OSA on CPAP, bronchial asthma, aortic root dilatation and severe aortic regurgitation and chronic stage IIIa kidney disease related to prior NSAID use.  Patient underwent aortic valve replacement using a 25 mm bioprosthetic valve and CABG x1 with SVG to PDA on 04/22/2022 by Dr. Coralie Common, post op A-fib. He had recurrence of atrial fibrillation on 06/15/2022.  He presented with acute visit on 06/15/2022 with rapid palpitations and fatigue, I started him on sotalol and he was supposed to have cardioversion however he went back into sinus rhythm.  He is presently on Eliquis.  He now presents for follow-up  Past Medical History:  Diagnosis Date   ADHD    Anginal pain ([place])    Anxiety    Arthritis    Asthma    excerise induced   Brain abscess    Chronic renal insufficiency, stage III (moderate) (HCC)    Coronary artery disease    DVT (deep venous thrombosis) (HCC)    Esophageal reflux    History  of osteomyelitis    History of transfusion of platelets    Hx of pulmonary embolus    x 2   Hyperlipidemia    Hypertension    Hypothyroidism    Neuropathy    Patent foramen ovale    S/P  Aortic valve replacement using a 21m Inspiris Pericardial valve 04/22/2022 04/22/2022   Seizures ([facility]    Sleep apnea    Sleep study done 5-6  years ago machine set at 10   Social History   Tobacco Use   Smoking status: Never   Smokeless tobacco: Never   Tobacco comments:    Never smoke 05/11/22  Substance Use Topics   Alcohol use: No    Comment: quit 20 years ago   Marital Status: Married  ROS  Review of Systems  Cardiovascular:  Positive for irregular heartbeat. Negative for chest pain, dyspnea on exertion and leg swelling.  Respiratory:  Positive for wheezing.    Objective  Blood pressure (!) 140/90, pulse 60, resp. rate 16, height 5' 7"$  (1.702 m), weight 179 lb 12.8 oz (81.6 kg), SpO2 97 %.     08/20/2022    2:35 PM 07/31/2022    2:31 PM 07/20/2022   12:40 PM  Vitals with BMI  Height 5' 7"$  5' 7"$  5' 7"$   Weight 179 lbs 13 oz 178 lbs 176 lbs  BMI 28.15 [id number] Systolic [id number] Diastolic 90 73 96  Pulse 60 86 75     Physical Exam Neck:     Vascular: No carotid bruit or JVD.  Cardiovascular:     Rate and Rhythm: Normal rate and regular rhythm.     Pulses: Intact distal pulses.     Heart sounds: Murmur heard.     Harsh midsystolic murmur is present with a grade of 2/6 at the upper right sternal border.     No gallop.  Pulmonary:     Effort: Pulmonary effort is normal.     Breath sounds: Normal breath sounds.  Abdominal:     General: Bowel sounds are normal.     Palpations: Abdomen is soft.  Musculoskeletal:     Right lower leg: No edema.     Left lower leg: No edema.    Laboratory examination:   Lab Results  Component Value Date   NA 142 07/31/2022   K 3.5 07/31/2022   CO2 22 07/31/2022   GLUCOSE 109 (H) 07/31/2022   BUN 18 07/31/2022   CREATININE 1.06  07/31/2022   CALCIUM 9.5 07/31/2022   EGFR 77 07/31/2022   GFRNONAA >60 05/11/2022       Latest Ref Rng & Units 07/31/2022    3:45 PM 05/11/2022    2:34 PM 05/04/2022    1:14 AM  CMP  Glucose 70 - 99 mg/dL 109  108  132   BUN 8 - 27 mg/dL 18  20  20   $ Creatinine 0.76 - 1.27 mg/dL 1.06  1.11  1.33   Sodium 134 - 144 mmol/L 142  139  136   Potassium 3.5 - 5.2 mmol/L 3.5  3.9  3.9   Chloride 96 - 106 mmol/L 104  106  102   CO2 20 - 29 mmol/L 22  24  26   $ Calcium 8.6 - 10.2 mg/dL 9.5  9.4  8.6       Latest Ref Rng & Units 07/31/2022    3:45 PM 04/29/2022    5:13 AM 04/28/2022    4:39 AM  CBC  WBC 3.4 - 10.8 x10E3/uL 12.6  11.7  11.2   Hemoglobin 13.0 - 17.7 g/dL 12.9  8.0  8.1   Hematocrit 37.5 - 51.0 % 39.3  24.6  23.3   Platelets 150 - 450 x10E3/uL 497  338  294    Lipid Panel     Component Value Date/Time   CHOL 113 07/31/2022 1545   TRIG 142 07/31/2022 1545   HDL 43 07/31/2022 1545   LDLCALC 45 07/31/2022 1545   LABVLDL 25 07/31/2022 1545   External labs:   Labs 11/05/2021:  TSH normal at 2.07.  Labs 02/11/2021:  Serum glucose 91 mg, BUN 26, creatinine 1.45, EGFR 54 mL.  Total cholesterol 127, triglycerides 87, HDL 51, LDL 59.  Medications and allergies   Allergies  Allergen Reactions   Iodinated Contrast Media Anaphylaxis   Iohexol Anaphylaxis and Shortness Of Breath     Code: HIVES, Desc: hives and tachycardia last time pt received IV CM kdean 07/19/06, Onset Date: [id number]   On 9/9 pt had a ct head w/ and received 13hr premedication. Was fine after scan, but broke out in hives when he got home.  Took 148m benedryl at home PO, and was fine.    [place] Thyroid [Thyroid]     mouth swelling   Codeine     agitation   Dilantin [Phenytoin] Other (See Comments)    Destroyed Blood platelets    Heparin Hives  On going therapy    Hydrocodone     Agitation   Nuvigil [Armodafinil]     rash   Other     Follows Kosher Diet; Needs Kosher products   Shellfish  Allergy Other (See Comments)    Mouth tingles   Flagyl [Metronidazole] Rash   Rocephin [Ceftriaxone] Rash     Current Outpatient Medications:    acetaminophen (TYLENOL) 500 MG tablet, Take 1-2 tablets (500-1,000 mg total) by mouth every 6 (six) hours as needed for moderate pain., Disp: 30 tablet, Rfl: 0   albuterol (PROVENTIL HFA;VENTOLIN HFA) 108 (90 BASE) MCG/ACT inhaler, Inhale 2 puffs into the lungs every 6 (six) hours as needed for wheezing or shortness of breath., Disp: , Rfl:    apixaban (ELIQUIS) 5 MG TABS tablet, Take 1 tablet (5 mg total) by mouth 2 (two) times daily., Disp: 60 tablet, Rfl: 2   aspirin EC 81 MG tablet, Take 1 tablet (81 mg total) by mouth daily. Swallow whole., Disp: 30 tablet, Rfl: 12   azelastine (ASTELIN) 0.1 % nasal spray, Place 1 spray into both nostrils 2 (two) times daily as needed for allergies., Disp: , Rfl:    dapagliflozin propanediol (FARXIGA) 10 MG TABS tablet, Take 1 tablet (10 mg total) by mouth daily., Disp: 30 tablet, Rfl: 3   fluticasone-salmeterol (ADVAIR HFA) 230-21 MCG/ACT inhaler, Inhale 2 puffs into the lungs 2 (two) times daily., Disp: , Rfl:    irbesartan (AVAPRO) 150 MG tablet, Take 1 tablet (150 mg total) by mouth every evening., Disp: 90 tablet, Rfl: 3   levETIRAcetam (KEPPRA) 500 MG tablet, Take 500 mg by mouth 2 (two) times daily. , Disp: , Rfl:    levothyroxine (SYNTHROID) 50 MCG tablet, TAKE 1 TABLET BY MOUTH  DAILY (Patient taking differently: Take 50-100 mcg by mouth See admin instructions. 50 mcg daily in the morning, alternating with 100 mcg every other day), Disp: 90 tablet, Rfl: 3   pantoprazole (PROTONIX) 40 MG tablet, Take 40 mg by mouth every other day., Disp: , Rfl:    sertraline (ZOLOFT) 25 MG tablet, Take 1 tablet (25 mg total) by mouth daily., Disp: 90 tablet, Rfl: 0   tamsulosin (FLOMAX) 0.4 MG CAPS capsule, Take 1 capsule (0.4 mg total) by mouth daily., Disp: 90 capsule, Rfl: 0   diltiazem (CARDIZEM CD) 180 MG 24 hr  capsule, Take 1 capsule (180 mg total) by mouth daily., Disp: 30 capsule, Rfl: 2   metoprolol succinate (TOPROL-XL) 50 MG 24 hr tablet, Take 1 tablet (50 mg total) by mouth daily. Take with or immediately following a meal., Disp: 90 tablet, Rfl: 3   rosuvastatin (CRESTOR) 20 MG tablet, Take 1 tablet (20 mg total) by mouth daily., Disp: , Rfl:    sotalol (BETAPACE) 80 MG tablet, Take 1 tablet (80 mg total) by mouth 2 (two) times daily., Disp: 60 tablet, Rfl: 1    Radiology:   Ultrasound of the abdomen 09/18/2020: 1. Multiple hepatic cysts, largest measuring 7.0 cm in the right hepatic lobe. 2. Two small 1.2 cm and 0.9 cm round hyperechoic lesions in the right hepatic lobe, probably hemangiomas, benign. If definitive characterization is required, consider follow-up MRI abdomen without and with contrast in 6 months. 3. Low-density lesions throughout the liver upper measuring up to 6 cm, favor cysts, but these cannot be fully characterized on this noncontrast study.   CT chest without contrast 12/24/2021: 1. 4.2 cm ascending thoracic aortic aneurysm, stable from the cardiac CT performed on 08/21/2020. Recommend  annual imaging followup by CTA or MRA. This recommendation follows 2010 ACCF/AHA/AATS/ACR/ASA/SCA/SCAI/SIR/STS/SVM Guidelines for the Diagnosis and Management of Patients with Thoracic Aortic Disease. Circulation. 2010; [id number]. Aortic aneurysm NOS (ICD10-I71.9) 2. No acute findings. 3. Minor coronary artery calcifications and mild aortic atherosclerosis. 4. Aortic Atherosclerosis (ICD10-I70.0).  Cardiac Studies:   ASD repair 01/11/2006: 1.  Intracardiac echocardiogram. 2.  Closure of the PFO with 28 mm CardioSeal septal occluder.  Right Left Heart Catheterization 03/10/22:  RA 7/5, mean 5 mmHg RV 39/5, EDP 10 mmHg PA 36/17, mean 26 mmHg. PW 21/21, mean 18 mmHg. QP/QS 1.0.  PVR 1.70 Wood units.   LV 155/13, EDP 30 mmHg. Ao 143/69, mean 100 mmHg.  There was no pressure  gradient across the aortic valve.   RCA: Anterior origin.  Dominant and continues to the PDA gives origin to very small PL branches, proximal segment has a 70% stenosis which is focal.  Distal RCA has a tandem 30% stenosis.  Mild disease is present throughout the RCA. LM: Large caliber vessel.  No significant disease. LAD: Large vessel giving origin to a very large D1.  There is mild 20 to 30% stenosis in the proximal to mid segment of the LAD.  Mild disease in the ostium of the D1. LCx: Very large caliber vessel giving origin to a large OM1 which has secondary branches.  Circumflex continues in the AV groove, OM1 is large with mild disease.  Proximal segment has mild ectasia and a 10 to 20% stenosis.   Impression: Mild pulmonary hypertension secondary to elevated EDP, single-vessel coronary artery disease that is of significance involving the right coronary artery which is moderate-sized vessel.  Mild disease is evident in the LAD and CX.   Aortic valve replacement using a 25 mm bioprosthetic valve and CABG x1 with SVG to PDA on 04/22/2022    [facility] ECHOCARDIOGRAM COMPLETE 02/04/2022  Narrative Echocardiogram 02/04/2022: Mildly depressed LV systolic function with EF 46%. Left ventricle cavity is moderate to severely dilated. Moderate concentric hypertrophy of the left ventricle. Hypokinetic global wall motion. Calculated EF 46%. Left atrial cavity is moderately dilated at 4.2 cm. Structurally normal trileaflet aortic valve.  Moderate to severe aortic regurgitation. Structurally normal mitral valve.  Mild to moderate mitral regurgitation. Structurally normal tricuspid valve.  Moderate tricuspid regurgitation. Moderate to severe pulmonary hypertension. RVSP measures 60 mmHg. Dilated pulmonary artery. IVC is dilated with respiratory variation.     EKG:   EKG sinus bradycardia at the rate of 55 beats minute, normal axis, LVH with repolarization abnormality, cannot exclude high lateral ischemia.   Normal QT interval.  Compared to 07/31/2022, atrial fibrillation with RVR not present.  Compared to 06/15/2022, no change in the ST-T wave changes  EKG 07/31/2022: Atrial fibrillation with rapid ventricular response at rate of 160 bpm, normal axis, diffuse nonspecific ST-T abnormality.    Assessment     ICD-10-CM   1. Paroxysmal atrial fibrillation (HCC)  I48.0 EKG 12-Lead    2. Coronary artery disease involving native coronary artery of native heart without angina pectoris  I25.10     3. Non-ischemic cardiomyopathy (HCC)  I42.8 metoprolol succinate (TOPROL-XL) 50 MG 24 hr tablet    4. Mild hyperlipidemia  E78.5 rosuvastatin (CRESTOR) 20 MG tablet    5. Primary hypertension  I10 metoprolol succinate (TOPROL-XL) 50 MG 24 hr tablet    irbesartan (AVAPRO) 150 MG tablet      Medications Discontinued During This Encounter  Medication Reason   rosuvastatin (CRESTOR) 40 MG tablet  Reorder   metoprolol succinate (TOPROL-XL) 100 MG 24 hr tablet Reorder    Meds ordered this encounter  Medications   rosuvastatin (CRESTOR) 20 MG tablet    Sig: Take 1 tablet (20 mg total) by mouth daily.   irbesartan (AVAPRO) 150 MG tablet    Sig: Take 1 tablet (150 mg total) by mouth every evening.    Dispense:  90 tablet    Refill:  3    Do not fill until patient calls   Orders Placed This Encounter  Procedures   EKG 12-Lead   Recommendations:   [name] is a 67 y.o. Caucasian male with brain abscess diagnosed in 2012 for which he underwent craniotomy, has mild residual left-sided facial and arm weakness and seizures controlled with Kepra.  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  Also during hospital admission, patient developed severe platypnea-orthodeoxia syndrome and hence underwent successful repair of the atrial septal defect with implantation of a 28 mm CardioSEAL septal occluder on  01/11/2006.  He was also tested positive for DVT and small peripheral PE at that time.    His past medical history significant for hypertension, hyperlipidemia, OSA on CPAP, bronchial asthma, aortic root dilatation and severe aortic regurgitation and chronic stage IIIa kidney disease related to prior NSAID use.  Patient underwent aortic valve replacement using a 25 mm bioprosthetic valve and CABG x1 with SVG to PDA on 04/22/2022 by Dr. Coralie Common, post op A-fib. He had recurrence of atrial fibrillation on 06/15/2022, I started him on sotalol and he was supposed to have cardioversion however he went back into sinus rhythm.  He is presently on Eliquis.  Presents here for a 4-week office visit.  1. Paroxysmal atrial fibrillation ([place]) Patient is tolerating sotalol, QT interval is normal, is converted back to sinus rhythm.  In view of being on sotalol, I will reduce the dose of the metoprolol succinate from 100 mg to 50 mg daily, he will continue with diltiazem as well.  I will consider completely stopping his metoprolol succinate if his LVEF improved.  He has been scheduled for an echocardiogram.  I would like to wait for 3 months prior to changing this.  2. Coronary artery disease involving native coronary artery of native heart without angina pectoris He has very mild disease in his right coronary artery that was bypassed, no angina pectoris.  3. Non-ischemic cardiomyopathy ([place]) Nonischemic cardiomyopathy related to severe aortic regurgitation.  I expect his LVEF to improve.  His blood pressure is elevated hence I also added irbesartan, previously other ARB's caused him to have cough.  But he has been tolerating irbesartan in the past without any complications, hence we will restart this medication.  4. Mild hyperlipidemia He has very mild hyperlipidemia, after CABG, he was placed on 40 mg of Crestor, previously on 10 mg.  I will cut this down to 20 mg, I will give him a statin holiday for 2  weeks.  5. Primary hypertension As dictated above, blood pressure is slightly elevated today, will add ARB, irbesartan 150 mg in the evening.  Office visit in 3 months.    Adrian Prows, MD, [facility] 08/22/2022, 8:36 AM Office: [phone]

## 2022-08-24 ENCOUNTER — Encounter: Payer: Self-pay | Admitting: Cardiology

## 2022-08-24 NOTE — Telephone Encounter (Signed)
From patient

## 2022-08-25 NOTE — Telephone Encounter (Signed)
From patient

## 2022-09-09 ENCOUNTER — Ambulatory Visit: Payer: No Typology Code available for payment source

## 2022-09-09 DIAGNOSIS — Z952 Presence of prosthetic heart valve: Secondary | ICD-10-CM

## 2022-09-09 NOTE — Progress Notes (Deleted)
HPI M never smoker followed for OSA, complicated by hx brain abscess/craniotomy/seizure, seasonal allergic rhinitis, HBP, CKD3, VTE, GERD NPSG 03/22/14 AHI 30.6/ hr, CPAP 9, weight 190 lbs  =============================================   06/19/20- 67 year old male never smoker followed for OSA, complicated by history brain abscess/craniotomy/seizure, seasonal allergic rhinitis, asthmatic bronchitis, HBP, CK D, CDE/PE, GERD, Hypothyroid,  CPAP 23/ [employer] Download-compliance 80%, AHI 1.2/ hr  Machine is 67 year old Body weight today-189 lbs Covid vax- 2Phizer Flu vax-had Discussed long hx rhinitis with drip since childhood. Broadly skin- test positive for environmental allergens, but vaccine did not help. Flare of asthmatic bronchitis treated with Zpak, 2 rounds prednisone. Still frequent cough- dry. Daily Astelin.Estanislado Spire no help. Doess very well with codeine cough syrup- discussed. Denies sinus infection. GERD is controlled. Taking daily Allegra. Plays trumpet, lifts weights. CXR - 02/14/20- IMPRESSION: No active cardiopulmonary disease.  09/10/22- 68 year old male never smoker followed for OSA, complicated by history Brain Abscess/craniotomy/seizure, seasonal allergic Rhinitis, Asthmatic Bronchitis, HBP, CKD,  PAFib/ Eliquis, , CAD/CABG/ AV replaced, CDE/PE, GERD, Hypothyroid,  - albuterol hfa,Advair 230 hfa,  CPAP 23/ [employer] Download-compliance  Body weight today- Covid vax- 2Phizer Flu vax-  CXR 07/20/22- FINDINGS: Sternal wires. Prosthetic aortic valve. Possible atrial occlusion device. No consolidation, pneumothorax or effusion. No edema. Normal cardiopericardial silhouette with tortuous aorta. Apical pleural thickening. Degenerative changes of the spine. IMPRESSION: Postop chest.  No acute cardiopulmonary disease.  ROS-see HPI                 + = pos Constitutional:   No-   weight loss, night sweats, fevers, chills, fatigue, lassitude. HEENT:   No-  headaches, difficulty  swallowing, tooth/dental problems, sore throat,       No-  sneezing, itching, ear ache, + nasal congestion, post nasal drip,  CV:  No-   chest pain, orthopnea, PND, swelling in lower extremities, anasarca,                                               dizziness, palpitations Resp: No-   shortness of breath with exertion or at rest.              No-   productive cough,  No non-productive cough,  No- coughing up of blood.              No-   change in color of mucus.  No- wheezing.   Skin: No-   rash or lesions. GI:  No-   heartburn, indigestion, abdominal pain, nausea, vomiting,  GU:  MS:  + joint pain or swelling.  Neuro-    + Chronic neuropathic weakness left hand Psych:  No- change in mood or affect. No depression or anxiety.  No memory loss.  OBJ- Physical Exam General- Alert, Oriented, Affect-appropriate, Distress- none acute, + beard Skin- rash-none, lesions- none, excoriation- none Lymphadenopathy- none Head- atraumatic            Eyes- Gross vision intact, PERRLA, conjunctivae and secretions clear            Ears- Hearing, canals-normal            Nose-   + turbinate edema, no-Septal dev, mucus, polyps, erosion, perforation             Throat- Mallampati III , mucosa clear , drainage- none, tonsils- atrophic Neck- flexible , trachea  midline, no stridor , thyroid nl, carotid no bruit Chest - symmetrical excursion , unlabored           Heart/CV- RRR , no murmur , no gallop  , no rub, nl s1 s2                           - JVD- none , edema- none, stasis changes- none, varices- none           Lung- clear to P&A, wheeze- none, cough- none , dullness-none, rub- none           Chest wall-  Abd-  Br/ Gen/ Rectal- Not done, not indicated Extrem- cyanosis- none, clubbing, none, atrophy- none, strength- nl, + brace left wrist Neuro- articulate, weakness left hand by history, not examined

## 2022-09-10 ENCOUNTER — Ambulatory Visit: Payer: No Typology Code available for payment source | Admitting: Internal Medicine

## 2022-09-14 ENCOUNTER — Other Ambulatory Visit: Payer: Self-pay

## 2022-09-14 ENCOUNTER — Encounter: Payer: Self-pay | Admitting: Cardiology

## 2022-09-14 DIAGNOSIS — I480 Paroxysmal atrial fibrillation: Secondary | ICD-10-CM

## 2022-09-14 MED ORDER — APIXABAN 5 MG PO TABS: 5.0000 mg | ORAL_TABLET | Freq: Two times a day (BID) | ORAL | 2 refills | Status: DC

## 2022-09-17 ENCOUNTER — Other Ambulatory Visit: Payer: Self-pay | Admitting: Cardiology

## 2022-09-17 ENCOUNTER — Ambulatory Visit: Payer: No Typology Code available for payment source | Admitting: Cardiology

## 2022-09-28 ENCOUNTER — Encounter: Payer: Self-pay | Admitting: Cardiology

## 2022-09-28 NOTE — Telephone Encounter (Signed)
From patient.

## 2022-09-30 ENCOUNTER — Other Ambulatory Visit: Payer: Self-pay

## 2022-09-30 ENCOUNTER — Encounter: Payer: Self-pay | Admitting: Cardiology

## 2022-09-30 DIAGNOSIS — I480 Paroxysmal atrial fibrillation: Secondary | ICD-10-CM

## 2022-09-30 MED ORDER — DILTIAZEM HCL ER COATED BEADS 180 MG PO CP24: 180.0000 mg | ORAL_CAPSULE | Freq: Every day | ORAL | 3 refills | Status: DC

## 2022-10-01 ENCOUNTER — Encounter: Payer: Self-pay | Admitting: Cardiology

## 2022-10-01 NOTE — Telephone Encounter (Signed)
From patient.

## 2022-10-14 ENCOUNTER — Encounter: Payer: Self-pay | Admitting: Thoracic Surgery (Cardiothoracic Vascular Surgery)

## 2022-10-14 ENCOUNTER — Encounter: Payer: Self-pay | Admitting: Cardiology

## 2022-10-14 DIAGNOSIS — Z952 Presence of prosthetic heart valve: Secondary | ICD-10-CM

## 2022-10-14 DIAGNOSIS — Z2989 Encounter for other specified prophylactic measures: Secondary | ICD-10-CM

## 2022-10-14 MED ORDER — AMOXICILLIN 500 MG PO CAPS: 2000.0000 mg | ORAL_CAPSULE | Freq: Once | ORAL | 3 refills | Status: AC | PRN

## 2022-10-14 NOTE — Telephone Encounter (Signed)
From patient.

## 2022-10-14 NOTE — Telephone Encounter (Signed)
ICD-10-CM   1. S/P AVR  Z95.2 amoxicillin (AMOXIL) 500 MG capsule    2. SBE (subacute bacterial endocarditis) prophylaxis candidate  Z29.89 amoxicillin (AMOXIL) 500 MG capsule     Meds ordered this encounter  Medications   amoxicillin (AMOXIL) 500 MG capsule    Sig: Take 4 capsules (2,000 mg total) by mouth once as needed for up to 1 dose (1 hour before dental procedure).    Dispense:  4 capsule    Refill:  3

## 2022-10-19 ENCOUNTER — Ambulatory Visit: Payer: No Typology Code available for payment source | Admitting: Internal Medicine

## 2022-10-22 ENCOUNTER — Encounter: Payer: Self-pay | Admitting: Cardiology

## 2022-10-23 NOTE — Telephone Encounter (Signed)
From patient.

## 2022-10-23 NOTE — Telephone Encounter (Signed)
From pt

## 2022-10-27 ENCOUNTER — Other Ambulatory Visit: Payer: Self-pay

## 2022-10-27 ENCOUNTER — Encounter: Payer: Self-pay | Admitting: Cardiology

## 2022-10-27 DIAGNOSIS — I10 Essential (primary) hypertension: Secondary | ICD-10-CM

## 2022-10-27 DIAGNOSIS — I428 Other cardiomyopathies: Secondary | ICD-10-CM

## 2022-10-27 DIAGNOSIS — I480 Paroxysmal atrial fibrillation: Secondary | ICD-10-CM

## 2022-10-27 MED ORDER — DILTIAZEM HCL ER COATED BEADS 180 MG PO CP24: 180.0000 mg | ORAL_CAPSULE | Freq: Every day | ORAL | 0 refills | Status: DC

## 2022-10-27 MED ORDER — METOPROLOL SUCCINATE ER 50 MG PO TB24: 50.0000 mg | ORAL_TABLET | Freq: Every day | ORAL | 3 refills | Status: DC

## 2022-11-02 ENCOUNTER — Other Ambulatory Visit: Payer: Self-pay | Admitting: Cardiology

## 2022-11-18 ENCOUNTER — Ambulatory Visit: Payer: No Typology Code available for payment source | Admitting: Cardiology

## 2022-11-19 ENCOUNTER — Ambulatory Visit: Payer: No Typology Code available for payment source | Admitting: Cardiology

## 2022-11-19 ENCOUNTER — Encounter: Payer: Self-pay | Admitting: Cardiology

## 2022-11-19 VITALS — BP 107/68 | HR 60 | Resp 16 | Ht 67.0 in | Wt 185.6 lb

## 2022-11-19 DIAGNOSIS — E7800 Pure hypercholesterolemia, unspecified: Secondary | ICD-10-CM

## 2022-11-19 DIAGNOSIS — I480 Paroxysmal atrial fibrillation: Secondary | ICD-10-CM

## 2022-11-19 DIAGNOSIS — I2510 Atherosclerotic heart disease of native coronary artery without angina pectoris: Secondary | ICD-10-CM

## 2022-11-19 DIAGNOSIS — Z952 Presence of prosthetic heart valve: Secondary | ICD-10-CM

## 2022-11-19 NOTE — Progress Notes (Signed)
Primary Physician/Referring:  Gweneth Dimitri, MD  Patient ID: [name], male    DOB: 07-10-1956, 67 y.o.   MRN: [id number]  Chief Complaint  Patient presents with   PAF   Cardiomyopathy   Hypertension   Follow-up    3 months   HPI:    [name]  is a 68 y.o. Caucasian male with brain abscess diagnosed in 2012 for which he underwent craniotomy, has mild residual left-sided facial and arm weakness and seizures controlled with Kepra.  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  Also during hospital admission, patient developed severe platypnea-orthodeoxia syndrome and hence underwent successful repair of the atrial septal defect with implantation of a 28 mm CardioSEAL septal occluder on 01/11/2006.  He was also tested positive for DVT and small peripheral PE at that time.    His past medical history significant for hypertension, hyperlipidemia, OSA on CPAP, bronchial asthma, aortic root dilatation and severe aortic regurgitation and chronic stage IIIa kidney disease related to prior NSAID use.  Patient underwent aortic valve replacement using a 25 mm bioprosthetic valve and CABG x1 with SVG to PDA on 04/22/2022 by Dr. Eugenio Hoes, post op A-fib. He had recurrence of atrial fibrillation on 06/15/2022.  Was started on sotalol and had converted to sinus rhythm.  This is a 75-month office visit.  Presently asymptomatic.  He has discontinued taking sotalol for some reason and is not aware of it.  Past Medical History:  Diagnosis Date   ADHD    Anginal pain (HCC)    Anxiety    Arthritis    Asthma    excerise induced   Brain abscess    Chronic renal insufficiency, stage III (moderate) (HCC)    Coronary artery disease    DVT (deep venous thrombosis) (HCC)    Esophageal reflux    History of osteomyelitis    History of transfusion of platelets    Hx of pulmonary embolus    x  2   Hyperlipidemia    Hypertension    Hypothyroidism    Neuropathy    Patent foramen ovale    S/P  Aortic valve replacement using a 25mm Inspiris Pericardial valve 04/22/2022 04/22/2022   Seizures (HCC)    Sleep apnea    Sleep study done 5-6  years ago machine set at 10   Social History   Tobacco Use   Smoking status: Never   Smokeless tobacco: Never   Tobacco comments:    Never smoke 05/11/22  Substance Use Topics   Alcohol use: No    Comment: quit 20 years ago   Marital Status: Married  ROS  Review of Systems  Cardiovascular:  Negative for chest pain, dyspnea on exertion, irregular heartbeat and leg swelling.   Objective  Blood pressure 107/68, pulse 60, resp. rate 16, height 5\' 7"  (1.702 m), weight 185 lb 9.6 oz (84.2 kg), SpO2 98 %.     11/19/2022    9:51 AM 08/20/2022    2:35 PM 07/31/2022    2:31 PM  Vitals with BMI  Height 5\' 7"  5\' 7"  5\' 7"   Weight 185 lbs 10 oz 179 lbs 13 oz 178 lbs  BMI 29.06 28.15 27.87  Systolic 107 140 409  Diastolic 68 90 73  Pulse 60 60 86     Physical Exam Neck:     Vascular: No carotid  bruit or JVD.  Cardiovascular:     Rate and Rhythm: Normal rate and regular rhythm.     Pulses: Intact distal pulses.     Heart sounds: Murmur heard.     Early systolic murmur is present with a grade of 2/6 at the upper right sternal border.     No gallop.  Pulmonary:     Effort: Pulmonary effort is normal.     Breath sounds: Normal breath sounds.  Abdominal:     General: Bowel sounds are normal.     Palpations: Abdomen is soft.  Musculoskeletal:     Right lower leg: No edema.     Left lower leg: No edema.    Laboratory examination:   Lab Results  Component Value Date   NA 142 07/31/2022   K 3.5 07/31/2022   CO2 22 07/31/2022   GLUCOSE 109 (H) 07/31/2022   BUN 18 07/31/2022   CREATININE 1.06 07/31/2022   CALCIUM 9.5 07/31/2022   EGFR 77 07/31/2022   GFRNONAA >60 05/11/2022       Latest Ref Rng & Units 07/31/2022    3:45 PM  05/11/2022    2:34 PM 05/04/2022    1:14 AM  CMP  Glucose 70 - 99 mg/dL 147  829  562   BUN 8 - 27 mg/dL 18  20  20    Creatinine 0.76 - 1.27 mg/dL 1.30  8.65  7.84   Sodium 134 - 144 mmol/L 142  139  136   Potassium 3.5 - 5.2 mmol/L 3.5  3.9  3.9   Chloride 96 - 106 mmol/L 104  106  102   CO2 20 - 29 mmol/L 22  24  26    Calcium 8.6 - 10.2 mg/dL 9.5  9.4  8.6       Latest Ref Rng & Units 07/31/2022    3:45 PM 04/29/2022    5:13 AM 04/28/2022    4:39 AM  CBC  WBC 3.4 - 10.8 x10E3/uL 12.6  11.7  11.2   Hemoglobin 13.0 - 17.7 g/dL 69.6  8.0  8.1   Hematocrit 37.5 - 51.0 % 39.3  24.6  23.3   Platelets 150 - 450 x10E3/uL 497  338  294    Lipid Panel     Component Value Date/Time   CHOL 113 07/31/2022 1545   TRIG 142 07/31/2022 1545   HDL 43 07/31/2022 1545   LDLCALC 45 07/31/2022 1545   LABVLDL 25 07/31/2022 1545   External labs:   Cholesterol, total 176.000 m 11/11/2022 HDL 80.000 mg 11/11/2022 LDL 75.000 mg 11/11/2022 Triglycerides 120.000 m 11/11/2022  A1C 5.200 % 04/20/2022 TSH 3.020 11/11/2022  Hemoglobin 8.000 g/dL 29/52/8413 Platelets 244.010 x1 07/31/2022  Creatinine, Serum 1.250 mg/ 11/11/2022 Potassium 4.500 mm 11/11/2022 ALT (SGPT) 16.000 U/L 11/11/2022  Labs 11/05/2021:  TSH normal at 2.07.  Labs 02/11/2021:  Serum glucose 91 mg, BUN 26, creatinine 1.45, EGFR 54 mL.  Total cholesterol 127, triglycerides 87, HDL 51, LDL 59.  Radiology:   Ultrasound of the abdomen 09/18/2020: 1. Multiple hepatic cysts, largest measuring 7.0 cm in the right hepatic lobe. 2. Two small 1.2 cm and 0.9 cm round hyperechoic lesions in the right hepatic lobe, probably hemangiomas, benign. If definitive characterization is required, consider follow-up MRI abdomen without and with contrast in 6 months. 3. Low-density lesions throughout the liver upper measuring up to 6 cm, favor cysts, but these cannot be fully characterized on this noncontrast study.   CT chest without contrast 12/24/2021:  1.  4.2 cm ascending thoracic aortic aneurysm, stable from the cardiac CT performed on 08/21/2020. Recommend annual imaging followup by CTA or MRA. This recommendation follows 2010 ACCF/AHA/AATS/ACR/ASA/SCA/SCAI/SIR/STS/SVM Guidelines for the Diagnosis and Management of Patients with Thoracic Aortic Disease. Circulation. 2010; 121: Z610-R604. Aortic aneurysm NOS (ICD10-I71.9) 2. No acute findings. 3. Minor coronary artery calcifications and mild aortic atherosclerosis. 4. Aortic Atherosclerosis (ICD10-I70.0).  Cardiac Studies:   ASD repair 01/11/2006: 1.  Intracardiac echocardiogram. 2.  Closure of the PFO with 28 mm CardioSeal septal occluder.  Right Left Heart Catheterization 03/10/22:  RA 7/5, mean 5 mmHg RV 39/5, EDP 10 mmHg PA 36/17, mean 26 mmHg. PW 21/21, mean 18 mmHg. QP/QS 1.0.  PVR 1.70 Wood units.   LV 155/13, EDP 30 mmHg. Ao 143/69, mean 100 mmHg.  There was no pressure gradient across the aortic valve.   RCA: Anterior origin.  Dominant and continues to the PDA gives origin to very small PL branches, proximal segment has a 70% stenosis which is focal.  Distal RCA has a tandem 30% stenosis.  Mild disease is present throughout the RCA. LM: Large caliber vessel.  No significant disease. LAD: Large vessel giving origin to a very large D1.  There is mild 20 to 30% stenosis in the proximal to mid segment of the LAD.  Mild disease in the ostium of the D1. LCx: Very large caliber vessel giving origin to a large OM1 which has secondary branches.  Circumflex continues in the AV groove, OM1 is large with mild disease.  Proximal segment has mild ectasia and a 10 to 20% stenosis.   Impression: Mild pulmonary hypertension secondary to elevated EDP, single-vessel coronary artery disease that is of significance involving the right coronary artery which is moderate-sized vessel.  Mild disease is evident in the LAD and CX.   Aortic valve replacement using a 25 mm [employer] Resilia  Bioprosthetic Valve  and CABG x1 with SVG to PDA on 04/22/2022    PCV ECHOCARDIOGRAM COMPLETE 09/09/2022  Narrative Echocardiogram 09/09/2022: Left ventricle cavity is normal in size. Mild concentric hypertrophy of the left ventricle. Abnormal septal wall motion due to post-operative valve with mild global hypokinesis. LVEF 45-50%. Normal diastolic filling pattern. S/p AVR (25 mm bioprosthetic valve). Well seated, no pannus or regurgitation. Mean PG 8 mmHg, which is likely normal. Moderate (Grade II) mitral regurgitation. Normal right atrial pressure. The aortic root is mildly dilated at 4.1 cm. This is consistent with CT chest 12/2021. Compared to previous study on 02/04/2022, LV dilatation and AI have resolved post AVR.     EKG:   EKG 11/11/2022: Normal sinus rhythm/respiratory at rate of 56 bpm, normal axis nonspecific high lateral T wave abnormality.  Normal QT interval.  Compared to 08/20/2022, no change.  EKG 07/31/2022: Atrial fibrillation with rapid ventricular response at rate of 160 bpm, normal axis, diffuse nonspecific ST-T abnormality.    Medications and allergies   Allergies  Allergen Reactions   Iodinated Contrast Media Anaphylaxis   Iohexol Anaphylaxis and Shortness Of Breath     Code: HIVES, Desc: hives and tachycardia last time pt received IV CM kdean 07/19/06, Onset Date: [id number]   On 9/9 pt had a ct head w/ and received 13hr premedication. Was fine after scan, but broke out in hives when he got home.  Took 100mg  benedryl at home PO, and was fine.    Armour Thyroid [Thyroid]     mouth swelling   Codeine     agitation   Dilantin [  Phenytoin] Other (See Comments)    Destroyed Blood platelets    Heparin Hives    On going therapy    Hydrocodone     Agitation   Nuvigil [Armodafinil]     rash   Other     Follows Kosher Diet; Needs Kosher products   Shellfish Allergy Other (See Comments)    Mouth tingles   Flagyl [Metronidazole] Rash   Rocephin [Ceftriaxone] Rash     Current Outpatient Medications:    albuterol (PROVENTIL HFA;VENTOLIN HFA) 108 (90 BASE) MCG/ACT inhaler, Inhale 2 puffs into the lungs every 6 (six) hours as needed for wheezing or shortness of breath., Disp: , Rfl:    amoxicillin (AMOXIL) 500 MG capsule, Take 4 capsules (2,000 mg total) by mouth once as needed for up to 1 dose (1 hour before dental procedure)., Disp: 4 capsule, Rfl: 3   apixaban (ELIQUIS) 5 MG TABS tablet, Take 1 tablet (5 mg total) by mouth 2 (two) times daily., Disp: 180 tablet, Rfl: 2   azelastine (ASTELIN) 0.1 % nasal spray, Place 1 spray into both nostrils 2 (two) times daily as needed for allergies., Disp: , Rfl:    diltiazem (CARDIZEM CD) 180 MG 24 hr capsule, Take 1 capsule (180 mg total) by mouth daily., Disp: 90 capsule, Rfl: 0   FARXIGA 10 MG TABS tablet, TAKE 1 TABLET BY MOUTH DAILY, Disp: 90 tablet, Rfl: 3   fluticasone-salmeterol (ADVAIR HFA) 230-21 MCG/ACT inhaler, Inhale 2 puffs into the lungs 2 (two) times daily., Disp: , Rfl:    irbesartan (AVAPRO) 150 MG tablet, Take 1 tablet (150 mg total) by mouth every evening., Disp: 90 tablet, Rfl: 3   levETIRAcetam (KEPPRA) 500 MG tablet, Take 500 mg by mouth 2 (two) times daily. , Disp: , Rfl:    levothyroxine (SYNTHROID) 50 MCG tablet, TAKE 1 TABLET BY MOUTH  DAILY (Patient taking differently: Take 50-100 mcg by mouth See admin instructions. 50 mcg daily in the morning, alternating with 100 mcg every other day), Disp: 90 tablet, Rfl: 3   metoprolol succinate (TOPROL-XL) 50 MG 24 hr tablet, Take 1 tablet (50 mg total) by mouth daily. Take with or immediately following a meal., Disp: 90 tablet, Rfl: 3   pantoprazole (PROTONIX) 40 MG tablet, Take 40 mg by mouth every other day., Disp: , Rfl:    rosuvastatin (CRESTOR) 20 MG tablet, Take 1 tablet (20 mg total) by mouth daily., Disp: , Rfl:    sertraline (ZOLOFT) 25 MG tablet, TAKE 1 TABLET BY MOUTH DAILY, Disp: 90 tablet, Rfl: 3  Assessment     ICD-10-CM   1. Paroxysmal  atrial fibrillation (HCC)  I48.0 EKG 12-Lead    2. Coronary artery disease involving native coronary artery of native heart without angina pectoris  I25.10     3. S/P  25 mm [employer] Resilia Bioprosthetic Valve and SVG to RCA 04/22/2022  Z95.2     4. Hypercholesteremia  E78.00       Medications Discontinued During This Encounter  Medication Reason   tamsulosin (FLOMAX) 0.4 MG CAPS capsule    sotalol (BETAPACE) 80 MG tablet    acetaminophen (TYLENOL) 500 MG tablet No longer needed (for PRN medications)   aspirin EC 81 MG tablet Completed Course    No orders of the defined types were placed in this encounter.  Orders Placed This Encounter  Procedures   EKG 12-Lead   Recommendations:   [name] is a 67 y.o. Caucasian male with brain abscess diagnosed  in 2012 for which he underwent craniotomy, has mild residual left-sided facial and arm weakness and seizures controlled with Sheralyn Boatman, large PFO with paradoxical brain abscess that occurred after dental work-up, severe platypnea-orthodeoxia syndrome and hence underwent successful repair of the atrial septal defect with implantation of a 28 mm CardioSEAL septal occluder on 01/11/2006.   His past medical history significant for hypertension, hyperlipidemia, OSA on CPAP, bronchial asthma, aortic root dilatation and severe aortic regurgitation and chronic stage IIIa kidney disease related to prior NSAID use.  Patient underwent aortic valve replacement using a 25 mm bioprosthetic valve and CABG x1 with SVG to PDA on 04/22/2022 by Dr. Eugenio Hoes, post op A-fib. He had recurrence of atrial fibrillation on 06/15/2022, I started him on sotalol and he was supposed to have cardioversion however he went back into sinus rhythm.    1. Paroxysmal atrial fibrillation (HCC) For some reason patient has discontinued sotalol and is presently only taking diltiazem and metoprolol.  Fortunately he is maintaining sinus rhythm.  I still believe that  his atrial fibrillation is postop related as it was within <3 months after the surgery.  If he continues to maintain sinus rhythm, could consider discontinuing Eliquis which he is tolerating and switching him back to aspirin only.  For now I will discontinue aspirin to reduce the risk of bleeding.  EKG reveals normal sinus rhythm.  - EKG 12-Lead  2. Coronary artery disease involving native coronary artery of native heart without angina pectoris He has very mild to moderate coronary artery disease with one-vessel bypass surgery to the right coronary artery which only had a 60 to 70% stenosis.  Lipids reviewed.  He has not had any angina.  3. S/P  25 mm [employer] Resilia Bioprosthetic Valve and SVG to RCA 04/22/2022 No change in his physical exam, he needs endocarditis prophylaxis  4. Hypercholesteremia  Labs reviewed, lipids are well-controlled.  His LDL is slightly increased however he has only moderate coronary disease, continue present medical management.  Office visit in 6 months or sooner if problems.      Yates Decamp, MD, [facility] 11/19/2022, 11:20 AM Office: [phone]

## 2022-11-24 ENCOUNTER — Ambulatory Visit: Payer: No Typology Code available for payment source | Admitting: Internal Medicine

## 2022-12-04 ENCOUNTER — Encounter: Payer: Self-pay | Admitting: Cardiology

## 2022-12-04 ENCOUNTER — Other Ambulatory Visit: Payer: Self-pay

## 2022-12-04 DIAGNOSIS — I480 Paroxysmal atrial fibrillation: Secondary | ICD-10-CM

## 2022-12-04 MED ORDER — DILTIAZEM HCL ER COATED BEADS 180 MG PO CP24: 180.0000 mg | ORAL_CAPSULE | Freq: Every day | ORAL | 3 refills | Status: DC

## 2022-12-12 NOTE — Progress Notes (Deleted)
HPI M never smoker followed for OSA, complicated by hx brain abscess/craniotomy/seizure, seasonal allergic rhinitis, HBP, CKD3, VTE, GERD NPSG 03/22/14 AHI 30.6/ hr, CPAP 9, weight 190 lbs  =============================================  06/19/20- 67 year old male never smoker followed for OSA, complicated by history brain abscess/craniotomy/seizure, seasonal allergic rhinitis, asthmatic bronchitis, HBP, CK D, CDE/PE, GERD, Hypothyroid,  CPAP 23/ [employer] Download-compliance 80%, AHI 1.2/ hr  Machine is 67 year old Body weight today-189 lbs Covid vax- 2Phizer Flu vax-had Discussed long hx rhinitis with drip since childhood. Broadly skint test positive for environmental allergens, but vaccine did not help. Flare of asthmatic bronchitis treated with Zpak, 2 rounds prednisone. Still frequent cough- dry. Daily Astelin.Garfield Cornea no help. Doess very well with codeine cough syrup- discussed. Denies sinus infection. GERD is controlled. Taking daily Allegra. Plays trumpet, lifts weights. CXR - 02/14/20- IMPRESSION: No active cardiopulmonary disease.  12/15/22-  67 year old male never smoker followed for OSA, complicated by history brain abscess/craniotomy/seizure, seasonal allergic rhinitis, asthmatic bronchitis, HBP, CK D, CDE/PE, GERD, Hypothyroid, PAFib, Aortic Valve Replacement,  CPAP 23/ [employer] Download-compliance  Body weight today-  CXR 07/20/22-  IMPRESSION: Postop chest.  No acute cardiopulmonary disease.   ROS-see HPI                 + = pos Constitutional:   No-   weight loss, night sweats, fevers, chills, fatigue, lassitude. HEENT:   No-  headaches, difficulty swallowing, tooth/dental problems, sore throat,       No-  sneezing, itching, ear ache, + nasal congestion, post nasal drip,  CV:  No-   chest pain, orthopnea, PND, swelling in lower extremities, anasarca,                                               dizziness, palpitations Resp: No-   shortness of breath with exertion or at rest.               No-   productive cough,  No non-productive cough,  No- coughing up of blood.              No-   change in color of mucus.  No- wheezing.   Skin: No-   rash or lesions. GI:  No-   heartburn, indigestion, abdominal pain, nausea, vomiting,  GU:  MS:  + joint pain or swelling.  Neuro-    + Chronic neuropathic weakness left hand Psych:  No- change in mood or affect. No depression or anxiety.  No memory loss.  OBJ- Physical Exam General- Alert, Oriented, Affect-appropriate, Distress- none acute, + beard Skin- rash-none, lesions- none, excoriation- none Lymphadenopathy- none Head- atraumatic            Eyes- Gross vision intact, PERRLA, conjunctivae and secretions clear            Ears- Hearing, canals-normal            Nose-   + turbinate edema, no-Septal dev, mucus, polyps, erosion, perforation             Throat- Mallampati III , mucosa clear , drainage- none, tonsils- atrophic Neck- flexible , trachea midline, no stridor , thyroid nl, carotid no bruit Chest - symmetrical excursion , unlabored           Heart/CV- RRR , no murmur , no gallop  , no rub, nl s1 s2                           -  JVD- none , edema- none, stasis changes- none, varices- none           Lung- clear to P&A, wheeze- none, cough- none , dullness-none, rub- none           Chest wall-  Abd-  Br/ Gen/ Rectal- Not done, not indicated Extrem- cyanosis- none, clubbing, none, atrophy- none, strength- nl, + brace left wrist Neuro- articulate, weakness left hand by history, not examined

## 2022-12-15 ENCOUNTER — Ambulatory Visit: Payer: No Typology Code available for payment source | Admitting: Internal Medicine

## 2022-12-30 ENCOUNTER — Encounter: Payer: Self-pay | Admitting: Cardiology

## 2022-12-30 NOTE — Telephone Encounter (Signed)
From pt

## 2023-01-26 ENCOUNTER — Encounter: Payer: Self-pay | Admitting: Cardiology

## 2023-01-27 NOTE — Telephone Encounter (Signed)
 From pt

## 2023-03-21 ENCOUNTER — Encounter: Payer: Self-pay | Admitting: Cardiology

## 2023-03-22 NOTE — Telephone Encounter (Signed)
From patient.

## 2023-03-28 ENCOUNTER — Other Ambulatory Visit: Payer: Self-pay | Admitting: Cardiology

## 2023-03-28 DIAGNOSIS — I480 Paroxysmal atrial fibrillation: Secondary | ICD-10-CM

## 2023-03-29 ENCOUNTER — Encounter: Payer: Self-pay | Admitting: Cardiology

## 2023-03-30 NOTE — Telephone Encounter (Signed)
From patient.

## 2023-04-08 ENCOUNTER — Telehealth: Payer: Self-pay | Admitting: Cardiology

## 2023-04-08 NOTE — Telephone Encounter (Signed)
Ask him if he would like to be een by a NP or PA? I would like him to be seen if possible, otherwise he is stable

## 2023-04-08 NOTE — Telephone Encounter (Signed)
Pt aware I will forward to Dr. Jacinto Halim.  He was scheduled to see him 11/7, but this got cancelled when MD's office closed.  He called today to get it rescheduled and they told him they couldn't get him scheduled until January and he is not sure MD would want him waiting that long.  Pt says he is fine w/ that if MD is, but wants to make sure it is safe to wait till then being that he had a CABG last year. Aware will forward to Dr. Jacinto Halim for advisement and we will let him know.  Informed that it may be next week before he hears back from our office on this. Pt agreeable to plan.

## 2023-04-08 NOTE — Telephone Encounter (Signed)
Patient is requesting a call back from the nurse in regards to heart cath follow up.

## 2023-04-09 NOTE — Telephone Encounter (Signed)
LVM to r/s with NP/APP's or with Dr. Jacinto Halim. Will continue to call if needed to have this appointment r/s.

## 2023-05-20 ENCOUNTER — Ambulatory Visit: Payer: Self-pay | Admitting: Cardiology

## 2023-06-08 ENCOUNTER — Encounter: Payer: Self-pay | Admitting: Thoracic Surgery (Cardiothoracic Vascular Surgery)

## 2023-06-27 NOTE — Progress Notes (Unsigned)
Cardiology Office Note    Patient Name: [name] Date of Encounter: 06/27/2023  Primary Care Provider:  Gweneth Dimitri, MD Primary Cardiologist:  None Primary Electrophysiologist: None   Past Medical History    Past Medical History:  Diagnosis Date   ADHD    Anginal pain (HCC)    Anxiety    Arthritis    Asthma    excerise induced   Brain abscess    Chronic renal insufficiency, stage III (moderate) (HCC)    Coronary artery disease    DVT (deep venous thrombosis) (HCC)    Esophageal reflux    History of osteomyelitis    History of transfusion of platelets    Hx of pulmonary embolus    x 2   Hyperlipidemia    Hypertension    Hypothyroidism    Neuropathy    Patent foramen ovale    S/P  Aortic valve replacement using a 25mm Inspiris Pericardial valve 04/22/2022 04/22/2022   Seizures (HCC)    Sleep apnea    Sleep study done 5-6  years ago machine set at 10    History of Present Illness  [name] is a 67 y.o. male with a PMH of CAD s/p CABG x 1 and AVR due to severe AS on 04/2022, ASD s/p repair 2007, OSA (on CPAP), CKD stage IIIa, history of brain abscess 2012, HTN, HLD, hypothyroidism, DVT (on Eliquis), paroxysmal AF who presents today for follow-up.  [name] has been followed by Dr. Jacinto Halim since 2022 when he was seen for complaint of chronic dyspnea.  He has a complicated history with ASD repair in 2007 as well as brain abscess in 2012.  He completed a stress test that was negative for ischemia and low risk.  He had a 2D echo completed in 2023 that showed global hypokinesis with decreased LV function and moderately dilated LA with moderate to severe AR, TVR with mild PHT.  He was sent for [facility] and referred to thoracic surgery with CABG x 1 as well as AVR completed 04/2022.  He developed significant persistent AF postprocedure.  He was unable to be treated with amiodarone due to history of iodine allergy.  He underwent a TEE on 04/2022 followed by DCCV that  was successful.  He was loaded on Tikosyn and was seen in follow-up 06/15/22 by Dr. Jacinto Halim and reported being in atrial fibrillation.  He was scheduled for DCCV however converted back to sinus rhythm.  He was last seen by Dr. Jacinto Halim on 11/19/2022 and had self discontinued sotalol and was only taking Cardizem and metoprolol.  He was fortunately maintaining sinus rhythm with plan to switch to ASA only if maintaining sinus rhythm in the future.  During today's visit the patient reports*** .  Patient denies chest pain, palpitations, dyspnea, PND, orthopnea, nausea, vomiting, dizziness, syncope, edema, weight gain, or early satiety.  ***Notes: -Last ischemic evaluation: -Last echo: -Interim ED visits: Review of Systems  Please see the history of present illness.    All other systems reviewed and are otherwise negative except as noted above.  Physical Exam    Wt Readings from Last 3 Encounters:  11/19/22 185 lb 9.6 oz (84.2 kg)  08/20/22 179 lb 12.8 oz (81.6 kg)  07/31/22 178 lb (80.7 kg)   WJ:XBJYN were no vitals filed for this visit.,There is no height or weight on file to calculate BMI. GEN: Well nourished, well developed in no acute distress Neck: No JVD; No carotid bruits Pulmonary: Clear to  auscultation without rales, wheezing or rhonchi  Cardiovascular: Normal rate. Regular rhythm. Normal S1. Normal S2.   Murmurs: There is no murmur.  ABDOMEN: Soft, non-tender, non-distended EXTREMITIES:  No edema; No deformity   EKG/LABS/ Recent Cardiac Studies   ECG personally reviewed by me today - ***  Risk Assessment/Calculations:   {Does this patient have ATRIAL FIBRILLATION?:[phone]}      Lab Results  Component Value Date   WBC 12.6 (H) 07/31/2022   HGB 12.9 (L) 07/31/2022   HCT 39.3 07/31/2022   MCV 85 07/31/2022   PLT 497 (H) 07/31/2022   Lab Results  Component Value Date   CREATININE 1.06 07/31/2022   BUN 18 07/31/2022   NA 142 07/31/2022   K 3.5 07/31/2022   CL 104  07/31/2022   CO2 22 07/31/2022   Lab Results  Component Value Date   CHOL 113 07/31/2022   HDL 43 07/31/2022   LDLCALC 45 07/31/2022   TRIG 142 07/31/2022    Lab Results  Component Value Date   HGBA1C 5.2 04/20/2022   Assessment & Plan    1.  Coronary artery disease: -s/p CABG x 1 with SVG to RCA 04/2022  2.  Paroxysmal AF  3.  History of nonrheumatic AS: -s/p AVR repair 04/2022 with [employer] Resilia bioprosthetic valve -SBE prophylaxis discussed  4.  Hypercholesteremia      Disposition: Follow-up with None or APP in *** months {Are you ordering a CV Procedure (e.g. stress test, cath, DCCV, TEE, etc)?   Press F2        :[id number]}   Signed, Napoleon Form, Leodis Rains, NP 06/27/2023, 4:55 PM [facility]

## 2023-06-28 ENCOUNTER — Ambulatory Visit: Payer: 59 | Admitting: Nurse Practitioner

## 2023-06-29 ENCOUNTER — Ambulatory Visit: Payer: 59 | Attending: Cardiology | Admitting: Cardiology

## 2023-06-29 ENCOUNTER — Encounter: Payer: Self-pay | Admitting: Cardiology

## 2023-06-29 VITALS — BP 124/68 | HR 62 | Resp 16 | Ht 67.0 in | Wt 188.2 lb

## 2023-06-29 DIAGNOSIS — Z952 Presence of prosthetic heart valve: Secondary | ICD-10-CM

## 2023-06-29 DIAGNOSIS — E7800 Pure hypercholesterolemia, unspecified: Secondary | ICD-10-CM | POA: Diagnosis not present

## 2023-06-29 DIAGNOSIS — I480 Paroxysmal atrial fibrillation: Secondary | ICD-10-CM | POA: Diagnosis not present

## 2023-06-29 DIAGNOSIS — I2510 Atherosclerotic heart disease of native coronary artery without angina pectoris: Secondary | ICD-10-CM

## 2023-06-29 DIAGNOSIS — I10 Essential (primary) hypertension: Secondary | ICD-10-CM

## 2023-06-29 NOTE — Progress Notes (Signed)
Cardiology Office Note:  .   Date:  06/29/2023  ID:  CORBETT SPITTLE, DOB 1956-05-19, MRN [id number] PCP: Gweneth Dimitri, MD                                                                                                                                            [facility] Providers Cardiologist:  Yates Decamp, MD   History of Present Illness: .   [name] is a 67 y.o. Caucasian male with brain abscess diagnosed in 2012 for which he underwent craniotomy, has mild residual left-sided facial and arm weakness and seizures controlled with Kepra.  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  Also during hospital admission, patient developed severe platypnea-orthodeoxia syndrome and hence underwent successful repair of the atrial septal defect with implantation of a 28 mm CardioSEAL septal occluder on 01/11/2006.  He was also tested positive for DVT and small peripheral PE at that time.     His past medical history significant for hypertension, hyperlipidemia, OSA on CPAP, bronchial asthma, aortic root dilatation and severe aortic regurgitation and chronic stage IIIa kidney disease related to prior NSAID use.  Patient underwent aortic valve replacement using a 25 mm bioprosthetic valve and CABG x1 with SVG to PDA on 04/22/2022, PAF on sotalol and had converted to sinus rhythm.  Discussed the use of AI scribe software for clinical note transcription with the patient, who gave verbal consent to proceed.  History of Present Illness   Tim, a patient with a history of atrial fibrillation, seizures, high blood pressure, and anxiety, presents for a routine follow-up. The patient reports no recent seizures and occasional breakthroughs of atrial fibrillation that resolve on his own. The patient also reports that his heart rate remains elevated for a few hours after exercise, which he monitors  closely. The patient is currently on diltiazem and metoprolol for atrial fibrillation and Eliquis for anticoagulation.  The patient also reports experiencing tremors, which have improved since switching back to Lexapro for anxiety. The patient also reports fatigue in the afternoon, which may be related to his sleep apnea. The patient uses a CPAP machine, but it has been over a year since his last sleep study.  The patient also reports knee pain and is considering knee replacement surgery. The patient has been advised to avoid heavy weightlifting due to his history of aortic valve replacement. The patient also reports occasional difficulty breathing, which may be related to his asthma.       Review of Systems  Cardiovascular:  Negative for chest pain, dyspnea on exertion and leg swelling.    Labs   Lab Results  Component Value Date   CHOL 113 07/31/2022   HDL 43 07/31/2022   LDLCALC 45 07/31/2022  TRIG 142 07/31/2022   Lab Results  Component Value Date   NA 142 07/31/2022   K 3.5 07/31/2022   CO2 22 07/31/2022   GLUCOSE 109 (H) 07/31/2022   BUN 18 07/31/2022   CREATININE 1.06 07/31/2022   CALCIUM 9.5 07/31/2022   EGFR 77 07/31/2022   GFRNONAA >60 05/11/2022      Latest Ref Rng & Units 07/31/2022    3:45 PM 05/11/2022    2:34 PM 05/04/2022    1:14 AM  BMP  Glucose 70 - 99 mg/dL 010  272  536   BUN 8 - 27 mg/dL 18  20  20    Creatinine 0.76 - 1.27 mg/dL 6.44  0.34  7.42   BUN/Creat Ratio 10 - 24 17     Sodium 134 - 144 mmol/L 142  139  136   Potassium 3.5 - 5.2 mmol/L 3.5  3.9  3.9   Chloride 96 - 106 mmol/L 104  106  102   CO2 20 - 29 mmol/L 22  24  26    Calcium 8.6 - 10.2 mg/dL 9.5  9.4  8.6       Latest Ref Rng & Units 07/31/2022    3:45 PM 04/29/2022    5:13 AM 04/28/2022    4:39 AM  CBC  WBC 3.4 - 10.8 x10E3/uL 12.6  11.7  11.2   Hemoglobin 13.0 - 17.7 g/dL 59.5  8.0  8.1   Hematocrit 37.5 - 51.0 % 39.3  24.6  23.3   Platelets 150 - 450 x10E3/uL 497  338  294     External Labs:  Labs 03/02/2023:  96, HDL 45, LDL 33.  TSH normal at 3.020.  Serum creatinine 1.060.  EGFR 63 mL.  Physical Exam:   VS:  BP 124/68 (BP Location: Left Arm, Patient Position: Sitting, Cuff Size: Small)   Pulse 62   Resp 16   Ht 5\' 7"  (1.702 m)   Wt 188 lb 3.2 oz (85.4 kg)   SpO2 96%   BMI 29.48 kg/m    Wt Readings from Last 3 Encounters:  06/29/23 188 lb 3.2 oz (85.4 kg)  11/19/22 185 lb 9.6 oz (84.2 kg)  08/20/22 179 lb 12.8 oz (81.6 kg)     Physical Exam Neck:     Vascular: No carotid bruit or JVD.  Cardiovascular:     Rate and Rhythm: Normal rate and regular rhythm.     Pulses: Intact distal pulses.     Heart sounds: Normal heart sounds. No murmur heard.    No gallop.  Pulmonary:     Effort: Pulmonary effort is normal.     Breath sounds: Normal breath sounds.  Abdominal:     General: Bowel sounds are normal.     Palpations: Abdomen is soft.  Musculoskeletal:     Right lower leg: No edema.     Left lower leg: No edema.    Studies Reviewed: Marland Kitchen    ASD repair 01/11/2006: 1.  Intracardiac echocardiogram. 2.  Closure of the PFO with 28 mm CardioSeal septal occluder.   Right Left Heart Catheterization 03/10/22:  RA 7/5, mean 5 mmHg RV 39/5, EDP 10 mmHg PA 36/17, mean 26 mmHg. PW 21/21, mean 18 mmHg. QP/QS 1.0.  PVR 1.70 Wood units.   LV 155/13, EDP 30 mmHg. Ao 143/69, mean 100 mmHg.  There was no pressure gradient across the aortic valve.   RCA: Anterior origin.  Dominant and continues to the PDA gives origin to very small  PL branches, proximal segment has a 70% stenosis which is focal.  Distal RCA has a tandem 30% stenosis.  Mild disease is present throughout the RCA. LM: Large caliber vessel.  No significant disease. LAD: Large vessel giving origin to a very large D1.  There is mild 20 to 30% stenosis in the proximal to mid segment of the LAD.  Mild disease in the ostium of the D1. LCx: Very large caliber vessel giving origin to a large OM1  which has secondary branches.  Circumflex continues in the AV groove, OM1 is large with mild disease.  Proximal segment has mild ectasia and a 10 to 20% stenosis.   Impression: Mild pulmonary hypertension secondary to elevated EDP, single-vessel coronary artery disease that is of significance involving the right coronary artery which is moderate-sized vessel.  Mild disease is evident in the LAD and CX.    Aortic valve replacement using a 25 mm [employer] Resilia Bioprosthetic Valve  and CABG x1 with SVG to PDA on 04/22/2022      PCV ECHOCARDIOGRAM COMPLETE 09/09/2022   Narrative Echocardiogram 09/09/2022: Left ventricle cavity is normal in size. Mild concentric hypertrophy of the left ventricle. Abnormal septal wall motion due to post-operative valve with mild global hypokinesis. LVEF 45-50%. Normal diastolic filling pattern. S/p AVR (25 mm bioprosthetic valve). Well seated, no pannus or regurgitation. Mean PG 8 mmHg, which is likely normal. Moderate (Grade II) mitral regurgitation. Normal right atrial pressure. The aortic root is mildly dilated at 4.1 cm. This is consistent with CT chest 12/2021. Compared to previous study on 02/04/2022, LV dilatation and AI have resolved post AVR. EKG:    EKG 11/11/2022: Normal sinus rhythm/respiratory at rate of 56 bpm, normal axis nonspecific high lateral T wave abnormality. Normal QT interval.   Medications and allergies    Allergies  Allergen Reactions   Iodinated Contrast Media Anaphylaxis   Iohexol Anaphylaxis and Shortness Of Breath     Code: HIVES, Desc: hives and tachycardia last time pt received IV CM kdean 07/19/06, Onset Date: [id number]   On 9/9 pt had a ct head w/ and received 13hr premedication. Was fine after scan, but broke out in hives when he got home.  Took 100mg  benedryl at home PO, and was fine.    Armour Thyroid [Thyroid]     mouth swelling   Codeine     agitation   Dilantin [Phenytoin] Other (See Comments)    Destroyed  Blood platelets    Heparin Hives    On going therapy    Hydrocodone     Agitation   Nuvigil [Armodafinil]     rash   Other     Follows Kosher Diet; Needs Kosher products   Shellfish Allergy Other (See Comments)    Mouth tingles   Flagyl [Metronidazole] Rash   Rocephin [Ceftriaxone] Rash     Current Outpatient Medications:    albuterol (PROVENTIL HFA;VENTOLIN HFA) 108 (90 BASE) MCG/ACT inhaler, Inhale 2 puffs into the lungs every 6 (six) hours as needed for wheezing or shortness of breath., Disp: , Rfl:    azelastine (ASTELIN) 0.1 % nasal spray, Place 1 spray into both nostrils 2 (two) times daily as needed for allergies., Disp: , Rfl:    diltiazem (CARDIZEM CD) 180 MG 24 hr capsule, Take 1 capsule (180 mg total) by mouth daily., Disp: 90 capsule, Rfl: 3   ELIQUIS 5 MG TABS tablet, TAKE 1 TABLET BY MOUTH TWICE  DAILY, Disp: 180 tablet, Rfl: 3   escitalopram (  LEXAPRO) 10 MG tablet, Take 10 mg by mouth daily., Disp: , Rfl:    FARXIGA 10 MG TABS tablet, TAKE 1 TABLET BY MOUTH DAILY, Disp: 90 tablet, Rfl: 3   fluticasone-salmeterol (ADVAIR HFA) 230-21 MCG/ACT inhaler, Inhale 2 puffs into the lungs 2 (two) times daily., Disp: , Rfl:    irbesartan (AVAPRO) 150 MG tablet, Take 1 tablet (150 mg total) by mouth every evening., Disp: 90 tablet, Rfl: 3   Iron-Vitamin C (VITRON-C PO), Take 1 tablet by mouth daily., Disp: , Rfl:    levETIRAcetam (KEPPRA) 500 MG tablet, Take 500 mg by mouth 2 (two) times daily. , Disp: , Rfl:    levothyroxine (SYNTHROID) 50 MCG tablet, TAKE 1 TABLET BY MOUTH  DAILY (Patient taking differently: Take 50-100 mcg by mouth See admin instructions. 50 mcg daily in the morning, alternating with 100 mcg every other day), Disp: 90 tablet, Rfl: 3   metoprolol succinate (TOPROL-XL) 50 MG 24 hr tablet, Take 1 tablet (50 mg total) by mouth daily. Take with or immediately following a meal., Disp: 90 tablet, Rfl: 3   pantoprazole (PROTONIX) 40 MG tablet, Take 40 mg by mouth every  other day., Disp: , Rfl:    rosuvastatin (CRESTOR) 20 MG tablet, Take 1 tablet (20 mg total) by mouth daily., Disp: , Rfl:    amoxicillin (AMOXIL) 500 MG capsule, Take 4 capsules (2,000 mg total) by mouth once as needed for up to 1 dose (1 hour before dental procedure). (Patient not taking: Reported on 06/29/2023), Disp: 4 capsule, Rfl: 3   ASSESSMENT AND PLAN: .      ICD-10-CM   1. Coronary artery disease involving native coronary artery of native heart without angina pectoris  I25.10     2. S/P  25 mm [employer] Resilia Bioprosthetic Valve and SVG to RCA 04/22/2022  Z95.2     3. Paroxysmal atrial fibrillation (HCC)  I48.0     4. Hypercholesteremia  E78.00     5. Primary hypertension  I10      Click Here to Calculate/Change CHADS2VASc Score The patient's CHADS2-VASc score is 5, indicating a 7.2% annual risk of stroke.  Therefore, anticoagulation is recommended.   CHF History: No HTN History: Yes Diabetes History: No Stroke History: Yes Vascular Disease History: Yes   Assessment and Plan    Atrial Fibrillation No reported episodes of atrial fibrillation. Occasional palpitations post-exercise, but these are not associated with atrial fibrillation. Currently on Diltiazem and Metoprolol for rate control and Eliquis for anticoagulation. -Continue current medications:Diltiazem, Metoprolol, and Eliquis.  Anxiety Reports of anxiety, currently managed with Lexapro 10mg . -Continue Lexapro 10mg  daily.  Hypertension Well-controlled with current medications. -Continue current antihypertensive medications:Metoprolol, Diltiazem, and Irbesartan.  Sleep Apnea Reports of afternoon fatigue, possibly related to sleep apnea. Currently using CPAP. -Consider repeat sleep study to reassess CPAP settings.  Heavy Weightlifting Reports of heavy weightlifting, which is not recommended due to previous aortic valve replacement. -Advise to reduce weightlifting intensity to prevent potential  aortic injury.  CAD No angina.  Metoprolol succinate + Crestor on board  Knee Pain Reports of knee pain, considering knee replacement surgery in the next year. -Endorsement for knee replacement surgery when patient is ready. Will provide necessary clearance and instructions for perioperative anticoagulation management.  Follow-up in 1 year.    Signed,  Yates Decamp, MD, [facility] 06/29/2023, 9:12 PM [facility] [address] [place], [place] [address] Phone: [phone]. Fax:  [phone]

## 2023-06-29 NOTE — Patient Instructions (Signed)

## 2023-07-01 ENCOUNTER — Encounter: Payer: Self-pay | Admitting: Thoracic Surgery (Cardiothoracic Vascular Surgery)

## 2023-07-20 ENCOUNTER — Other Ambulatory Visit: Payer: Self-pay | Admitting: Cardiology

## 2023-07-20 DIAGNOSIS — I10 Essential (primary) hypertension: Secondary | ICD-10-CM

## 2023-07-28 ENCOUNTER — Other Ambulatory Visit: Payer: Self-pay | Admitting: Physician Assistant

## 2023-07-28 DIAGNOSIS — M19011 Primary osteoarthritis, right shoulder: Secondary | ICD-10-CM

## 2023-08-22 ENCOUNTER — Encounter: Payer: Self-pay | Admitting: Cardiology

## 2023-08-23 ENCOUNTER — Other Ambulatory Visit: Payer: No Typology Code available for payment source

## 2023-08-24 ENCOUNTER — Other Ambulatory Visit: Payer: Self-pay | Admitting: Urology

## 2023-08-24 DIAGNOSIS — R972 Elevated prostate specific antigen [PSA]: Secondary | ICD-10-CM

## 2023-08-24 MED ORDER — METOPROLOL SUCCINATE ER 25 MG PO TB24: 25.0000 mg | ORAL_TABLET | Freq: Every day | ORAL | Status: DC

## 2023-10-03 ENCOUNTER — Other Ambulatory Visit: Payer: Self-pay | Admitting: Cardiology

## 2023-10-03 DIAGNOSIS — I480 Paroxysmal atrial fibrillation: Secondary | ICD-10-CM

## 2023-10-07 ENCOUNTER — Ambulatory Visit
Admission: RE | Admit: 2023-10-07 | Discharge: 2023-10-07 | Disposition: A | Discharge status: Home / Self Care | Payer: No Typology Code available for payment source | Source: Ambulatory Visit | Attending: Urology

## 2023-10-07 DIAGNOSIS — R972 Elevated prostate specific antigen [PSA]: Secondary | ICD-10-CM

## 2023-10-07 MED ORDER — GADOPICLENOL 0.5 MMOL/ML IV SOLN
9.0000 mL | Freq: Once | INTRAVENOUS | Status: AC | PRN
  Administered 2023-10-07: 9 mL via INTRAVENOUS

## 2023-10-12 ENCOUNTER — Encounter: Payer: Self-pay | Admitting: Cardiology

## 2023-12-12 ENCOUNTER — Encounter: Payer: Self-pay | Admitting: Cardiology

## 2024-01-09 ENCOUNTER — Encounter: Payer: Self-pay | Admitting: Cardiology

## 2024-01-17 ENCOUNTER — Other Ambulatory Visit: Payer: Self-pay | Admitting: Cardiology

## 2024-01-17 DIAGNOSIS — I480 Paroxysmal atrial fibrillation: Secondary | ICD-10-CM

## 2024-01-17 NOTE — Telephone Encounter (Signed)
 Prescription refill request for Eliquis  received. Indication: PAF Last office visit: 06/29/23  JINNY Bergamo MD Scr: 1.06 on 07/31/22  Epic Age: 68 Weight: 85.4kg  Based on above findings Eliquis  5mg  twice daily is the appropriate dose.  Refill approved.

## 2024-03-23 ENCOUNTER — Encounter: Payer: Self-pay | Admitting: Cardiology

## 2024-05-15 ENCOUNTER — Telehealth (HOSPITAL_BASED_OUTPATIENT_CLINIC_OR_DEPARTMENT_OTHER): Payer: Self-pay

## 2024-05-15 NOTE — Telephone Encounter (Signed)
   Pre-operative Risk Assessment    Patient Name: [name]  DOB: 24-Sep-1955 MRN: [id number]   Date of last office visit: 06/29/23 with Dr. Ladona Date of next office visit: 08/03/24 with [facility]   Request for Surgical Clearance    Procedure:  Right Total Knee Arthroplasty Date of Surgery:  Clearance TBD                                 Surgeon:  Dr. Redell Shoals Surgeon's Group or Practice Name:  [facility] Phone number:  [phone] Fax number:  [phone]   Type of Clearance Requested:   - Medical  - Pharmacy:  Hold Apixaban  (Eliquis ) not indicated   Type of Anesthesia:  Spinal   Additional requests/questions:    Bonney Augustin JONETTA Delores   05/15/2024, 8:57 AM

## 2024-05-16 NOTE — Telephone Encounter (Signed)
 Labs From [facility] 12/11/23  Cr 1.15 eGFR 70 Platelets 234

## 2024-05-23 ENCOUNTER — Telehealth (HOSPITAL_BASED_OUTPATIENT_CLINIC_OR_DEPARTMENT_OTHER): Payer: Self-pay | Admitting: *Deleted

## 2024-05-23 NOTE — Telephone Encounter (Signed)
 Pt returning call to schedule TELE appt. Please advise

## 2024-05-23 NOTE — Telephone Encounter (Signed)
(  1st attempt) Left message for pt to call our office and ask for the preop team to schedule TELE preop appt.

## 2024-05-23 NOTE — Telephone Encounter (Signed)
 Tried to call pt back but got VM again.

## 2024-05-23 NOTE — Telephone Encounter (Signed)
 Pt has been scheduled tele preop appt 06/26/24. Med rec and consent are done.       Patient Consent for Virtual Visit        [name] has provided verbal consent on 05/23/2024 for a virtual visit (video or telephone).   CONSENT FOR VIRTUAL VISIT FOR:  [name]  By participating in this virtual visit I agree to the following:  I hereby voluntarily request, consent and authorize [facility] and its employed or contracted physicians, physician assistants, nurse practitioners or other licensed health care professionals (the Practitioner), to provide me with telemedicine health care services (the "Services) as deemed necessary by the treating Practitioner. I acknowledge and consent to receive the Services by the Practitioner via telemedicine. I understand that the telemedicine visit will involve communicating with the Practitioner through live audiovisual communication technology and the disclosure of certain medical information by electronic transmission. I acknowledge that I have been given the opportunity to request an in-person assessment or other available alternative prior to the telemedicine visit and am voluntarily participating in the telemedicine visit.  I understand that I have the right to withhold or withdraw my consent to the use of telemedicine in the course of my care at any time, without affecting my right to future care or treatment, and that the Practitioner or I may terminate the telemedicine visit at any time. I understand that I have the right to inspect all information obtained and/or recorded in the course of the telemedicine visit and may receive copies of available information for a reasonable fee.  I understand that some of the potential risks of receiving the Services via telemedicine include:  Delay or interruption in medical evaluation due to technological equipment failure or disruption; Information transmitted may not be sufficient (e.g. poor  resolution of images) to allow for appropriate medical decision making by the Practitioner; and/or  In rare instances, security protocols could fail, causing a breach of personal health information.  Furthermore, I acknowledge that it is my responsibility to provide information about my medical history, conditions and care that is complete and accurate to the best of my ability. I acknowledge that Practitioner's advice, recommendations, and/or decision may be based on factors not within their control, such as incomplete or inaccurate data provided by me or distortions of diagnostic images or specimens that may result from electronic transmissions. I understand that the practice of medicine is not an exact science and that Practitioner makes no warranties or guarantees regarding treatment outcomes. I acknowledge that a copy of this consent can be made available to me via my patient portal [facility] MyChart), or I can request a printed copy by calling the office of [facility].    I understand that my insurance will be billed for this visit.   I have read or had this consent read to me. I understand the contents of this consent, which adequately explains the benefits and risks of the Services being provided via telemedicine.  I have been provided ample opportunity to ask questions regarding this consent and the Services and have had my questions answered to my satisfaction. I give my informed consent for the services to be provided through the use of telemedicine in my medical care

## 2024-05-23 NOTE — Telephone Encounter (Signed)
 Patient with diagnosis of atrial fibrillation on Eliquis  for anticoagulation.    Procedure:  Right Total Knee Arthroplasty Date of Surgery:  Clearance TBD      CHA2DS2-VASc Score = 5   This indicates a 7.2% annual risk of stroke. The patient's score is based upon: CHF History: 0 HTN History: 1 Diabetes History: 0 Stroke History: 2 Vascular Disease History: 1 Age Score: 1 Gender Score: 0   Stroke - brain abscess > 10 years ago PFO closure 2007 (had PE/DVT at this time)  CrCl 74  Platelet count 234  Patient has not had an Afib/aflutter ablation in the last 3 months, DCCV within the last 4 weeks or a watchman implanted in the last 45 days   Per office protocol, patient can hold Eliquis  for 3 days prior to procedure.   Patient will not need bridging with Lovenox  (enoxaparin ) around procedure.  **This guidance is not considered finalized until pre-operative APP has relayed final recommendations.**

## 2024-05-23 NOTE — Telephone Encounter (Signed)
 Pt has been scheduled tele preop appt 06/26/24. Med rec and consent are done.

## 2024-05-23 NOTE — Telephone Encounter (Signed)
   Name: [name]  DOB: August 19, 1955  MRN: [id number]  Primary Cardiologist: Gordy Bergamo, MD   Preoperative team, please contact this patient and set up a phone call appointment for further preoperative risk assessment. Please obtain consent and complete medication review. Thank you for your help.  I confirm that guidance regarding antiplatelet and oral anticoagulation therapy has been completed and, if necessary, noted below.  Per Pharm D, patient has not had an Afib/aflutter ablation within the last 3 months, DCCV within the last 4 weeks, or Watchman in the last 45 days. Patient may hold Eliquis  for 3 days prior to procedure.   Patient will not need bridging with Lovenox  around procedure.    I also confirmed the patient resides in the state of [place] . As per [facility] [employer] telemedicine laws, the patient must reside in the state in which the provider is licensed.    Barnie Hila, NP 05/23/2024, 8:35 AM [facility]

## 2024-06-10 ENCOUNTER — Other Ambulatory Visit: Payer: Self-pay | Admitting: Cardiology

## 2024-06-10 DIAGNOSIS — I480 Paroxysmal atrial fibrillation: Secondary | ICD-10-CM

## 2024-06-15 NOTE — Telephone Encounter (Signed)
 Prescription refill request for Eliquis  received. Indication: A. FIB Last office visit: 06/29/23 Scr: 1.15 (12/16/23 PCP notes in Media) Age: 68 Weight: 85.4 kg

## 2024-06-26 ENCOUNTER — Ambulatory Visit: Attending: Cardiology | Admitting: Physician Assistant

## 2024-06-26 DIAGNOSIS — Z01810 Encounter for preprocedural cardiovascular examination: Secondary | ICD-10-CM

## 2024-06-26 NOTE — Progress Notes (Signed)
 Virtual Visit via Telephone Note   Because of KORD MONETTE co-morbid illnesses, he is at least at moderate risk for complications without adequate follow up.  This format is felt to be most appropriate for this patient at this time.  Due to technical limitations with video connection (technology), today's appointment will be conducted as an audio only telehealth visit, and [name] verbally agreed to proceed in this manner.   All issues noted in this document were discussed and addressed.  No physical exam could be performed with this format.  Evaluation Performed:  Preoperative cardiovascular risk assessment _____________   Date:  06/26/2024   Patient ID:  [name], DOB 03-02-1956, MRN [id number] Patient Location:  Home Provider location:   Office  Primary Care Provider:  Aisha Harvey, MD Primary Cardiologist:  Gordy Bergamo, MD  Chief Complaint / Patient Profile   68 y.o. y/o male with a h/o AVR and CABG x 1,  hypertension, hyperlipidemia, OSA on CPAP, bronchial asthma, hx of PE after a brain surgery, PFO closure, aortic root dilatation and severe aortic regurgitation and chronic stage IIIa kidney disease related to prior NSAID use  who is pending right total knee arthoplasty and presents today for telephonic preoperative cardiovascular risk assessment.  History of Present Illness    [name] is a 68 y.o. male who presents via audio/video conferencing for a telehealth visit today.  Pt was last seen in cardiology clinic on 06/29/23 by Dr. Bergamo.  At that time [name] was doing well.  The patient is now pending procedure as outlined above. Since his last visit, he has been doing well without another episode of Afib. He has been on Diltiazem  and Eliquis  without issues. He has been exercising a few days a week. He does some work on the bike, he sprints on the bike.    Per office protocol, patient can hold Eliquis  for 3 days prior to procedure.  He can  resume when medically safe to do.  Patient will not need bridging with Lovenox  (enoxaparin ) around procedure.   Past Medical History    Past Medical History:  Diagnosis Date   ADHD    Anginal pain    Anxiety    Arthritis    Asthma    excerise induced   Brain abscess    Chronic renal insufficiency, stage III (moderate)    Coronary artery disease    DVT (deep venous thrombosis) (HCC)    Esophageal reflux    History of osteomyelitis    History of transfusion of platelets    Hx of pulmonary embolus    x 2   Hyperlipidemia    Hypertension    Hypothyroidism    Neuropathy    Patent foramen ovale    S/P  Aortic valve replacement using a 25mm Inspiris Pericardial valve 04/22/2022 04/22/2022   Seizures (HCC)    Sleep apnea    Sleep study done 5-6  years ago machine set at 10   Past Surgical History:  Procedure Laterality Date   ANTERIOR CRUCIATE LIGAMENT REPAIR     AORTIC VALVE REPLACEMENT N/A 04/22/2022   Procedure: AORTIC VALVE REPLACEMENT (AVR) USING 25 MM INSPIRIS RESILIA  AORTIC VALVE;  Surgeon: Maryjane Mt, MD;  Location: [facility] OR;  Service: Open Heart Surgery;  Laterality: N/A;   BRAIN SURGERY     abscess drainage x 2   CARDIOVERSION N/A 04/30/2022   Procedure: CARDIOVERSION;  Surgeon: Jeffrie Oneil BROCKS, MD;  Location: [facility]  ENDOSCOPY;  Service: Cardiovascular;  Laterality: N/A;   CARPAL TUNNEL RELEASE     CORONARY ARTERY BYPASS GRAFT N/A 04/22/2022   Procedure: CORONARY ARTERY BYPASS GRAFTING (CABG) TIMES ONE USE THE ENDOSCOPICALLY HARVESTED RIGHT GREATER SAPHENOUS VEIN;  Surgeon: Maryjane Mt, MD;  Location: [facility] OR;  Service: Open Heart Surgery;  Laterality: N/A;  Open midline sternotomy   CRANIOTOMY     CRANIOTOMY  03/30/2012   Procedure: CRANIOTOMY TUMOR EXCISION;  Surgeon: Rockey LITTIE Peru, MD;  Location: [facility] NEURO ORS;  Service: Neurosurgery;  Laterality: Right;   craniotomy for intracranial mass.   CRANIOTOMY  03/31/2012   Procedure: CRANIOTOMY TUMOR EXCISION;  Surgeon: Rockey LITTIE Peru, MD;  Location: [facility] NEURO ORS;  Service: Neurosurgery;  Laterality: Right;  Craniotomy for excision of mass   Deep Vein     FRACTURE SURGERY     right arm   LAPAROSCOPIC APPENDECTOMY  11/20/2011   LAPAROSCOPIC APPENDECTOMY  11/20/2011   Procedure: APPENDECTOMY LAPAROSCOPIC;  Surgeon: Vicenta DELENA Poli, MD;  Location: [facility] ORS;  Service: General;  Laterality: N/A;   PATENT FORAMEN OVALE CLOSURE     Pulmonary Embolus     RIGHT/LEFT HEART CATH AND CORONARY ANGIOGRAPHY N/A 03/10/2022   Procedure: RIGHT/LEFT HEART CATH AND CORONARY ANGIOGRAPHY;  Surgeon: Ladona Heinz, MD;  Location: [facility] INVASIVE CV LAB;  Service: Cardiovascular;  Laterality: N/A;   TEE WITHOUT CARDIOVERSION N/A 04/22/2022   Procedure: TRANSESOPHAGEAL ECHOCARDIOGRAM (TEE);  Surgeon: Maryjane Mt, MD;  Location[facility];  Service: Open Heart Surgery;  Laterality: N/A;   TEE WITHOUT CARDIOVERSION N/A 04/30/2022   Procedure: TRANSESOPHAGEAL ECHOCARDIOGRAM (TEE);  Surgeon: Jeffrie Oneil BROCKS, MD;  Location: [facility] ENDOSCOPY;  Service: Cardiovascular;  Laterality: N/A;   ULNAR TUNNEL RELEASE      Allergies  Allergies[1]  Home Medications    Prior to Admission medications  Medication Sig Start Date End Date Taking? Authorizing Provider  albuterol  (PROVENTIL  HFA;VENTOLIN  HFA) 108 (90 BASE) MCG/ACT inhaler Inhale 2 puffs into the lungs every 6 (six) hours as needed for wheezing or shortness of breath.    [provider]  amoxicillin  (AMOXIL ) 500 MG capsule Take 4 capsules (2,000 mg total) by mouth once as needed for up to 1 dose (1 hour before dental procedure). Patient taking differently: Take 2,000 mg by mouth once as needed (1 hour before dental procedure). PRN FOR DENTAL PROCEDURE 10/14/22   Ladona Heinz, MD  apixaban  (ELIQUIS ) 5 MG TABS tablet Take 1 tablet (5 mg total) by mouth 2 (two) times daily. Please keep your schedule appointment with Dr. Ladona in January to receive further refills. Thank you. 06/15/24   Ladona Heinz, MD   azelastine  (ASTELIN ) 0.1 % nasal spray Place 1 spray into both nostrils 2 (two) times daily as needed for allergies. 11/27/13   [provider]  diltiazem  (CARTIA  XT) 180 MG 24 hr capsule Take 1 capsule (180 mg total) by mouth daily. Please keep your schedule appointment with Dr. Ladona in January to receive further refills. Thank you. 06/15/24   Ladona Heinz, MD  escitalopram  (LEXAPRO ) 10 MG tablet Take 10 mg by mouth daily.    [provider]  FARXIGA  10 MG TABS tablet TAKE 1 TABLET BY MOUTH DAILY 07/21/23   Ladona Heinz, MD  fluticasone -salmeterol (ADVAIR  HFA) 230-21 MCG/ACT inhaler Inhale 2 puffs into the lungs 2 (two) times daily.    [provider]  irbesartan  (AVAPRO ) 150 MG tablet TAKE 1 TABLET BY MOUTH IN THE  EVENING Patient taking differently: Take  75 mg by mouth every morning. 07/21/23   Ladona Heinz, MD  Iron-Vitamin C (VITRON-C PO) Take 1 tablet by mouth daily. Patient not taking: Reported on 05/23/2024    [provider]  levETIRAcetam  (KEPPRA ) 500 MG tablet Take 500 mg by mouth 2 (two) times daily.     [provider]  levothyroxine  (SYNTHROID ) 50 MCG tablet TAKE 1 TABLET BY MOUTH  DAILY Patient taking differently: Take 50-100 mcg by mouth See admin instructions. 100 MCG M-F AND 50 MCG SAT AND SUN 12/05/20   Shamleffer, Donell Cardinal, MD  metoprolol  succinate (TOPROL  XL) 25 MG 24 hr tablet Take 1 tablet (25 mg total) by mouth daily. Patient not taking: Reported on 05/23/2024 08/24/23   Ladona Heinz, MD  pantoprazole  (PROTONIX ) 40 MG tablet Take 40 mg by mouth every other day.    [provider]  rosuvastatin  (CRESTOR ) 20 MG tablet Take 1 tablet (20 mg total) by mouth daily. 08/20/22   Ladona Heinz, MD    Physical Exam    Vital Signs:  [name] does not have vital signs available for review today.  Given telephonic nature of communication, physical exam is limited. AAOx3. NAD. Normal affect.  Speech and respirations are  unlabored.  Accessory Clinical Findings    None  Assessment & Plan    1.  Preoperative Cardiovascular Risk Assessment:  [name] perioperative risk of a major cardiac event is 6.6% according to the Revised Cardiac Risk Index (RCRI).  Therefore, he is at high risk for perioperative complications.   His functional capacity is good at 6.36 METs according to the Duke Activity Status Index (DASI). Recommendations: According to ACC/AHA guidelines, no further cardiovascular testing needed.  The patient may proceed to surgery at acceptable risk.   Antiplatelet and/or Anticoagulation Recommendations:  Eliquis  (Apixaban ) can be held for 3 days prior to surgery.  Please resume post op when felt to be safe.     The patient was advised that if he develops new symptoms prior to surgery to contact our office to arrange for a follow-up visit, and he verbalized understanding.   A copy of this note will be routed to requesting surgeon.  Time:   Today, I have spent 8 minutes with the patient with telehealth technology discussing medical history, symptoms, and management plan.     Leroy LOISE Fabry, PA-C  06/26/2024, 10:21 AM     [1]  Allergies Allergen Reactions   Iodinated Contrast Media Anaphylaxis   Iohexol  Anaphylaxis and Shortness Of Breath     Code: HIVES, Desc: hives and tachycardia last time pt received IV CM kdean 07/19/06, Onset Date: [id number]   On 9/9 pt had a ct head w/ and received 13hr premedication. Was fine after scan, but broke out in hives when he got home.  Took 100mg  benedryl at home PO, and was fine.    Armour Thyroid  [Thyroid ]     mouth swelling   Codeine     agitation   Dilantin [Phenytoin] Other (See Comments)    Destroyed Blood platelets    Heparin  Hives    On going therapy    Hydrocodone      Agitation   Nuvigil [Armodafinil]     rash   Other     Follows Kosher Diet; Needs Kosher products   Shellfish Allergy Other (See Comments)    Mouth tingles   Flagyl   [Metronidazole ] Rash   Rocephin [Ceftriaxone] Rash

## 2024-07-11 ENCOUNTER — Telehealth: Payer: Self-pay | Admitting: Cardiology

## 2024-07-11 DIAGNOSIS — Z01810 Encounter for preprocedural cardiovascular examination: Secondary | ICD-10-CM

## 2024-07-11 NOTE — Telephone Encounter (Signed)
"  ° °  Pre-operative Risk Assessment    Patient Name: [name]  DOB: May 20, 1956 MRN: [id number]   Date of last office visit: 06/29/23 Date of next office visit: 08/03/24   Request for Surgical Clearance    Procedure:  prostate biopsy for elevated PSA levels    Date of Surgery:  Clearance TBD                                Surgeon:  Dr. Shane Socks Group or Practice Name:  [facility]  Phone number:  [phone] Fax number:  [phone]   Type of Clearance Requested:   - Medical  - Pharmacy:  Hold Apixaban  (Eliquis ) 3 days    Type of Anesthesia:  Local    Additional requests/questions:    [name]   07/11/2024, 4:42 PM   "

## 2024-07-12 NOTE — Telephone Encounter (Signed)
 Will need to have PCP send us  copy. Did not see anything in [facility].

## 2024-07-12 NOTE — Addendum Note (Signed)
 Addended by: GEROME IVAL SAILOR on: 07/12/2024 04:21 PM   Modules accepted: Orders

## 2024-07-12 NOTE — Telephone Encounter (Signed)
 Patient needs CBC and BMET for pharmacy recommendations on holding Eliquis  please. Last labs were in 07/2022. Thank you.

## 2024-07-12 NOTE — Telephone Encounter (Signed)
 Lab orders have been placed. Attempted to contact the pt to inform him that the labs will need to be done. LVMFCB

## 2024-07-12 NOTE — Telephone Encounter (Signed)
 Patient returned Pre-op call and stated he completed BMET and CBC lab work on 12/4 at his PCP's office and wants to know if he still needs to have further lab work.

## 2024-07-14 NOTE — Telephone Encounter (Signed)
 Lab results received from PCP. I will update the preop APP and pharm-d with the BMP and CBC results.    BMET: 06/15/24  BUN 28 CREATININE 1.09 K+ 4.4  CBC: 06/15/24 WBC 7.3 HGB 14.4 PLT 258

## 2024-07-14 NOTE — Telephone Encounter (Signed)
 I s/w the PCP office and left a verbal message with staff for nurse to call back about lab results we are inquiring about. Pt stated had labs done with PCP, see previous notes. Our office will need to confirm if pt had a BMET and CBC, if so is these results can be faxed to our office [phone] preop team. If not then pt will need to still have labs done per recommendations from preop APP.

## 2024-07-14 NOTE — Telephone Encounter (Signed)
 PCP office called and said they are faxing over labs the pt had done. I did confirm if the pt had BMET and CBC, it was stated yes.

## 2024-07-17 NOTE — Telephone Encounter (Signed)
 Pharmacy please advise on holding Eliquis  for 3 days prior to prostate biopsy for elevated PSA levels   scheduled for TBD. Thank you.  Labs have been sent from PCP office. See note thread below. Thank you

## 2024-07-18 ENCOUNTER — Encounter: Payer: Self-pay | Admitting: Cardiology

## 2024-07-18 NOTE — Telephone Encounter (Signed)
" ° °  Name: [name]  DOB: 1956/05/30  MRN: [id number]  Primary Cardiologist: Gordy Bergamo, MD  Chart reviewed as part of pre-operative protocol coverage. The patient has an upcoming visit scheduled with Dr. Bergamo on 08/03/24 at which time clearance can be addressed in case there are any issues that would impact surgical recommendations. It has been past one year seen being seen in office, he requires office visit.   Prostate biopsy is not scheduled as below. I added preop FYI to appointment note so that provider is aware to address at time of outpatient visit.  Per office protocol the cardiology provider should forward their finalized clearance decision and recommendations regarding antiplatelet therapy to the requesting party below.    Per office protocol, patient can hold Eliquis  for 3 days prior to procedure.  Patient will not need bridging with Lovenox  (enoxaparin ) around procedure.  I will route this message as FYI to requesting party and remove this message from the preop box as separate preop APP input not needed at this time.   Please call with any questions.  Nohely Whitehorn D Briony Parveen, NP  07/18/2024, 4:08 PM   "

## 2024-07-18 NOTE — Telephone Encounter (Signed)
 Will update all parties involved pt has appt 08/03/24 DR. Ganji, see notes .

## 2024-07-18 NOTE — Telephone Encounter (Signed)
 Patient with diagnosis of afib on Eliquis  for anticoagulation.    Procedure:  prostate biopsy for elevated PSA levels     Date of Surgery:  Clearance TBD   CHA2DS2-VASc Score = 5   This indicates a 7.2% annual risk of stroke. 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    Patient will not need bridging with Lovenox  (enoxaparin ) around procedure.  **This guidance is not considered finalized until pre-operative APP has relayed final recommendations.**

## 2024-07-18 NOTE — Telephone Encounter (Signed)
 Requesting office inquiring if pt has been cleared. Per notes on form the procedure will not be scheduled until the pt has been cleared.

## 2024-07-21 LAB — LAB REPORT - SCANNED
EGFR: 74
TSH: 3.91

## 2024-07-27 ENCOUNTER — Other Ambulatory Visit (HOSPITAL_COMMUNITY): Payer: Self-pay

## 2024-07-27 MED ORDER — PREDNISONE 10 MG PO TABS
20.0000 mg | ORAL_TABLET | Freq: Every day | ORAL | 0 refills | Status: AC
  Filled 2024-07-27: qty 12, 6d supply, fill #0

## 2024-07-28 ENCOUNTER — Other Ambulatory Visit (HOSPITAL_COMMUNITY): Payer: Self-pay

## 2024-07-31 ENCOUNTER — Ambulatory Visit: Admitting: Cardiology

## 2024-08-03 ENCOUNTER — Encounter: Payer: Self-pay | Admitting: Cardiology

## 2024-08-03 ENCOUNTER — Ambulatory Visit: Payer: Self-pay | Admitting: Cardiology

## 2024-08-03 ENCOUNTER — Ambulatory Visit: Payer: Self-pay | Attending: Cardiology | Admitting: Cardiology

## 2024-08-03 VITALS — BP 94/60 | HR 85 | Ht 67.0 in | Wt 174.0 lb

## 2024-08-03 DIAGNOSIS — I2510 Atherosclerotic heart disease of native coronary artery without angina pectoris: Secondary | ICD-10-CM | POA: Diagnosis present

## 2024-08-03 DIAGNOSIS — Z01810 Encounter for preprocedural cardiovascular examination: Secondary | ICD-10-CM | POA: Insufficient documentation

## 2024-08-03 DIAGNOSIS — Z952 Presence of prosthetic heart valve: Secondary | ICD-10-CM | POA: Diagnosis present

## 2024-08-03 DIAGNOSIS — I480 Paroxysmal atrial fibrillation: Secondary | ICD-10-CM | POA: Diagnosis present

## 2024-08-03 DIAGNOSIS — Z2989 Encounter for other specified prophylactic measures: Secondary | ICD-10-CM | POA: Insufficient documentation

## 2024-08-03 NOTE — Progress Notes (Signed)
 " Cardiology Office Note:  .   Date:  08/05/2024  ID:  [name], DOB [birth year], MRN [id number] PCP: Aisha Harvey, MD  [facility] Providers Cardiologist:  Gordy Bergamo, MD   History of Present Illness: .   [name] is a 69 y.o. Caucasian male with brain abscess diagnosed in 2012 for which he underwent craniotomy, has mild residual left-sided facial and arm weakness and seizures controlled with Kepra.  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  Also during hospital admission, patient developed severe platypnea-orthodeoxia syndrome and hence underwent successful repair of the atrial septal defect with implantation of a 28 mm CardioSEAL septal occluder on 01/11/2006.  He was also tested positive for DVT and small peripheral PE at that time.     His past medical history significant for hypertension, hyperlipidemia, OSA on CPAP, bronchial asthma, aortic root dilatation and severe aortic regurgitation and chronic stage IIIa kidney disease related to prior NSAID use.  Patient underwent aortic valve replacement using a 25 mm bioprosthetic valve and CABG x1 with SVG to PDA on 04/22/2022, PAF on sotalol  and had converted to sinus rhythm.  Presently asymptomatic.    Discussed the use of AI scribe software for clinical note transcription with the patient, who gave verbal consent to proceed.  History of Present Illness [name] is a 69 year old male who presents for pre-procedure evaluation for a prostate biopsy.  He is scheduled for a prostate biopsy on February 2nd. He feels tired and recently noted a low blood pressure of 90/60 mmHg with 15-pound weight loss, now 174 pounds.  He has paroxysmal atrial fibrillation and takes Eliquis  twice daily. He developed palpitations and an irregular heart rate while on prednisone , which was stopped. His last atrial fibrillation  episode was over a year ago and his recent EKG was normal.  He has coronary artery disease, prior aortic valve replacement for aortic regurgitation, and a graft to the SVG to RCA on April 22, 2022. He exercises regularly with cycling and weight training three times weekly without angina. His LDL was 67 mg/dL in May 7974.  He recently had low iron levels and restarted iron supplements. His kidney function is normal.  Cardiac Studies relevent.   ASD repair 01/11/2006: 1.  Intracardiac echocardiogram. 2.  Closure of the PFO with 28 mm CardioSeal septal occluder.  Right Left Heart Catheterization 03/10/22:  Mild pulm hypertension due to elevated EDP   Echocardiogram 09/09/2022: Left ventricle cavity is normal in size. Mild concentric hypertrophy of the left ventricle. Abnormal septal wall motion due to post-operative valve with mild global hypokinesis. LVEF 45-50%. Normal diastolic filling pattern. S/p AVR (25 mm bioprosthetic valve). Well seated, no pannus or regurgitation. Mean PG 8 mmHg, which is likely normal. Moderate (Grade II) mitral regurgitation. Normal right atrial pressure. The aortic root is mildly dilated at 4.1 cm. This is consistent with CT chest 12/2021.  EKG:   EKG Interpretation Date/Time:  Thursday August 03 2024 10:44:29 EST Ventricular Rate:  76 PR Interval:  162 QRS Duration:  108 QT Interval:  392 QTC Calculation: 441 R Axis:   35  Text Interpretation: EKG 08/03/2024: Normal sinus rhythm at rate of 76 bpm, normal axis, normal EKG.  Compared to 05/11/2022, nonspecific lateral T wave abnormality not present and rightward axis not present. Confirmed by Meggan Dhaliwal, Jagadeesh ([id number]) on 08/03/2024 10:47:29  AM  Labs    Care everywhere/Faxed External Labs:  Labs 06/15/2024:  TSH normal at 3.91.  Hb 14.4/HCT 43.4, platelets 258.  Serum glucose 83 mg, BUN 28, creatinine 1.09, eGFR 74 mL, potassium 4.4, LFTs normal.  Labs 12/11/2023:  Total cholesterol 154,  triglycerides 101, HDL 69, LDL 67.  ROS  Review of Systems  Cardiovascular:  Negative for chest pain, dyspnea on exertion and leg swelling.   Physical Exam:   VS:  BP 94/60 (BP Location: Left Arm, Patient Position: Sitting, Cuff Size: Normal)   Pulse 85   Ht 5' 7 (1.702 m)   Wt 174 lb (78.9 kg)   SpO2 99%   BMI 27.25 kg/m    Wt Readings from Last 3 Encounters:  08/03/24 174 lb (78.9 kg)  06/29/23 188 lb 3.2 oz (85.4 kg)  11/19/22 185 lb 9.6 oz (84.2 kg)    BP Readings from Last 3 Encounters:  08/03/24 94/60  06/29/23 124/68  11/19/22 107/68   Physical Exam Neck:     Vascular: No carotid bruit or JVD.  Cardiovascular:     Rate and Rhythm: Normal rate and regular rhythm.     Pulses: Intact distal pulses.     Heart sounds: Normal heart sounds. No murmur heard.    No gallop.  Pulmonary:     Effort: Pulmonary effort is normal.     Breath sounds: Normal breath sounds.  Abdominal:     General: Bowel sounds are normal.     Palpations: Abdomen is soft.  Musculoskeletal:     Right lower leg: No edema.     Left lower leg: No edema.    ASSESSMENT AND PLAN: .      ICD-10-CM   1. Preop cardiovascular exam  Z01.810 EKG 12-Lead    2. Paroxysmal atrial fibrillation (HCC)  I48.0     3. S/P  25 mm [employer] Resilia Bioprosthetic Valve and SVG to RCA 04/22/2022  Z95.2     4. Coronary artery disease involving native coronary artery of native heart without angina pectoris  I25.10     5. SBE (subacute bacterial endocarditis) prophylaxis candidate  Z29.89      Assessment & Plan Preprocedural cardiovascular evaluation for prostate biopsy Scheduled for prostate biopsy on February 2nd. Low cardiovascular risk for the procedure. Normal EKG and well-controlled cholesterol levels. Kidney function is normal, allowing for shorter anticoagulation hold. - Stop Eliquis  two days prior to the procedure (January 31st) - Continue current medications including Zyathin  Paroxysmal  atrial fibrillation Managed with Eliquis . Last episode over a year ago. EKG is normal, indicating good control. - Continue Eliquis  twice daily, except for two days prior to the prostate biopsy  Atherosclerotic coronary artery disease without angina Coronary artery disease with previous aortic valve replacement and SVG to RCA graft. No current angina. Cholesterol levels well controlled with LDL at 67 mg/dL. - Continue current management and lifestyle modifications  Presence of prosthetic aortic valve Prosthetic aortic valve in place due to previous aortic regurgitation. - Continue routine monitoring and management  Subacute bacterial endocarditis prophylaxis candidate Candidate for dental prophylaxis due to prosthetic aortic valve. - Ensure antibiotics are available for dental procedures   Follow up: 1 Year or sooner if problems  Signed,  Gordy Bergamo, MD, [facility] 08/05/2024, 8:41 PM [facility] [address] [place], [place] [address] Phone: [phone]. Fax:  [phone]  "

## 2024-08-03 NOTE — Patient Instructions (Signed)
 Medication Instructions:  Your physician recommends that you continue on your current medications as directed. Please refer to the Current Medication list given to you today.  *If you need a refill on your cardiac medications before your next appointment, please call your pharmacy*  Lab Work: Lab Orders  No laboratory test(s) ordered today    If you have labs (blood work) drawn today and your tests are completely normal, you will receive your results only by: MyChart Message (if you have MyChart) OR A paper copy in the mail If you have any lab test that is abnormal or we need to change your treatment, we will call you to review the results.  Follow-Up: At [facility], you and your health needs are our priority.  As part of our continuing mission to provide you with exceptional heart care, our providers are all part of one team.  This team includes your primary Cardiologist (physician) and Advanced Practice Providers or APPs (Physician Assistants and Nurse Practitioners) who all work together to provide you with the care you need, when you need it.  Your next appointment:    As Needed  Provider:   Gordy Bergamo, MD        We recommend signing up for the patient portal called MyChart.  Patients are able to view lab/test results, encounter notes, upcoming appointments, etc.  Non-urgent messages can be sent to your provider as well, go to [email or web address].

## 2024-08-10 ENCOUNTER — Encounter: Payer: Self-pay | Admitting: Cardiology

## 2024-08-11 ENCOUNTER — Other Ambulatory Visit (HOSPITAL_COMMUNITY): Payer: Self-pay

## 2024-08-11 MED ORDER — DAPAGLIFLOZIN PROPANEDIOL 10 MG PO TABS
10.0000 mg | ORAL_TABLET | Freq: Every day | ORAL | 0 refills | Status: AC
  Filled 2024-08-11: qty 30, 30d supply, fill #0

## 2024-08-17 ENCOUNTER — Other Ambulatory Visit (HOSPITAL_COMMUNITY): Payer: Self-pay

## 2024-08-17 MED ORDER — AMOXICILLIN 500 MG PO CAPS
2000.0000 mg | ORAL_CAPSULE | Freq: Once | ORAL | 0 refills | Status: AC | PRN
  Filled 2024-08-17: qty 20, 5d supply, fill #0
